# Patient Record
Sex: Female | Born: 1955 | Race: White | Hispanic: No | Marital: Married | Smoking: Former smoker
Health system: Southern US, Community
[De-identification: ages and names within clinical notes are randomized; demographics above are authoritative.]

## PROBLEM LIST (undated history)

## (undated) DIAGNOSIS — J45909 Unspecified asthma, uncomplicated: Secondary | ICD-10-CM

## (undated) DIAGNOSIS — I10 Essential (primary) hypertension: Secondary | ICD-10-CM

## (undated) DIAGNOSIS — N3281 Overactive bladder: Secondary | ICD-10-CM

## (undated) DIAGNOSIS — I2510 Atherosclerotic heart disease of native coronary artery without angina pectoris: Secondary | ICD-10-CM

## (undated) DIAGNOSIS — R112 Nausea with vomiting, unspecified: Secondary | ICD-10-CM

## (undated) DIAGNOSIS — Z9889 Other specified postprocedural states: Secondary | ICD-10-CM

## (undated) DIAGNOSIS — Z8616 Personal history of COVID-19: Secondary | ICD-10-CM

## (undated) DIAGNOSIS — T7840XA Allergy, unspecified, initial encounter: Secondary | ICD-10-CM

## (undated) DIAGNOSIS — I493 Ventricular premature depolarization: Secondary | ICD-10-CM

## (undated) DIAGNOSIS — R12 Heartburn: Secondary | ICD-10-CM

## (undated) DIAGNOSIS — R0600 Dyspnea, unspecified: Secondary | ICD-10-CM

## (undated) DIAGNOSIS — K219 Gastro-esophageal reflux disease without esophagitis: Secondary | ICD-10-CM

## (undated) HISTORY — DX: Atherosclerotic heart disease of native coronary artery without angina pectoris: I25.10

## (undated) HISTORY — DX: Overactive bladder: N32.81

## (undated) HISTORY — PX: TONSILLECTOMY: SUR1361

## (undated) HISTORY — PX: CHOLECYSTECTOMY: SHX55

## (undated) HISTORY — DX: Ventricular premature depolarization: I49.3

## (undated) HISTORY — DX: Personal history of COVID-19: Z86.16

## (undated) HISTORY — DX: Heartburn: R12

## (undated) HISTORY — PX: ABDOMINAL HYSTERECTOMY: SHX81

## (undated) HISTORY — DX: Allergy, unspecified, initial encounter: T78.40XA

## (undated) HISTORY — DX: Essential (primary) hypertension: I10

---

## 2002-08-24 HISTORY — PX: GALLBLADDER SURGERY: SHX652

## 2007-01-25 ENCOUNTER — Ambulatory Visit (HOSPITAL_COMMUNITY): Admission: RE | Admit: 2007-01-25 | Discharge: 2007-01-25 | Payer: Self-pay | Admitting: Family Medicine

## 2007-10-04 ENCOUNTER — Observation Stay (HOSPITAL_COMMUNITY): Admission: AD | Admit: 2007-10-04 | Discharge: 2007-10-05 | Payer: Self-pay

## 2007-10-27 ENCOUNTER — Ambulatory Visit (HOSPITAL_COMMUNITY): Admission: RE | Admit: 2007-10-27 | Discharge: 2007-10-27 | Payer: Self-pay | Admitting: Pulmonary Disease

## 2008-02-02 ENCOUNTER — Ambulatory Visit (HOSPITAL_COMMUNITY): Admission: RE | Admit: 2008-02-02 | Discharge: 2008-02-02 | Payer: Self-pay | Admitting: Family Medicine

## 2008-04-19 ENCOUNTER — Ambulatory Visit (HOSPITAL_COMMUNITY): Admission: RE | Admit: 2008-04-19 | Discharge: 2008-04-19 | Payer: Self-pay | Admitting: Pulmonary Disease

## 2008-08-24 HISTORY — PX: ESOPHAGOGASTRODUODENOSCOPY: SHX1529

## 2008-08-24 HISTORY — PX: COLONOSCOPY: SHX174

## 2008-10-12 ENCOUNTER — Encounter (INDEPENDENT_AMBULATORY_CARE_PROVIDER_SITE_OTHER): Payer: Self-pay | Admitting: General Surgery

## 2008-10-12 ENCOUNTER — Ambulatory Visit (HOSPITAL_COMMUNITY): Admission: RE | Admit: 2008-10-12 | Discharge: 2008-10-12 | Payer: Self-pay | Admitting: General Surgery

## 2009-02-26 ENCOUNTER — Ambulatory Visit (HOSPITAL_COMMUNITY): Admission: RE | Admit: 2009-02-26 | Discharge: 2009-02-26 | Payer: Self-pay | Admitting: Family Medicine

## 2010-03-07 ENCOUNTER — Ambulatory Visit (HOSPITAL_COMMUNITY): Admission: RE | Admit: 2010-03-07 | Discharge: 2010-03-07 | Payer: Self-pay | Admitting: Family Medicine

## 2010-04-18 ENCOUNTER — Ambulatory Visit (HOSPITAL_COMMUNITY)
Admission: RE | Admit: 2010-04-18 | Discharge: 2010-04-18 | Payer: Self-pay | Source: Home / Self Care | Admitting: Family Medicine

## 2010-04-30 ENCOUNTER — Ambulatory Visit: Payer: Self-pay | Admitting: Gastroenterology

## 2010-04-30 DIAGNOSIS — K219 Gastro-esophageal reflux disease without esophagitis: Secondary | ICD-10-CM

## 2010-04-30 DIAGNOSIS — R1011 Right upper quadrant pain: Secondary | ICD-10-CM

## 2010-04-30 DIAGNOSIS — Z91040 Latex allergy status: Secondary | ICD-10-CM

## 2010-05-01 ENCOUNTER — Encounter: Payer: Self-pay | Admitting: Gastroenterology

## 2010-05-21 ENCOUNTER — Ambulatory Visit (HOSPITAL_COMMUNITY)
Admission: RE | Admit: 2010-05-21 | Discharge: 2010-05-21 | Payer: Self-pay | Source: Home / Self Care | Admitting: Gastroenterology

## 2010-05-21 HISTORY — PX: EUS: SHX5427

## 2010-05-26 ENCOUNTER — Encounter (INDEPENDENT_AMBULATORY_CARE_PROVIDER_SITE_OTHER): Payer: Self-pay

## 2010-07-23 ENCOUNTER — Encounter (INDEPENDENT_AMBULATORY_CARE_PROVIDER_SITE_OTHER): Payer: Self-pay | Admitting: *Deleted

## 2010-09-14 ENCOUNTER — Encounter: Payer: Self-pay | Admitting: Family Medicine
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
## 2010-09-24 NOTE — Letter (Signed)
Summary: DR Dulce Sellar REFERRAL  DR OUTLAW REFERRAL   Imported By: Ave Filter 05/01/2010 08:00:12  _____________________________________________________________________  External Attachment:    Type:   Image     Comment:   External Document

## 2010-09-24 NOTE — Miscellaneous (Signed)
Summary: Orders Update  Clinical Lists Changes  Orders: Added new Test order of T-Hepatic Function [phone]) - Signed Added new Test order of T-Lipase [phone]) - Signed

## 2010-09-24 NOTE — Letter (Signed)
Summary: Plan of Care, Need to Discuss  [facility]  [address]   [place], [place] [address]   Phone: [phone]  Fax: [phone]    May 26, 2010  [facility] [address] [place], [place]  [address] January 28, 1956   Dear Ms. Indelicato,   We are writing this letter to inform you of treatment plans and/or discuss your plan of care.  We have tried several times to contact you; however, we have yet to reach you.  We ask that you please contact our office for follow-up on your gastrointestinal issues.  We can  be reached at [phone] to schedule an appointment, or to speak with someone regarding your health care needs.  Please do not neglect your health.   Sincerely,    Cloria Spring LPN  [facility] Ph: [phone]    Fax: [phone]

## 2010-09-24 NOTE — Letter (Signed)
Summary: Recall Office Visit  [facility]  [address]   [place], [place] [address]   Phone: [phone]  Fax: [phone]      July 23, 2010   [facility] [address] [place], [place]  [address] 04-03-1956   Dear [name],   According to our records, it is time for you to schedule a follow-up office visit with [place].   At your convenience, please call [phone] to schedule an office visit. If you have any questions, concerns, or feel that this letter is in error, we would appreciate your call.   Sincerely,    Diana Eves  [facility] Ph: [phone]   Fax: [phone]

## 2010-12-30 ENCOUNTER — Other Ambulatory Visit (HOSPITAL_COMMUNITY): Payer: Self-pay | Admitting: Family Medicine

## 2010-12-30 DIAGNOSIS — Z139 Encounter for screening, unspecified: Secondary | ICD-10-CM

## 2011-01-01 ENCOUNTER — Ambulatory Visit (HOSPITAL_COMMUNITY)
Admission: RE | Admit: 2011-01-01 | Discharge: 2011-01-01 | Disposition: A | Discharge status: Home / Self Care | Payer: 59 | Source: Ambulatory Visit | Attending: Family Medicine | Admitting: Family Medicine

## 2011-01-01 DIAGNOSIS — M899 Disorder of bone, unspecified: Secondary | ICD-10-CM | POA: Insufficient documentation

## 2011-01-01 DIAGNOSIS — M949 Disorder of cartilage, unspecified: Secondary | ICD-10-CM | POA: Insufficient documentation

## 2011-01-01 DIAGNOSIS — Z139 Encounter for screening, unspecified: Secondary | ICD-10-CM

## 2011-01-06 NOTE — H&P (Signed)
[name], [name]               ACCOUNT NO.:  [id number]   MEDICAL RECORD NO.:  [id number]          PATIENT TYPE:  INP   LOCATION:  A220                          FACILITY:  [facility]   PHYSICIAN:  Dorris Singh, DO    DATE OF BIRTH:  August 18, 1956   DATE OF ADMISSION:  10/04/2007  DATE OF DISCHARGE:  LH                              HISTORY & PHYSICAL   The patient is a 55 year old Caucasian female who presented to her  primary care physician's office, Dr. Phillips Odor at [facility], with a 7-day history of cold-like symptoms.  She says that  she has been taking over-the-counter medications and felt after a week  that her symptoms were getting worse.  She was having maxillary sinus  pain that radiates to both sides of the jaw, also runny nose and a  headache.  She stated that last night while she was at home, she noticed  a crushing chest pain rated a 9/10, lasting only for a few minutes  intermittently, waxing and waning. She denies any nausea, vomiting or  diaphoresis but stated that it lasted from 3:00 a.m. to 6:00 a.m.  At  that point in time, she went to sleep and then woke up and called Dr.  Phillips Odor and was seen by him. Dr. Phillips Odor presented the patient to me  today. After speaking with Dr. Rosalia Hammers in the ED, decided that she has a few  issues going on.  One, she may have a bronchitis or sinusitis which she  has suffered with for over 10 days and also to find out what is going on  with her chest pain, is this truly cardiac in nature.  So the plan would  be to admit her to the service of [facility] directly and obtain her  studies this way and have [facility] see her in the morning  if not more emergently.   PAST MEDICAL HISTORY:  Significant for incontinence.   ALLERGIES:  No known drug allergies.   PAST SURGICAL HISTORY:  Significant for:  1. Tonsillectomy.  2. Complete hysterectomy.  3. Cholecystectomy.   SOCIAL HISTORY:  She smokes about a pack to a  pack and one-half per day.  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   MEDICATIONS:  1. Premarin, she does not give [place] a dose.  2. Detrol LA 4 mg.  3. Multivitamins.  4. Citrucel.  5. Osteo-Flex.  No doses for these things.   REVIEW OF SYSTEMS:  Positive for rhinorrhea, rhinitis, jaw pain, cough,  chest pain, headache, weakness, incontinence.   PHYSICAL EXAMINATION:  VITAL SIGNS:  Temperature is 97.4, pulse 70,  respirations 20, blood pressure 142/74.  GENERAL:  This is a well-developed, well-nourished 55 year old woman who  is in no acute distress.  HEENT:  Head is normocephalic, atraumatic.  Eyes: PERRLA.  EOMI.  No  sclera icterus or conjunctival injection.  Nose: Turbinates are moist.  Mouth:  No erythema or exudate noted.  Sinuses: There is pressure with  palpation of the maxillary sinus.  NECK:  Supple.  No lymphadenopathy or tracheal  deviation.  HEART:  Regular rate and rhythm.  LUNGS:  Clear to auscultation bilaterally.  No wheezes, rales or  rhonchi.  ABDOMEN:  Soft, nontender, nondistended. Bowel sounds in all four  quadrants  EXTREMITIES:  Positive pulses.  No ecchymosis, cyanosis, or edema.   LABORATORY DATA:  Her current labs are as follows. Her CBC is within  normal limits.  Her chemistries within normal limits.  However, her  LFTs, alkaline phosphatase is 118, AST 104, ALT 108.  Her first set of  cardiac enzymes are normal.   ASSESSMENT AND PLAN:  1. Bronchitis and sinusitis.  2. Chest pain, atypical.  3. Elevated liver enzymes.   PLAN:  The patient is a direct admit from Dr. [name].  Currently his orders include starting the patient on IV Avelox.  We will  go ahead and continue that for 24 hours.  Currently the patient does not  have a white count, but she is exhibiting symptoms of bronchitis and  sinusitis, so we will continue with that plan for at least the next 24  hours.  Will go ahead and continue to monitor  her cardiac enzymes.  There is an EKG ordered that is pending as well as one in the morning,  and, if anything is changed, will contact [facility] for  emergent evaluation.   Elevated liver enzymes.  Will go ahead and repeat those in the morning  to see how she does. If there is any reason to pursue an inpatient  workup, will continue with that. If not, will possibly have an  outpatient workup. The patient has been placed on DVT and GI  prophylaxis. Will place her on her home medications and will continue to  monitor and change therapy as needed.      Dorris Singh, DO  Electronically Signed     CB/MEDQ  D:  10/04/2007  T:  10/04/2007  Job:  [id number]

## 2011-01-06 NOTE — H&P (Signed)
[name], [name]               ACCOUNT NO.:  [id number]   MEDICAL RECORD NO.:  [id number]          PATIENT TYPE:  AMB   LOCATION:  DAY                           FACILITY:  [facility]   PHYSICIAN:  Dalia Heading, M.D.  DATE OF BIRTH:  [birth year]   DATE OF ADMISSION:  DATE OF DISCHARGE:  LH                              HISTORY & PHYSICAL   CHIEF COMPLAINT:  Need for screening colonoscopy, GERD.   HISTORY OF PRESENT ILLNESS:  The patient is a 56 year old white female  who was referred for endoscopic evaluation.  She needs a colonoscopy for  screening purposes and an EGD for intractable GERD.  Nexium has been  somewhat helpful and she takes this most of the day.  It has been  present for many years.  No abdominal pain, weight loss, nausea,  vomiting, diarrhea, constipation, melena, or hematochezia.  She has  never had a colonoscopy.  There is no family history of colon carcinoma.   PAST MEDICAL HISTORY:  Bladder dysfunction and GERD.   PAST SURGICAL HISTORY:  Hysterectomy, cholecystectomy, and ankle  surgery.   CURRENT MEDICATIONS:  Detrol LA, Nexium, Nasonex spray, and calcium  supplements.   ALLERGIES:  No known drug allergies.   REVIEW OF SYSTEMS:  Noncontributory.   PHYSICAL EXAMINATION:  GENERAL:  The patient is a well-developed, well-  nourished white female in no acute distress.  LUNGS:  Clear to auscultation with equal breath sounds bilaterally.  HEART:  Regular rate and rhythm without S3, S4, or murmurs.  ABDOMEN:  Soft, nontender, and nondistended.  No hepatosplenomegaly or  masses are noted.  RECTAL:  Deferred to the procedure.   IMPRESSION:  Gastroesophageal reflux disease and need for screening  colonoscopy.   PLAN:  The patient is scheduled for an EGD and colonoscopy on October 12, 2008.  The risks and benefits of the procedure including bleeding  and perforation were fully explained to the patient, gave informed  consent.      Dalia Heading, M.D.  Electronically Signed     MAJ/MEDQ  D:  09/20/2008  T:  09/21/2008  Job:  [id number]   cc:   Corrie Mckusick, M.D.  Fax: [phone]

## 2011-01-06 NOTE — H&P (Signed)
[name], [name]               ACCOUNT NO.:  [id number]   MEDICAL RECORD NO.:  [id number]          PATIENT TYPE:  AMB   LOCATION:  DAY                           [facility]ILITY:  [facility]   PHYSICIAN:  Dalia Heading, M.D.  DATE OF BIRTH:  July 06, 1956   DATE OF ADMISSION:  DATE OF DISCHARGE:  LH                              HISTORY & PHYSICAL   CHIEF COMPLAINT:  Need for screening colonoscopy, GERD.   HISTORY OF PRESENT ILLNESS:  The patient is a 55 year old white female  who was referred for endoscopic evaluation.  She needs a colonoscopy for  screening purposes and an EGD for intractable GERD.  Nexium has been  somewhat helpful and she takes this most of the day.  It has been  present for many years.  No abdominal pain, weight loss, nausea,  vomiting, diarrhea, constipation, melena, or hematochezia.  She has  never had a colonoscopy.  There is no family history of colon carcinoma.   PAST MEDICAL HISTORY:  Bladder dysfunction and GERD.   PAST SURGICAL HISTORY:  Hysterectomy, cholecystectomy, and ankle  surgery.   CURRENT MEDICATIONS:  Detrol LA, Nexium, Nasonex spray, and calcium  supplements.   ALLERGIES:  No known drug allergies.   REVIEW OF SYSTEMS:  Noncontributory.   PHYSICAL EXAMINATION:  GENERAL:  The patient is a well-developed, well-  nourished white female in no acute distress.  LUNGS:  Clear to auscultation with equal breath sounds bilaterally.  HEART:  Regular rate and rhythm without S3, S4, or murmurs.  ABDOMEN:  Soft, nontender, and nondistended.  No hepatosplenomegaly or  masses are noted.  RECTAL:  Deferred to the procedure.   IMPRESSION:  Gastroesophageal reflux disease and need for screening  colonoscopy.   PLAN:  The patient is scheduled for an EGD and colonoscopy on October 12, 2008.  The risks and benefits of the procedure including bleeding  and perforation were fully explained to the patient, gave informed  consent.      Dalia Heading,  M.D.     MAJ/MEDQ  D:  09/20/2008  T:  09/21/2008  Job:  [id number]   cc:   Corrie Mckusick, M.D.  Fax: [phone]

## 2011-01-06 NOTE — Discharge Summary (Signed)
[name], [name]               ACCOUNT NO.:  [id number]   MEDICAL RECORD NO.:  [id number]          PATIENT TYPE:  INP   LOCATION:  A220                          FACILITY:  [facility]   PHYSICIAN:  Dorris Singh, DO    DATE OF BIRTH:  1956-04-30   DATE OF ADMISSION:  10/04/2007  DATE OF DISCHARGE:  02/11/2009LH                               DISCHARGE SUMMARY   ADMISSION DIAGNOSES:  1. Bronchitis, sinusitis,.  2. Chest pain.  3. Elevated liver enzymes.   DISCHARGE DIAGNOSES:  1. Sinusitis.  2. Elevated liver enzymes.  3. Abnormal chest CT.  4. Chest pain, atypical.   CONSULTATIONS:  Dr. [facility] for chest pain.   STUDIES:  CT angiogram on October 04, 2007.   IMPRESSION:  No evidence of pulmonary embolus or acute thoracic process.  COPD with few scattered tiny nodule foci and minimal foci of infiltrates  in both lungs as above.  The largest nodule 5 mm in diameter right  middle lobe.  Follow-up CT recommended in 6-9 months to confirm  stability in this smoker.   HISTORY AND PHYSICAL:  The patient's H&P was done by Dr. Dorris Singh.  Please refer, but to summarize, the patient is a 51-year woman  who presented Dr. [name] with a history of atypical chest  pain.  It was determined that she would go ahead and be admitted.  She  also had some other symptoms of an upper respiratory infection.  She was  then placed on the monitor for 24 hours.  Dr. Domingo Sep came to see the  patient.  Prior to being seen, she did not have any further episodes.  She also was scheduled for a CT with the above findings.  Dr. Domingo Sep  saw her today.  Followed her blood work and cardiac enzymes have been  negative except for elevated liver enzymes which were trending down.  At  this point in time, after discussion with Dr. Domingo Sep today that the  patient could be discharged to home, but after discharge will have her  come to [facility] and set up a  stress test for the next  few days.  Also, it was recommended to the patient due to her CT and her  chronic smoking, that she be followed up by Dr. Juanetta Gosling outpatient and  recommended the patient stop smoking at this point in time.   DISCHARGE MEDICATIONS:  She did not give a doses of her medications, so  we have:  1. Premarin with no dose.  2. Detrol LA 4 mg one p.o. daily.  3. Multivitamin.  4. Citracal.  5. __________.  6. She has new medications which include.      a.     Lopressor 12.5 mg p.o. b.i.d.      b.     Avelox 400 mg p.o. daily.      c.     Maxifed one p.o. b.i.d.      d.     Hycodan one teaspoon p.o. q. 4-6 hours p.r.n. cough.   FOLLOW-UP INSTRUCTIONS:  She is to increase her  activity slowly.  She is  to be on a low heart healthy diet.  She is to stop smoking.  She is to  see Dr. Domingo Sep now for stress test, also to follow up with Dr. Juanetta Gosling  for COPD and with Dr. Phillips Odor for sinusitis and elevated liver enzymes  and to start her new medications as recommended      Dorris Singh, DO  Electronically Signed     CB/MEDQ  D:  10/05/2007  T:  10/05/2007  Job:  [id number]

## 2011-01-09 NOTE — Procedures (Signed)
[name], [name]               ACCOUNT NO.:  [id number]   MEDICAL RECORD NO.:  [id number]          PATIENT TYPE:  OUT   LOCATION:  RESP                          FACILITY:  [facility]   PHYSICIAN:  Edward L. Juanetta Gosling, M.D.DATE OF BIRTH:  29-Jan-1956   DATE OF PROCEDURE:  DATE OF DISCHARGE:                            PULMONARY FUNCTION TEST   1. Spirometry shows no ventilatory defect but does show some evidence      of airflow obstruction, most marked in the smaller airways.  2. Lung volumes are normal.  3. DLCO is normal.  4. Arterial blood gases normal.  5. There is no significant bronchodilator response.      Edward L. Juanetta Gosling, M.D.  Electronically Signed     ELH/MEDQ  D:  11/02/2007  T:  11/03/2007  Job:  [id number]

## 2011-02-06 ENCOUNTER — Other Ambulatory Visit (HOSPITAL_COMMUNITY): Payer: Self-pay | Admitting: Internal Medicine

## 2011-02-11 ENCOUNTER — Encounter (HOSPITAL_COMMUNITY): Payer: 59

## 2011-04-28 ENCOUNTER — Other Ambulatory Visit (HOSPITAL_COMMUNITY): Payer: Self-pay | Admitting: Family Medicine

## 2011-04-28 DIAGNOSIS — Z139 Encounter for screening, unspecified: Secondary | ICD-10-CM

## 2011-04-30 ENCOUNTER — Ambulatory Visit (HOSPITAL_COMMUNITY)
Admission: RE | Admit: 2011-04-30 | Discharge: 2011-04-30 | Disposition: A | Discharge status: Home / Self Care | Payer: 59 | Source: Ambulatory Visit | Attending: Family Medicine | Admitting: Family Medicine

## 2011-04-30 DIAGNOSIS — Z1231 Encounter for screening mammogram for malignant neoplasm of breast: Secondary | ICD-10-CM | POA: Insufficient documentation

## 2011-04-30 DIAGNOSIS — Z139 Encounter for screening, unspecified: Secondary | ICD-10-CM

## 2011-05-05 ENCOUNTER — Ambulatory Visit (INDEPENDENT_AMBULATORY_CARE_PROVIDER_SITE_OTHER): Payer: 59 | Admitting: Urology

## 2011-05-05 DIAGNOSIS — N393 Stress incontinence (female) (male): Secondary | ICD-10-CM

## 2011-05-15 LAB — CARDIAC PANEL(CRET KIN+CKTOT+MB+TROPI)
CK, MB: 0.9
CK, MB: 1.5
Total CK: 66
Troponin I: 0.03
Troponin I: 0.03
Troponin I: 0.03

## 2011-05-15 LAB — COMPREHENSIVE METABOLIC PANEL
ALT: 108 — ABNORMAL HIGH
Alkaline Phosphatase: 117
Alkaline Phosphatase: 118 — ABNORMAL HIGH
BUN: 11
Calcium: 8.9
Calcium: 8.9
Chloride: 106
Chloride: 107
Creatinine, Ser: 0.67
Creatinine, Ser: 0.86
GFR calc non Af Amer: >60
Glucose, Bld: 87
Glucose, Bld: 97
Potassium: 4.1
Total Bilirubin: 0.4
Total Bilirubin: 0.5
Total Protein: 5.7 — ABNORMAL LOW

## 2011-05-15 LAB — DIFFERENTIAL
Basophils Relative: 0
Basophils Relative: 0
Eosinophils Absolute: 0.2
Eosinophils Absolute: 0.3
Eosinophils Relative: 3
Eosinophils Relative: 4
Lymphocytes Relative: 20
Lymphs Abs: 1.4
Lymphs Abs: 1.6
Monocytes Absolute: 0.7
Neutro Abs: 4.7
Neutro Abs: 5.4
Neutrophils Relative %: 68
Neutrophils Relative %: 68

## 2011-05-15 LAB — CBC
Hemoglobin: 15.2 — ABNORMAL HIGH
MCV: 89.4
Platelets: 204
RBC: 4.85
RDW: 12.8

## 2011-05-15 LAB — LIPID PANEL
Cholesterol: 190
Triglycerides: 114

## 2011-05-18 LAB — BLOOD GAS, ARTERIAL
Acid-Base Excess: 1.1
TCO2: 22
pCO2 arterial: 40.4

## 2011-08-04 ENCOUNTER — Ambulatory Visit (INDEPENDENT_AMBULATORY_CARE_PROVIDER_SITE_OTHER): Payer: 59 | Admitting: Urology

## 2011-08-04 DIAGNOSIS — R350 Frequency of micturition: Secondary | ICD-10-CM

## 2011-08-04 DIAGNOSIS — N393 Stress incontinence (female) (male): Secondary | ICD-10-CM

## 2011-08-04 DIAGNOSIS — R3915 Urgency of urination: Secondary | ICD-10-CM

## 2011-09-16 ENCOUNTER — Other Ambulatory Visit (HOSPITAL_COMMUNITY): Payer: Self-pay | Admitting: Family Medicine

## 2011-09-16 DIAGNOSIS — R109 Unspecified abdominal pain: Secondary | ICD-10-CM

## 2011-09-16 DIAGNOSIS — N200 Calculus of kidney: Secondary | ICD-10-CM

## 2011-09-18 ENCOUNTER — Inpatient Hospital Stay (HOSPITAL_COMMUNITY): Admission: RE | Admit: 2011-09-18 | Payer: 59 | Source: Ambulatory Visit

## 2011-09-21 ENCOUNTER — Ambulatory Visit (HOSPITAL_COMMUNITY)
Admission: RE | Admit: 2011-09-21 | Discharge: 2011-09-21 | Disposition: A | Discharge status: Home / Self Care | Payer: 59 | Source: Ambulatory Visit | Attending: Family Medicine | Admitting: Family Medicine

## 2011-09-21 DIAGNOSIS — R112 Nausea with vomiting, unspecified: Secondary | ICD-10-CM | POA: Insufficient documentation

## 2011-09-21 DIAGNOSIS — K7689 Other specified diseases of liver: Secondary | ICD-10-CM | POA: Insufficient documentation

## 2011-09-21 DIAGNOSIS — N200 Calculus of kidney: Secondary | ICD-10-CM

## 2011-09-21 DIAGNOSIS — R1032 Left lower quadrant pain: Secondary | ICD-10-CM | POA: Insufficient documentation

## 2011-09-21 DIAGNOSIS — R109 Unspecified abdominal pain: Secondary | ICD-10-CM

## 2012-01-19 ENCOUNTER — Ambulatory Visit: Payer: 59 | Admitting: Urology

## 2012-04-27 ENCOUNTER — Other Ambulatory Visit (HOSPITAL_COMMUNITY): Payer: Self-pay | Admitting: Family Medicine

## 2012-04-27 DIAGNOSIS — J984 Other disorders of lung: Secondary | ICD-10-CM

## 2012-04-29 ENCOUNTER — Other Ambulatory Visit (HOSPITAL_COMMUNITY): Payer: 59

## 2012-05-02 ENCOUNTER — Other Ambulatory Visit (HOSPITAL_COMMUNITY): Payer: Self-pay | Admitting: Family Medicine

## 2012-05-02 ENCOUNTER — Ambulatory Visit (HOSPITAL_COMMUNITY)
Admission: RE | Admit: 2012-05-02 | Discharge: 2012-05-02 | Disposition: A | Discharge status: Home / Self Care | Payer: 59 | Source: Ambulatory Visit | Attending: Family Medicine | Admitting: Family Medicine

## 2012-05-02 DIAGNOSIS — J984 Other disorders of lung: Secondary | ICD-10-CM | POA: Insufficient documentation

## 2012-05-02 DIAGNOSIS — Z139 Encounter for screening, unspecified: Secondary | ICD-10-CM

## 2012-05-06 ENCOUNTER — Ambulatory Visit (HOSPITAL_COMMUNITY)
Admission: RE | Admit: 2012-05-06 | Discharge: 2012-05-06 | Disposition: A | Discharge status: Home / Self Care | Payer: 59 | Source: Ambulatory Visit | Attending: Family Medicine | Admitting: Family Medicine

## 2012-05-06 DIAGNOSIS — Z139 Encounter for screening, unspecified: Secondary | ICD-10-CM

## 2012-05-06 DIAGNOSIS — Z1231 Encounter for screening mammogram for malignant neoplasm of breast: Secondary | ICD-10-CM | POA: Insufficient documentation

## 2013-07-13 ENCOUNTER — Other Ambulatory Visit (HOSPITAL_COMMUNITY): Payer: Self-pay | Admitting: Family Medicine

## 2013-07-13 DIAGNOSIS — Z139 Encounter for screening, unspecified: Secondary | ICD-10-CM

## 2013-07-18 ENCOUNTER — Ambulatory Visit (HOSPITAL_COMMUNITY)
Admission: RE | Admit: 2013-07-18 | Discharge: 2013-07-18 | Disposition: A | Discharge status: Home / Self Care | Payer: 59 | Source: Ambulatory Visit | Attending: Family Medicine | Admitting: Family Medicine

## 2013-07-18 DIAGNOSIS — Z1231 Encounter for screening mammogram for malignant neoplasm of breast: Secondary | ICD-10-CM | POA: Insufficient documentation

## 2013-07-18 DIAGNOSIS — Z139 Encounter for screening, unspecified: Secondary | ICD-10-CM

## 2013-12-04 ENCOUNTER — Other Ambulatory Visit (HOSPITAL_COMMUNITY): Payer: Self-pay | Admitting: Family Medicine

## 2013-12-04 ENCOUNTER — Ambulatory Visit (HOSPITAL_COMMUNITY)
Admission: RE | Admit: 2013-12-04 | Discharge: 2013-12-04 | Disposition: A | Discharge status: Home / Self Care | Payer: 59 | Source: Ambulatory Visit | Attending: Family Medicine | Admitting: Family Medicine

## 2013-12-04 DIAGNOSIS — R079 Chest pain, unspecified: Secondary | ICD-10-CM | POA: Insufficient documentation

## 2013-12-04 DIAGNOSIS — R05 Cough: Secondary | ICD-10-CM

## 2013-12-04 DIAGNOSIS — R059 Cough, unspecified: Secondary | ICD-10-CM | POA: Insufficient documentation

## 2013-12-04 DIAGNOSIS — Z87891 Personal history of nicotine dependence: Secondary | ICD-10-CM | POA: Insufficient documentation

## 2013-12-27 ENCOUNTER — Other Ambulatory Visit (HOSPITAL_COMMUNITY): Payer: Self-pay | Admitting: Family Medicine

## 2013-12-27 DIAGNOSIS — R9389 Abnormal findings on diagnostic imaging of other specified body structures: Secondary | ICD-10-CM

## 2013-12-29 ENCOUNTER — Other Ambulatory Visit (HOSPITAL_COMMUNITY): Payer: 59

## 2014-01-02 ENCOUNTER — Ambulatory Visit (HOSPITAL_COMMUNITY)
Admission: RE | Admit: 2014-01-02 | Discharge: 2014-01-02 | Disposition: A | Discharge status: Home / Self Care | Payer: 59 | Source: Ambulatory Visit | Attending: Family Medicine | Admitting: Family Medicine

## 2014-01-02 DIAGNOSIS — J438 Other emphysema: Secondary | ICD-10-CM | POA: Insufficient documentation

## 2014-01-02 DIAGNOSIS — K449 Diaphragmatic hernia without obstruction or gangrene: Secondary | ICD-10-CM | POA: Insufficient documentation

## 2014-01-02 DIAGNOSIS — Z87891 Personal history of nicotine dependence: Secondary | ICD-10-CM | POA: Insufficient documentation

## 2014-01-02 DIAGNOSIS — J40 Bronchitis, not specified as acute or chronic: Secondary | ICD-10-CM | POA: Insufficient documentation

## 2014-01-02 DIAGNOSIS — R911 Solitary pulmonary nodule: Secondary | ICD-10-CM | POA: Insufficient documentation

## 2014-01-02 DIAGNOSIS — K7689 Other specified diseases of liver: Secondary | ICD-10-CM | POA: Insufficient documentation

## 2014-01-02 DIAGNOSIS — R9389 Abnormal findings on diagnostic imaging of other specified body structures: Secondary | ICD-10-CM

## 2014-01-08 ENCOUNTER — Encounter: Payer: Self-pay | Admitting: Emergency Medicine

## 2014-01-08 ENCOUNTER — Ambulatory Visit (INDEPENDENT_AMBULATORY_CARE_PROVIDER_SITE_OTHER): Payer: 59 | Admitting: Emergency Medicine

## 2014-01-08 VITALS — BP 140/86 | HR 83 | Ht 64.5 in | Wt 177.0 lb

## 2014-01-08 DIAGNOSIS — R9389 Abnormal findings on diagnostic imaging of other specified body structures: Secondary | ICD-10-CM | POA: Insufficient documentation

## 2014-01-08 DIAGNOSIS — J479 Bronchiectasis, uncomplicated: Secondary | ICD-10-CM | POA: Insufficient documentation

## 2014-01-08 NOTE — Progress Notes (Signed)
Subjective:    Patient ID: [name], female    DOB: 08-07-1956, 58 y.o.   MRN: [id number]  HPI 58 yo former former smoker, hx of HTN, GERD, allergies. She is referred today for an abnormal CT scan of the chest, most recently on 01/02/14. Shows mild peribronchial thickening w some scattered peripheral interstitial changes, microvascular nodular changes, mild bronchiectasis, RML basilar scarring, stable RML nodule. Very little change compared with prior scan on 05/02/12 but more so than 2009. She has allergies with sensitivity to grasses, pollens.    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 No history of auto-immune disease. Distant relative had SLE.  She was dx with PNA and bronchitis frequently as a youngster.   Spirometry 2012 at [facility] > AFL especially small airways.    Review of Systems  Constitutional: Negative for fever and unexpected weight change.  HENT: Positive for congestion, sinus pressure, sneezing and sore throat. Negative for dental problem, ear pain, nosebleeds, postnasal drip, rhinorrhea and trouble swallowing.   Eyes: Negative for redness and itching.  Respiratory: Positive for cough and shortness of breath. Negative for chest tightness and wheezing.   Cardiovascular: Negative for palpitations and leg swelling.  Gastrointestinal: Positive for abdominal pain and abdominal distention. Negative for nausea and vomiting.  Genitourinary: Negative for dysuria.  Musculoskeletal: Negative for joint swelling.  Skin: Negative for rash.  Neurological: Negative for headaches.  Hematological: Does not bruise/bleed easily.  Psychiatric/Behavioral: Negative for dysphoric mood. The patient is not nervous/anxious.    Past Medical History  Diagnosis Date  . High blood pressure   . Allergy   . Heartburn      Family History  Problem  Relation Age of Onset  . Heart disease Maternal Grandfather   . Heart disease Paternal Grandmother   . Cancer Mother     lyphoma  . Allergies Mother   . Allergies Brother      History   Social History  . Marital Status: Married    Spouse Name: N/A    Number of Children: N/A  . Years of Education: N/A   Occupational History  . Not on file.   Social History Main Topics  . Smoking status: Former Smoker -- 1.50 packs/day for 30 years    Types: Cigarettes    Quit date: 05/24/2009  . Smokeless tobacco: Not on file  . Alcohol Use: No  . Drug Use: No  . Sexual Activity: Not on file   Other Topics Concern  . Not on file   Social History Narrative  . No narrative on file     Allergies  Allergen Reactions  . Latex   . Prednisone     Vomit & extreme headache     No outpatient prescriptions prior to visit.   No facility-administered medications prior to visit.        Objective:   Physical Exam  Filed Vitals:   01/08/14 1541  BP: 140/86  Pulse: 83  Height: 5' 4.5" (1.638 m)  Weight: 177 lb (80.287 kg)  SpO2: 96%   Gen: Pleasant, well-nourished, in no distress,  normal affect  ENT: No lesions,  mouth clear,  oropharynx clear, no postnasal drip  Neck: No JVD, no TMG, no carotid bruits  Lungs: No use of accessory muscles, clear without rales or rhonchi  Cardiovascular: RRR, heart sounds normal, no murmur or gallops, no peripheral edema  Musculoskeletal: No deformities, no cyanosis or clubbing  Neuro: alert, non focal  Skin: Warm, no lesions or rashes      Assessment & Plan:  Abnormal CT scan, chest Micronodular disease with some bronchiectasis, RML scar, stable RML 62mm nodule. 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 Will start the eval with FOB and BAL. Will likely need PFT to compare with 2012. May need to consider auto-immune workup depending on the cx results.  - will set up FOB for 5/27 or 28

## 2014-01-08 NOTE — Assessment & Plan Note (Signed)
Micronodular disease with some bronchiectasis, RML scar, stable RML 71mm nodule. 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 Will start the eval with FOB and BAL. Will likely need PFT to compare with 2012. May need to consider auto-immune workup depending on the cx results.  - will set up FOB for 5/27 or 28

## 2014-01-08 NOTE — Patient Instructions (Signed)
We will perform a bronchoscopy to allow cultures of your lungs.  We will review your former breathing tests and repeat PFT to compare.  Follow with Dr Lamonte Sakai in 1 month

## 2014-01-17 ENCOUNTER — Telehealth: Payer: Self-pay | Admitting: *Deleted

## 2014-01-17 NOTE — Telephone Encounter (Signed)
Spoke with the pt and notified of appt time change  She verbalized understanding and will keep this appt

## 2014-01-17 NOTE — Telephone Encounter (Signed)
Bronchoscopy 01/23/14 appt time changed to 10am Original appt time was for 730am--Per Dr Lamonte Sakai, time changed.   LMOM x 1 for pt to return call to make aware.

## 2014-01-18 NOTE — Telephone Encounter (Signed)
Pt aware of changes in appointment time per RB. RB has placed change of time in his phone. Nothing further is needed

## 2014-01-23 ENCOUNTER — Ambulatory Visit (HOSPITAL_COMMUNITY): Admission: RE | Admit: 2014-01-23 | Payer: 59 | Source: Ambulatory Visit | Admitting: Emergency Medicine

## 2014-01-23 ENCOUNTER — Encounter (HOSPITAL_COMMUNITY): Admission: RE | Payer: Self-pay | Source: Ambulatory Visit

## 2014-01-23 ENCOUNTER — Encounter (HOSPITAL_COMMUNITY): Payer: 59

## 2014-01-23 ENCOUNTER — Encounter (HOSPITAL_COMMUNITY): Payer: Self-pay | Admitting: Respiratory Therapy

## 2014-01-23 ENCOUNTER — Encounter (HOSPITAL_COMMUNITY)
Admission: RE | Disposition: A | Discharge status: Home / Self Care | Payer: Self-pay | Source: Ambulatory Visit | Attending: Emergency Medicine

## 2014-01-23 ENCOUNTER — Ambulatory Visit (HOSPITAL_COMMUNITY)
Admission: RE | Admit: 2014-01-23 | Discharge: 2014-01-23 | Disposition: A | Discharge status: Home / Self Care | Payer: 59 | Source: Ambulatory Visit | Attending: Emergency Medicine | Admitting: Emergency Medicine

## 2014-01-23 DIAGNOSIS — Z91040 Latex allergy status: Secondary | ICD-10-CM | POA: Diagnosis not present

## 2014-01-23 DIAGNOSIS — R918 Other nonspecific abnormal finding of lung field: Secondary | ICD-10-CM | POA: Insufficient documentation

## 2014-01-23 DIAGNOSIS — Z87891 Personal history of nicotine dependence: Secondary | ICD-10-CM | POA: Insufficient documentation

## 2014-01-23 DIAGNOSIS — R9389 Abnormal findings on diagnostic imaging of other specified body structures: Secondary | ICD-10-CM

## 2014-01-23 DIAGNOSIS — J479 Bronchiectasis, uncomplicated: Secondary | ICD-10-CM | POA: Diagnosis not present

## 2014-01-23 DIAGNOSIS — Z888 Allergy status to other drugs, medicaments and biological substances status: Secondary | ICD-10-CM | POA: Diagnosis not present

## 2014-01-23 HISTORY — PX: VIDEO BRONCHOSCOPY: SHX5072

## 2014-01-23 SURGERY — VIDEO BRONCHOSCOPY WITHOUT FLUORO
Anesthesia: Moderate Sedation | Laterality: Bilateral

## 2014-01-23 MED ORDER — SODIUM CHLORIDE 0.9 % IV SOLN
INTRAVENOUS | Status: DC
Start: 2014-01-23 — End: 2014-01-23
  Administered 2014-01-23: 10:00:00 via INTRAVENOUS

## 2014-01-23 MED ORDER — FENTANYL CITRATE 0.05 MG/ML IJ SOLN
INTRAMUSCULAR | Status: AC
  Filled 2014-01-23: qty 4

## 2014-01-23 MED ORDER — LIDOCAINE HCL 2 % EX GEL
CUTANEOUS | Status: DC | PRN
  Administered 2014-01-23: 2

## 2014-01-23 MED ORDER — FENTANYL CITRATE 0.05 MG/ML IJ SOLN
INTRAMUSCULAR | Status: DC | PRN
  Administered 2014-01-23 (×2): 25 ug via INTRAVENOUS
  Administered 2014-01-23: 50 ug via INTRAVENOUS

## 2014-01-23 MED ORDER — PHENYLEPHRINE HCL 0.25 % NA SOLN: 1.0000 | Freq: Four times a day (QID) | NASAL | Status: DC | PRN

## 2014-01-23 MED ORDER — LIDOCAINE HCL 2 % EX GEL: Freq: Once | CUTANEOUS | Status: DC

## 2014-01-23 MED ORDER — MIDAZOLAM HCL 10 MG/2ML IJ SOLN
INTRAMUSCULAR | Status: AC
  Filled 2014-01-23: qty 4

## 2014-01-23 MED ORDER — LIDOCAINE HCL 1 % IJ SOLN
INTRAMUSCULAR | Status: DC | PRN
  Administered 2014-01-23: 5 mL

## 2014-01-23 MED ORDER — MIDAZOLAM HCL 10 MG/2ML IJ SOLN
INTRAMUSCULAR | Status: DC | PRN
Start: 2014-01-23 — End: 2014-01-23
  Administered 2014-01-23: 2 mg via INTRAVENOUS
  Administered 2014-01-23: 3 mg via INTRAVENOUS

## 2014-01-23 MED ORDER — PHENYLEPHRINE HCL 0.25 % NA SOLN
NASAL | Status: DC | PRN
  Administered 2014-01-23: 2 via NASAL

## 2014-01-23 NOTE — H&P (View-Only) (Signed)
Subjective:    Patient ID: [name], female    DOB: 08-07-1956, 58 y.o.   MRN: [id number]  HPI 58 yo former former smoker, hx of HTN, GERD, allergies. She is referred today for an abnormal CT scan of the chest, most recently on 01/02/14. Shows mild peribronchial thickening w some scattered peripheral interstitial changes, microvascular nodular changes, mild bronchiectasis, RML basilar scarring, stable RML nodule. Very little change compared with prior scan on 05/02/12 but more so than 2009. She has allergies with sensitivity to grasses, pollens.    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 No history of auto-immune disease. Distant relative had SLE.  She was dx with PNA and bronchitis frequently as a youngster.   Spirometry 2012 at [facility] > AFL especially small airways.    Review of Systems  Constitutional: Negative for fever and unexpected weight change.  HENT: Positive for congestion, sinus pressure, sneezing and sore throat. Negative for dental problem, ear pain, nosebleeds, postnasal drip, rhinorrhea and trouble swallowing.   Eyes: Negative for redness and itching.  Respiratory: Positive for cough and shortness of breath. Negative for chest tightness and wheezing.   Cardiovascular: Negative for palpitations and leg swelling.  Gastrointestinal: Positive for abdominal pain and abdominal distention. Negative for nausea and vomiting.  Genitourinary: Negative for dysuria.  Musculoskeletal: Negative for joint swelling.  Skin: Negative for rash.  Neurological: Negative for headaches.  Hematological: Does not bruise/bleed easily.  Psychiatric/Behavioral: Negative for dysphoric mood. The patient is not nervous/anxious.    Past Medical History  Diagnosis Date  . High blood pressure   . Allergy   . Heartburn      Family History  Problem  Relation Age of Onset  . Heart disease Maternal Grandfather   . Heart disease Paternal Grandmother   . Cancer Mother     lyphoma  . Allergies Mother   . Allergies Brother      History   Social History  . Marital Status: Married    Spouse Name: N/A    Number of Children: N/A  . Years of Education: N/A   Occupational History  . Not on file.   Social History Main Topics  . Smoking status: Former Smoker -- 1.50 packs/day for 30 years    Types: Cigarettes    Quit date: 05/24/2009  . Smokeless tobacco: Not on file  . Alcohol Use: No  . Drug Use: No  . Sexual Activity: Not on file   Other Topics Concern  . Not on file   Social History Narrative  . No narrative on file     Allergies  Allergen Reactions  . Latex   . Prednisone     Vomit & extreme headache     No outpatient prescriptions prior to visit.   No facility-administered medications prior to visit.        Objective:   Physical Exam  Filed Vitals:   01/08/14 1541  BP: 140/86  Pulse: 83  Height: 5' 4.5" (1.638 m)  Weight: 177 lb (80.287 kg)  SpO2: 96%   Gen: Pleasant, well-nourished, in no distress,  normal affect  ENT: No lesions,  mouth clear,  oropharynx clear, no postnasal drip  Neck: No JVD, no TMG, no carotid bruits  Lungs: No use of accessory muscles, clear without rales or rhonchi  Cardiovascular: RRR, heart sounds normal, no murmur or gallops, no peripheral edema  Musculoskeletal: No deformities, no cyanosis or clubbing  Neuro: alert, non focal  Skin: Warm, no lesions or rashes      Assessment & Plan:  Abnormal CT scan, chest Micronodular disease with some bronchiectasis, RML scar, stable RML 56mm nodule. 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 Will start the eval with FOB and BAL. Will likely need PFT to compare with 2012. May need to consider auto-immune workup depending on the cx results.  - will set up FOB for 5/27 or 28

## 2014-01-23 NOTE — Op Note (Signed)
Video Bronchoscopy Procedure Note  Date of Operation: 01/23/2014  Pre-op Diagnosis: Peribronchial nodularity  Post-op Diagnosis: Same  Surgeon: Baltazar Apo  Assistants: none  Anesthesia: conscious sedation, moderate sedation  Meds Given: fentanyl 117mcg, versed 5mg  in divided doses,  1% lidocaine 15cc total  Operation: Flexible video fiberoptic bronchoscopy and biopsies.  Estimated Blood Loss: 0cc  Complications: none noted  Indications and History: [name] is 58 y.o. with history of cough and shortness of breath. A CT scan of the chest from 01/02/14 showed some peribronchovascular nodularity.  Recommendation was to perform video fiberoptic bronchoscopy with BAL for culture data. The risks, benefits, complications, treatment options and expected outcomes were discussed with the patient.  The possibilities of pneumothorax, pneumonia, reaction to medication, pulmonary aspiration, perforation of a viscus, bleeding, failure to diagnose a condition and creating a complication requiring transfusion or operation were discussed with the patient who freely signed the consent.    Description of Procedure: The patient was seen in the Preoperative Area, was examined and was deemed appropriate to proceed.  The patient was taken to [facility], identified as [name] and the procedure verified as Flexible Video Fiberoptic Bronchoscopy.  A Time Out was held and the above information confirmed.   Conscious sedation was initiated as indicated above. The video fiberoptic bronchoscope was introduced via the L nare and a general inspection was performed which showed normal cords, normal trachea, normal main carina. The R sided airways were inspected and showed normal RUL, BI, RML and RLL. The L side was then inspected. The LLL, Lingular and LUL airways were normal. Both LL airways were slightly collapsible with normal expiration. No endobronchial lesions or abnormal secretions were seen.    BAL was performed in the RML to be sent for cytology and micro studies. 60 cc NS instilled and 20cc returned. The patient tolerated the procedure well. The bronchoscope was removed. There were no obvious complications.   Samples: BAL from the RML was sent for cytology, bacterial, AFB and fungal cx  Plans:  We will review the cytology, and microbiology results with the patient when they become available.  Outpatient followup will be with Dr Lamonte Sakai.    Baltazar Apo, MD, PhD 01/23/2014, 10:31 AM [facility] [phone] or if no answer [phone]

## 2014-01-23 NOTE — Progress Notes (Signed)
Video bronchoscopy  Intervention bronchial washing  Good patient tolerance  Baltazar Apo, MD, PhD 01/23/2014, 1:55 PM [facility] [phone] or if no answer [phone]

## 2014-01-23 NOTE — Discharge Instructions (Signed)
Flexible Bronchoscopy, Care After These instructions give you information on caring for yourself after your procedure. Your doctor may also give you more specific instructions. Call your doctor if you have any problems or questions after your procedure. HOME CARE  Do not eat or drink anything for 2 hours after your procedure. If you try to eat or drink before the medicine wears off, food or drink could go into your lungs. You could also burn yourself.  After 2 hours have passed and when you can cough and gag normally, you may eat soft food and drink liquids slowly.  The day after the test, you may eat your normal diet.  You may do your normal activities.  Keep all doctor visits. GET HELP RIGHT AWAY IF:  You get more and more short of breath.  You get lightheaded.  You feel like you are going to pass out (faint).  You have chest pain.  You have new problems that worry you.  You cough up more than a little blood.  You cough up more blood than before. MAKE SURE YOU:  Understand these instructions.  Will watch your condition.  Will get help right away if you are not doing well or get worse. Document Released: 06/07/2009 Document Revised: 05/31/2013 Document Reviewed: 04/14/2013 [facility] Patient Information 2014 [place].   Do not eat or drink anything until 1230 today Tuesday January 23, 2014.  Please call our office for any questions or concerns. [phone]

## 2014-01-23 NOTE — Interval H&P Note (Signed)
PCCM Interval Note.   S: no new issues since last visit.  O:  Filed Vitals:   01/23/14 0925 01/23/14 0930 01/23/14 0935 01/23/14 0940  BP: 159/87 140/87 137/76 139/73  Pulse: 73 74 73 73  Resp: 18 15 16 17   SpO2: 99% 96% 97% 100%   Plans:  Will proceed with FOB. All pt's questions answered.   Baltazar Apo, MD, PhD 01/23/2014, 10:15 AM [facility] [phone] or if no answer [phone]

## 2014-01-25 ENCOUNTER — Encounter (HOSPITAL_COMMUNITY): Payer: Self-pay | Admitting: Emergency Medicine

## 2014-01-25 LAB — CULTURE, BAL-QUANTITATIVE W GRAM STAIN: Colony Count: 50000

## 2014-02-08 ENCOUNTER — Telehealth: Payer: Self-pay | Admitting: Emergency Medicine

## 2014-02-08 NOTE — Telephone Encounter (Signed)
Pt c/o having increased cough. She states after she had the bronch the cough improved some but has returned. Appt set for tomorrow at 11:45. [place] Bing, CMA

## 2014-02-09 ENCOUNTER — Ambulatory Visit (INDEPENDENT_AMBULATORY_CARE_PROVIDER_SITE_OTHER): Payer: 59 | Admitting: Internal Medicine

## 2014-02-09 ENCOUNTER — Encounter: Payer: Self-pay | Admitting: Emergency Medicine

## 2014-02-09 ENCOUNTER — Ambulatory Visit (INDEPENDENT_AMBULATORY_CARE_PROVIDER_SITE_OTHER)
Admission: RE | Admit: 2014-02-09 | Discharge: 2014-02-09 | Disposition: A | Discharge status: Home / Self Care | Payer: 59 | Source: Ambulatory Visit | Attending: Internal Medicine | Admitting: Internal Medicine

## 2014-02-09 ENCOUNTER — Encounter: Payer: Self-pay | Admitting: Internal Medicine

## 2014-02-09 ENCOUNTER — Encounter (INDEPENDENT_AMBULATORY_CARE_PROVIDER_SITE_OTHER): Payer: Self-pay

## 2014-02-09 ENCOUNTER — Telehealth: Payer: Self-pay | Admitting: Internal Medicine

## 2014-02-09 VITALS — BP 122/78 | HR 84 | Temp 97.0°F | Ht 64.5 in | Wt 176.2 lb

## 2014-02-09 DIAGNOSIS — R059 Cough, unspecified: Secondary | ICD-10-CM

## 2014-02-09 DIAGNOSIS — R058 Other specified cough: Secondary | ICD-10-CM

## 2014-02-09 DIAGNOSIS — R05 Cough: Secondary | ICD-10-CM

## 2014-02-09 MED ORDER — TRAMADOL HCL 50 MG PO TABS: ORAL_TABLET | ORAL | Status: DC

## 2014-02-09 MED ORDER — FAMOTIDINE 20 MG PO TABS: ORAL_TABLET | ORAL | Status: DC

## 2014-02-09 MED ORDER — PREDNISONE 10 MG PO TABS: ORAL_TABLET | ORAL | Status: DC

## 2014-02-09 NOTE — Telephone Encounter (Signed)
Pt seen just today 6.19.15 by MW: Patient Instructions     The key to effective treatment for your cough is eliminating the non-stop cycle of cough you're stuck in long enough to let your airway heal completely and then see if there is anything still making you cough once you stop the cough suppression, but this should take no more than 5 days to figure out  First take delsym two tsp every 12 hours and supplement if needed with tramadol 50 mg up to 2 every 4 hours to suppress the urge to cough at all or even clear your throat. Swallowing water or using ice chips/non mint and menthol containing candies (such as lifesavers or sugarless jolly ranchers) are also effective. You should rest your voice and avoid activities that you know make you cough.  Once you have eliminated the cough for 3 straight days try reducing the tramadol first, then the delsym as tolerated.  Prednisone 10 mg take 4 each am x 2 days, 2 each am x 2 days, 1 each am x 2 days and stop (this is to eliminate allergies and inflammation from coughing)  Protonix (pantoprazole) Take 30-60 min before first meal of the day and Pepcid 20 mg one bedtime plus chlorpheniramine 4 mg x 2 at bedtime (both available over the counter) until cough is completely gone for at least a week without the need for cough suppression  GERD (REFLUX) is an extremely common cause of respiratory symptoms, many times with no significant heartburn at all.  It can be treated with medication, but also with lifestyle changes including avoidance of late meals, excessive alcohol, smoking cessation, and avoid fatty foods, chocolate, peppermint, colas, red wine, and acidic juices such as orange juice.  NO MINT OR MENTHOL PRODUCTS SO NO COUGH DROPS  USE HARD CANDY INSTEAD (jolley ranchers or Stover's or Lifesavers (all available in sugarless versions)  NO OIL BASED VITAMINS - use powdered substitutes.  Please remember to go to the x-ray department downstairs for your tests - we  will call you with the results when they are available.   [facility], spoke with pharmacist Kit Rulac -- no scripts received for this patient.  Upon inspection of pt's chart, all 3 script (pepcid, tramadol, prednisone) were printed.  Verbal order given to Kit for all 3 medications as prescribed by MW.  Called spoke with patient, apologized for any inconvenience/miscommunication.  Advised pt her medications have been called into her pharmacy.  Pt expressed some difficulty finding the chlorpheniramine that was recommended by MW.  Advised pt that another name could be Chlor-tab and to look for this in the allergy section of her pharmacy, but to try another store as well if [facility] did not have it ([facility], [facility], [facility], etc).  Asked to please let us know if she continues to have difficulty finding the chlorpheniramine and we can ask MW for another alternative for her.  Pt okay with this and verbalized her understanding.  Nothing further needed at this time; will sign off.

## 2014-02-09 NOTE — Progress Notes (Signed)
Subjective:   Patient ID: [name], female    DOB: 10-23-1955    MRN: [id number]    Brief patient profile:  58 yo former former smoker, hx of HTN, GERD, allergies. She is referred today for an abnormal CT scan of the chest, most recently on 01/02/14. Shows mild peribronchial thickening w some scattered peripheral interstitial changes, microvascular nodular changes, mild bronchiectasis, RML basilar scarring, stable RML nodule. Very little change compared with prior scan on 05/02/12 but more so than 2009. She has allergies with sensitivity to grasses, pollens.    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 Distant relative had SLE.  She was dx with PNA and bronchitis frequently as a youngster.   Spirometry 2012 at [facility] > AFL especially small airways. rec  Fob done 01/23/14     02/09/2014 acute  ov/Wert re: cough since fob 01/23/14  Chief Complaint  Patient presents with  . Acute Visit    Pt states she has had a progressivly worsening dry cough since bronch on 01/23/2014. Pt c/o dyspnea with exertion. Denies CP.   works at [employer] and gamble stopped x 2 years any fume/duse exp Cough is worse as day goes on , never brings anything up. Some doe but not at rest, sob mostly with coughing, some with heavy exertion  No obvious day to day or daytime variabilty or assoc  cp or chest tightness, subjective wheeze overt sinus or hb symptoms. No unusual exp hx or h/o childhood pna/ asthma or knowledge of premature birth.  Sleeping ok without nocturnal  or early am exacerbation  of respiratory  c/o's or need for noct saba. Also denies any obvious fluctuation of symptoms with weather or environmental changes or other aggravating or alleviating factors except as outlined above   Current Medications, Allergies, Complete Past  Medical History, Past Surgical History, Family History, and Social History were reviewed in [employer] record.  ROS  The following are not active complaints unless bolded sore throat, dysphagia, dental problems, itching, sneezing,  nasal congestion or excess/ purulent secretions, ear ache,   fever, chills, sweats, unintended wt loss, pleuritic or exertional cp, hemoptysis,  orthopnea pnd or leg swelling, presyncope, palpitations, heartburn, abdominal pain, anorexia, nausea, vomiting, diarrhea  or change in bowel or urinary habits, change in stools or urine, dysuria,hematuria,  rash, arthralgias, visual complaints, headache, numbness weakness or ataxia or problems with walking or coordination,  change in mood/affect or memory.              Past Medical History  Diagnosis Date  . High blood pressure   . Allergy   . Heartburn      Family History  Problem Relation Age of Onset  . Heart disease Maternal Grandfather   . Heart disease Paternal Grandmother   . Cancer Mother     lyphoma  . Allergies Mother   . Allergies Brother      History   Social History  . Marital Status: Married    Spouse Name: N/A    Number of Children: N/A  . Years of Education: N/A   Occupational History  . Not on file.   Social History Main Topics  . Smoking status: Former Smoker -- 1.50 packs/day for 30 years    Types: Cigarettes  Quit date: 05/24/2009  . Smokeless tobacco: Not on file  . Alcohol Use: No  . Drug Use: No  . Sexual Activity: Not on file   Other Topics Concern  . Not on file   Social History Narrative  . No narrative on file     Allergies  Allergen Reactions  . Latex   . Prednisone     Vomit & extreme headache     No outpatient prescriptions prior to visit.   No facility-administered medications prior to visit.        Objective:   Physical Exam   Wt Readings from Last 3 Encounters:  02/09/14 176 lb 3.2 oz (79.924 kg)  01/08/14 177 lb  (80.287 kg)  01/01/11 153 lb (69.4 kg)      Gen: Pleasant, well-nourished, in no distress,  normal affect  ENT: No lesions,  mouth clear,  oropharynx clear, no postnasal drip  Neck: No JVD, no TMG, no carotid bruits  Lungs: No use of accessory muscles, clear without rales or rhonchi  Cardiovascular: RRR, heart sounds normal, no murmur or gallops, no peripheral edema  Musculoskeletal: No deformities, no cyanosis or clubbing  Neuro: alert, non focal  Skin: Warm, no lesions or rashes    cxr 12/04/13 Progressive anterior basilar infiltrates since previous exam.  Atypical infection not excluded  CXR  02/09/2014 :  No acute cardiopulmonary disease.      Assessment & Plan:

## 2014-02-09 NOTE — Patient Instructions (Signed)
The key to effective treatment for your cough is eliminating the non-stop cycle of cough you're stuck in long enough to let your airway heal completely and then see if there is anything still making you cough once you stop the cough suppression, but this should take no more than 5 days to figure out  First take delsym two tsp every 12 hours and supplement if needed with  tramadol 50 mg up to 2 every 4 hours to suppress the urge to cough at all or even clear your throat. Swallowing water or using ice chips/non mint and menthol containing candies (such as lifesavers or sugarless jolly ranchers) are also effective.  You should rest your voice and avoid activities that you know make you cough.  Once you have eliminated the cough for 3 straight days try reducing the tramadol first,  then the delsym as tolerated.    Prednisone 10 mg take  4 each am x 2 days,   2 each am x 2 days,  1 each am x 2 days and stop (this is to eliminate allergies and inflammation from coughing)  Protonix (pantoprazole) Take 30-60 min before first meal of the day and Pepcid 20 mg one bedtime plus chlorpheniramine 4 mg x 2 at bedtime (both available over the counter)  until cough is completely gone for at least a week without the need for cough suppression  GERD (REFLUX)  is an extremely common cause of respiratory symptoms, many times with no significant heartburn at all.    It can be treated with medication, but also with lifestyle changes including avoidance of late meals, excessive alcohol, smoking cessation, and avoid fatty foods, chocolate, peppermint, colas, red wine, and acidic juices such as orange juice.  NO MINT OR MENTHOL PRODUCTS SO NO COUGH DROPS  USE HARD CANDY INSTEAD (jolley ranchers or Stover's or Lifesavers (all available in sugarless versions) NO OIL BASED VITAMINS - use powdered substitutes.   Please remember to go to the x-ray department downstairs for your tests - we will call you with the results when they  are available.         

## 2014-02-10 NOTE — Assessment & Plan Note (Signed)
FOB induced  Upper airway cough syndrome, so named because it's frequently impossible to sort out how much is  CR/sinusitis with freq throat clearing (which can be related to primary GERD)   vs  causing  secondary (" extra esophageal")  GERD from wide swings in gastric pressure that occur with throat clearing, often  promoting self use of mint and menthol lozenges that reduce the lower esophageal sphincter tone and exacerbate the problem further in a cyclical fashion.   These are the same pts (now being labeled as having "irritable larynx syndrome" by some cough centers) who not infrequently have a history of having failed to tolerate ace inhibitors,  dry powder inhalers or biphosphonates or report having atypical reflux symptoms that don't respond to standard doses of PPI , and are easily confused as having aecopd or asthma flares by even experienced allergists/ pulmonologists.   rec max gerd/cough suppression > f/u Dr Lamonte Sakai as planned  See instructions for specific recommendations which were reviewed directly with the patient who was given a copy with highlighter outlining the key components.

## 2014-02-12 NOTE — [place] Notes (Signed)
Quick Note:  Spoke with pt and notified of results per Dr. Wert. Pt verbalized understanding and denied any questions.  ______ 

## 2014-02-20 LAB — FUNGUS CULTURE W SMEAR: FUNGAL SMEAR: NONE SEEN

## 2014-02-27 ENCOUNTER — Other Ambulatory Visit: Payer: Self-pay | Admitting: Emergency Medicine

## 2014-02-27 DIAGNOSIS — R059 Cough, unspecified: Secondary | ICD-10-CM

## 2014-02-27 DIAGNOSIS — R05 Cough: Secondary | ICD-10-CM

## 2014-02-28 ENCOUNTER — Ambulatory Visit (INDEPENDENT_AMBULATORY_CARE_PROVIDER_SITE_OTHER): Payer: 59 | Admitting: Emergency Medicine

## 2014-02-28 ENCOUNTER — Encounter: Payer: Self-pay | Admitting: Emergency Medicine

## 2014-02-28 ENCOUNTER — Other Ambulatory Visit: Payer: 59

## 2014-02-28 ENCOUNTER — Telehealth: Payer: Self-pay | Admitting: Internal Medicine

## 2014-02-28 VITALS — BP 140/94 | HR 88 | Ht 63.5 in | Wt 172.0 lb

## 2014-02-28 DIAGNOSIS — R05 Cough: Secondary | ICD-10-CM

## 2014-02-28 DIAGNOSIS — R059 Cough, unspecified: Secondary | ICD-10-CM

## 2014-02-28 DIAGNOSIS — R9389 Abnormal findings on diagnostic imaging of other specified body structures: Secondary | ICD-10-CM

## 2014-02-28 MED ORDER — ALBUTEROL SULFATE HFA 108 (90 BASE) MCG/ACT IN AERS: 2.0000 | INHALATION_SPRAY | Freq: Four times a day (QID) | RESPIRATORY_TRACT | Status: DC | PRN

## 2014-02-28 NOTE — Patient Instructions (Signed)
We will perform blood work today We will follow your culture results from your bronchoscopy Follow with Dr Lamonte Sakai in 1 month to review your testing

## 2014-02-28 NOTE — [place] Notes (Signed)
   Subjective:   Patient ID: [name], female    DOB: Sep 15, 1955    MRN: [id number]  Brief patient profile:  58 yo former former smoker, hx of HTN, GERD, allergies. She is referred today for an abnormal CT scan of the chest, most recently on 01/02/14. Shows mild peribronchial thickening w some scattered peripheral interstitial changes, microvascular nodular changes, mild bronchiectasis, RML basilar scarring, stable RML nodule. Very little change compared with prior scan on 05/02/12 but more so than 2009. She has allergies with sensitivity to grasses, pollens.    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 Distant relative had SLE.  She was dx with PNA and bronchitis frequently as a youngster.   Spirometry 2012 at [facility] > AFL especially small airways. rec  Fob done 01/23/14    acute  02/09/14 - ov/Wert re: cough since fob 01/23/14  works at [employer] and gamble stopped x 2 years any fume/duse exp Cough is worse as day goes on , never brings anything up. Some doe but not at rest, sob mostly with coughing, some with heavy exertion  No obvious day to day or daytime variabilty or assoc  cp or chest tightness, subjective wheeze overt sinus or hb symptoms. No unusual exp hx or h/o childhood pna/ asthma or knowledge of premature birth.  Sleeping ok without nocturnal  or early am exacerbation  of respiratory  c/o's or need for noct saba. Also denies any obvious fluctuation of symptoms with weather or environmental changes or other aggravating or alleviating factors except as outlined above    ROV 02/28/14 -- follows up for cough, micronodular disease with some bronchiectasis. Underwent BAL on 01/23/14, all still pending but negative to date. PFT with mild AFL, no BD response.  She was tried on pepcid, given pred, chlorpheniramine for  cough after our FOB > cough is better but still happens.           Objective:   Physical Exam Wt Readings from Last 3 Encounters:  02/28/14 172 lb (78.019 kg)  02/09/14 176 lb 3.2 oz (79.924 kg)  01/08/14 177 lb (80.287 kg)   Filed Vitals:   02/28/14 1057  BP: 140/94  Pulse: 88  Height: 5' 3.5" (1.613 m)  Weight: 172 lb (78.019 kg)  SpO2: 94%     Gen: Pleasant, well-nourished, in no distress,  normal affect  ENT: No lesions,  mouth clear,  oropharynx clear, no postnasal drip  Neck: No JVD, no TMG, no carotid bruits  Lungs: No use of accessory muscles, clear without rales or rhonchi  Cardiovascular: RRR, heart sounds normal, no murmur or gallops, no peripheral edema  Musculoskeletal: No deformities, no cyanosis or clubbing  Neuro: alert, non focal  Skin: Warm, no lesions or rashes       Assessment & Plan:   Abnormal CT scan, chest Etiology unclear but cx data still pending from BAL.  - follow the cx results out - check auto-immune labs - rov 1

## 2014-02-28 NOTE — Assessment & Plan Note (Signed)
Etiology unclear but cx data still pending from BAL.  - follow the cx results out - check auto-immune labs - rov 1

## 2014-02-28 NOTE — [place] Notes (Signed)
PFT done today. 

## 2014-03-01 LAB — SJOGRENS SYNDROME-A EXTRACTABLE NUCLEAR ANTIBODY: SSA (Ro) (ENA) Antibody, IgG: <1

## 2014-03-01 LAB — ANCA SCREEN W REFLEX TITER
Atypical p-ANCA Screen: NEGATIVE
C-ANCA SCREEN: NEGATIVE
p-ANCA Screen: NEGATIVE

## 2014-03-01 LAB — ANA: ANA: NEGATIVE

## 2014-03-01 LAB — ANTI-SCLERODERMA ANTIBODY: Scleroderma (Scl-70) (ENA) Antibody, IgG: <1

## 2014-03-01 LAB — RHEUMATOID FACTOR

## 2014-03-01 LAB — ANTI-DNA ANTIBODY, DOUBLE-STRANDED: ds DNA Ab: <1 IU/mL

## 2014-03-01 LAB — SJOGRENS SYNDROME-B EXTRACTABLE NUCLEAR ANTIBODY: SSB (La) (ENA) Antibody, IgG: <1

## 2014-03-01 NOTE — Telephone Encounter (Signed)
lmomtcb for pt 

## 2014-03-02 NOTE — Telephone Encounter (Signed)
lmomtcb x 2  

## 2014-03-02 NOTE — Telephone Encounter (Signed)
lmomtcb x1 

## 2014-03-02 NOTE — Telephone Encounter (Signed)
Pt returned call & can be reached at [phone].  [name]

## 2014-03-05 NOTE — Telephone Encounter (Signed)
LMTCBx2. Kelcey Korus, CMA  

## 2014-03-05 NOTE — Telephone Encounter (Addendum)
Spoke with pt--aware that dr Lamonte Sakai will be back in office tomorrow Will have Dr Lamonte Sakai complete FMLA form.  Will give to Meghan to have her speak with RB 03/06/14 once I talk with Hoyle Sauer ( [employer]) Need to verify that it is okay for Dr Lamonte Sakai to mark through Dr Gustavus Bryant name one bottom of form and place his own. There are no signatures of MW on this form nor is his name placed anywhere else within packet. Will call Hoyle Sauer in AM ext [phone] Will hold in triage until speaking with Hoyle Sauer

## 2014-03-05 NOTE — Telephone Encounter (Signed)
I have these forms in my possession in triage working on them. Will hold in triage in the meantime.

## 2014-03-05 NOTE — Telephone Encounter (Signed)
Pt doesn't need anymore help with this stats that it was filled out wrong anyway then hung up on me

## 2014-03-05 NOTE — Telephone Encounter (Signed)
Apologize to the patient and I agree with her but the problem is that the paperwork came to me instead of Dr Dia Sitter  The original date to be accurate should therefore be 01/08/14 if she wants me to complete it for Dr Lamonte Sakai and I'm fine with the year ending 02/15/15

## 2014-03-05 NOTE — Telephone Encounter (Signed)
Spoke with patient regarding FMLA paperwork Pt is very frustrated with the fact that this process has taken entirely too long in getting the paperwork filled out--states that the forms were not filled out correctly. Pt states that she spoke with her employer [employer] and [employer]) and they have advised her to make changes to her FMLA paperwork (1) Needs to be back dated to the beginning of treatment/care which was 01/23/14 when she had Bronchoscopy (2) Requesting that FMLA be extended x 1 year instead of 02/14/14  Pt states that the reason for extension is to cover the patient for a whole year so that she does not have to repeatedly fill out the forms for every possible event-that may result in her missing work.   Forms have been printed -- dates to be changed have been highlighted Form given to [facility].  Please advise Dr Melvyn Novas if these changes can be made. Thanks.   Patient requests a call back today-- per pt, try ALL numbers on chart, leave detailed msg if no answer.

## 2014-03-06 NOTE — Telephone Encounter (Signed)
Spoke with Hoyle Sauer -- [employer] Sending up a new copy to have Dr Lamonte Sakai fill out.  Patient is requesting these be completed and faxed back ASAP as this process has already taken up a lot of time.  Please advise Meghan. Thanks.

## 2014-03-06 NOTE — Telephone Encounter (Signed)
RB has signed these forms and they have been sent down to medical records. Nothing further needed at this time

## 2014-03-07 LAB — AFB CULTURE WITH SMEAR (NOT AT ARMC): Acid Fast Smear: NONE SEEN

## 2014-03-14 LAB — PULMONARY FUNCTION TEST
DL/VA % PRED: 85 %
DL/VA: 4.06 ml/min/mmHg/L
DLCO unc % pred: 75 %
DLCO unc: 17.74 ml/min/mmHg
FEF 25-75 Post: 1.93 L/sec
FEF 25-75 Pre: 1.57 L/sec
FEF2575-%Change-Post: 23 %
FEF2575-%PRED-PRE: 66 %
FEF2575-%Pred-Post: 81 %
FEV1-%CHANGE-POST: 4 %
FEV1-%PRED-PRE: 79 %
FEV1-%Pred-Post: 83 %
FEV1-POST: 2.11 L
FEV1-Pre: 2.02 L
FEV1FVC-%CHANGE-POST: 0 %
FEV1FVC-%Pred-Pre: 95 %
FEV6-%CHANGE-POST: 3 %
FEV6-%Pred-Post: 88 %
FEV6-%Pred-Pre: 85 %
FEV6-Post: 2.8 L
FEV6-Pre: 2.71 L
FEV6FVC-%Change-Post: 0 %
FEV6FVC-%Pred-Post: 102 %
FEV6FVC-%Pred-Pre: 103 %
FVC-%Change-Post: 4 %
FVC-%Pred-Post: 86 %
FVC-%Pred-Pre: 82 %
FVC-Post: 2.82 L
FVC-Pre: 2.71 L
POST FEV6/FVC RATIO: 99 %
Post FEV1/FVC ratio: 75 %
Pre FEV1/FVC ratio: 74 %
Pre FEV6/FVC Ratio: 100 %
RV % pred: 102 %
RV: 1.99 L
TLC % PRED: 94 %
TLC: 4.68 L

## 2014-04-13 ENCOUNTER — Encounter: Payer: Self-pay | Admitting: Emergency Medicine

## 2014-04-13 ENCOUNTER — Ambulatory Visit (INDEPENDENT_AMBULATORY_CARE_PROVIDER_SITE_OTHER): Payer: 59 | Admitting: Emergency Medicine

## 2014-04-13 VITALS — BP 142/84 | HR 77 | Ht 63.5 in | Wt 173.0 lb

## 2014-04-13 DIAGNOSIS — R9389 Abnormal findings on diagnostic imaging of other specified body structures: Secondary | ICD-10-CM

## 2014-04-13 DIAGNOSIS — K2100 Gastro-esophageal reflux disease with esophagitis, without bleeding: Secondary | ICD-10-CM

## 2014-04-13 NOTE — Assessment & Plan Note (Signed)
GERD is poorly controlled back on nexium qd Will refer to GI > she would like to go to Dr Sydell Axon in [place].

## 2014-04-13 NOTE — Assessment & Plan Note (Signed)
Auto-immune panel negative, BAL cx's are all negative. Cough is better. At this point I believe we can follow her, re visit timing of repeat CT scan chest in Spring '16. Will see her sooner if she has any problems.

## 2014-04-13 NOTE — Patient Instructions (Signed)
We will refer you to gastroenterology Continue your current medications as you are taking them Follow with Dr Lamonte Sakai in Spring 2016 to discuss timing of a repeat CXR or CT scan of your chest .

## 2014-04-13 NOTE — Progress Notes (Signed)
Subjective:   Patient ID: [name], female    DOB: 1956/02/22    MRN: [id number]  Brief patient profile:  58 yo former former smoker, hx of HTN, GERD, allergies. She is referred today for an abnormal CT scan of the chest, most recently on 01/02/14. Shows mild peribronchial thickening w some scattered peripheral interstitial changes, microvascular nodular changes, mild bronchiectasis, RML basilar scarring, stable RML nodule. Very little change compared with prior scan on 05/02/12 but more so than 2009. She has allergies with sensitivity to grasses, pollens.    Exposures > has lived in [place] and [place], she has owned dogs, cats, never birds, no mold exposures Has worked in a Education officer, community, 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 No history of auto-immune disease. Distant relative had SLE.  She was dx with PNA and bronchitis frequently as a youngster.   Spirometry 2012 at [facility] > AFL especially small airways. rec  Fob done 01/23/14   acute  02/09/14 - ov/Wert re: cough since fob 01/23/14  works at [employer] and gamble stopped x 2 years any fume/duse exp Cough is worse as day goes on , never brings anything up. Some doe but not at rest, sob mostly with coughing, some with heavy exertion  No obvious day to day or daytime variabilty or assoc  cp or chest tightness, subjective wheeze overt sinus or hb symptoms. No unusual exp hx or h/o childhood pna/ asthma or knowledge of premature birth.  Sleeping ok without nocturnal  or early am exacerbation  of respiratory  c/o's or need for noct saba. Also denies any obvious fluctuation of symptoms with weather or environmental changes or other aggravating or alleviating factors except as outlined above   ROV 02/28/14 -- follows up for cough, micronodular disease with some bronchiectasis. Underwent BAL on 01/23/14, all still pending but negative to date. PFT with mild AFL, no BD response.  She was tried on pepcid, given pred, chlorpheniramine for  cough after our FOB > cough is better but still happens.    ROB 04/13/14 -- follows for mild AFL, micronodular disease, chronic cough. FOB was performed 01/23/14 > all cx data negative.  All auto-immune labs negative (ANCA, ANA, RF, ascl70, SSA/B). She is no longer having nausea / diarrhea > ascribed to dexilant. Now back on nexium.  She is having dry eyes and dry mouth. Her cough and allergies are better. Her GERD is moderately controlled.          Objective:   Physical Exam Wt Readings from Last 3 Encounters:  04/13/14 173 lb (78.472 kg)  02/28/14 172 lb (78.019 kg)  02/09/14 176 lb 3.2 oz (79.924 kg)   Filed Vitals:   04/13/14 1356  BP: 142/84  Pulse: 77  Height: 5' 3.5" (1.613 m)  Weight: 173 lb (78.472 kg)  SpO2: 94%     Gen: Pleasant, well-nourished, in no distress,  normal affect  ENT: No lesions,  mouth clear,  oropharynx clear, no postnasal drip  Neck: No JVD, no TMG, no carotid bruits  Lungs: No use of accessory muscles, clear without rales or rhonchi  Cardiovascular: RRR, heart sounds normal, no murmur or gallops, no peripheral edema  Musculoskeletal: No deformities, no cyanosis or clubbing  Neuro: alert, non focal  Skin: Warm, no lesions or rashes       Assessment & Plan:   GERD GERD is poorly controlled back on nexium qd Will refer to GI > she would  like to go to Dr Sydell Axon in [place].   Abnormal CT scan, chest Auto-immune panel negative, BAL cx's are all negative. Cough is better. At this point I believe we can follow her, re visit timing of repeat CT scan chest in Spring '16. Will see her sooner if she has any problems.

## 2014-04-16 ENCOUNTER — Encounter: Payer: Self-pay | Admitting: *Deleted

## 2014-05-18 ENCOUNTER — Ambulatory Visit: Payer: 59 | Admitting: Gastroenterology

## 2014-05-23 ENCOUNTER — Ambulatory Visit (INDEPENDENT_AMBULATORY_CARE_PROVIDER_SITE_OTHER): Payer: 59 | Admitting: Gastroenterology

## 2014-05-23 ENCOUNTER — Encounter: Payer: Self-pay | Admitting: Gastroenterology

## 2014-05-23 ENCOUNTER — Other Ambulatory Visit: Payer: Self-pay

## 2014-05-23 VITALS — BP 136/75 | HR 72 | Temp 97.1°F | Ht 64.5 in | Wt 174.2 lb

## 2014-05-23 DIAGNOSIS — K2100 Gastro-esophageal reflux disease with esophagitis, without bleeding: Secondary | ICD-10-CM

## 2014-05-23 DIAGNOSIS — K219 Gastro-esophageal reflux disease without esophagitis: Secondary | ICD-10-CM

## 2014-05-23 MED ORDER — NEXIUM 40 MG PO CPDR: 40.0000 mg | DELAYED_RELEASE_CAPSULE | Freq: Two times a day (BID) | ORAL | Status: DC

## 2014-05-23 NOTE — Assessment & Plan Note (Signed)
Refractory GERD. Increase Nexium to 40mg  BID before a meal. Plan on diagnostic EGD with Dr. Oneida Alar.  I have discussed the risks, alternatives, benefits with regards to but not limited to the risk of reaction to medication, bleeding, infection, perforation and the patient is agreeable to proceed. Written consent to be obtained.  Discussed antireflux measures. Need for 10 pound weight loss over next 3 months.   I have requested copy of last TCS/EGD, Dr. Arnoldo Morale, 2010 for review.

## 2014-05-23 NOTE — Progress Notes (Signed)
Primary Care Physician: Purvis Kilts, MD  Primary Gastroenterologist:  Barney Drain, MD   Chief Complaint  Patient presents with  . Gastrophageal Reflux    HPI: [name] is a 58 y.o. female here for further management of refractory GERD. Patient reports her pulmonologist and dentist are concerned her GERD is cause of dental caries, cough, peribroncovascular nodularity and interstitial changes. Extensive evaluation at [facility].   Chronic GERD. Was doing well on branded Nexium until insurance made her change to generic nexium in the spring. Started having breakthrough substernal pressure, regurgitation. Switched to [employer] which was working very well but she developed diarrhea and had to come off the medication. Reports she is currently taking branded Nexium once daily before breakfast. She works rotating 12 hr shifts and can't sleep. Within 3 hours of falling asleep she wakes up with regurgitation/chest pressure. Tried Gas-X/Tums/Rolaids prn without relief. Now having frequent daytime symptoms too. Reports remote cardiac w/u in 2008, uneventful. Diagnosed with GERD at that time. C/o dry mouth. No NSAIDs/ASA. No dysphagia, abdominal pain, melena, brbpr. BM regular. Previously failed Prilosec, Prevacid, generic nexium.     Wt Readings from Last 3 Encounters:  05/23/14 174 lb 3.2 oz (79.017 kg)  04/13/14 173 lb (78.472 kg)  02/28/14 172 lb (78.019 kg)     Current Outpatient Prescriptions  Medication Sig Dispense Refill  . albuterol (PROVENTIL HFA;VENTOLIN HFA) 108 (90 BASE) MCG/ACT inhaler Inhale 2 puffs into the lungs every 6 (six) hours as needed for wheezing or shortness of breath.  1 Inhaler  6  . amoxicillin (AMOXIL) 500 MG tablet Take 500 mg by mouth 2 (two) times daily. Finishes up in a day/ had for tooth extraction      . carboxymethylcellulose (REFRESH PLUS) 0.5 % SOLN 1 drop 3 (three) times daily as needed.      Marland Kitchen esomeprazole (NEXIUM) 40 MG  capsule Take 40 mg by mouth daily before breakfast.       . fexofenadine (ALLEGRA) 180 MG tablet Take 180 mg by mouth daily.      Marland Kitchen losartan (COZAAR) 50 MG tablet 1 tablet daily.      . Misc Natural Products (OSTEO BI-FLEX JOINT SHIELD PO) Take 1 tablet by mouth daily.      . Probiotic Product (ALIGN PO) Take 1 tablet by mouth daily.      . Simethicone (GAS-X EXTRA STRENGTH) 125 MG CAPS Take 2 capsules by mouth 4 (four) times daily.      . Trospium Chloride 60 MG CP24 Take 1 capsule by mouth daily.      . Xylitol (XYLIMELTS) 500 MG DISK Use as directed 1 tablet in the mouth or throat at bedtime.       No current facility-administered medications for this visit.    Allergies as of 05/23/2014 - Review Complete 05/23/2014  Allergen Reaction Noted  . Dexilant [dexlansoprazole]  04/13/2014  . Latex  04/30/2010  . Percocet [oxycodone-acetaminophen]     Past Medical History  Diagnosis Date  . High blood pressure   . Allergy   . Heartburn    Past Surgical History  Procedure Laterality Date  . Gallbladder surgery    . Abdominal hysterectomy    . Video bronchoscopy Bilateral 01/23/2014    Procedure: VIDEO BRONCHOSCOPY WITHOUT FLUORO;  Surgeon: Collene Gobble, MD;  Location: [facility];  Service: Cardiopulmonary;  Laterality: Bilateral;  . Eus  05/21/10    outlaw:hyperechoic liver mild consistent with steatosis otherwise  normal [place]   Family History  Problem Relation Age of Onset  . Heart disease Maternal Grandfather   . Heart disease Paternal Grandmother   . Cancer Mother     lymphoma  . Allergies Mother   . Allergies Brother   . Colon cancer Neg Hx    History   Social History  . Marital Status: Married    Spouse Name: N/A    Number of Children: N/A  . Years of Education: N/A   Social History Main Topics  . Smoking status: Former Smoker -- 1.50 packs/day for 30 years    Types: Cigarettes    Quit date: 05/24/2009  . Smokeless tobacco: None     Comment: quit x 6 years  .  Alcohol Use: No  . Drug Use: No  . Sexual Activity: None   Other Topics Concern  . None   Social History Narrative  . None    ROS:  General: Negative for anorexia, weight loss, fever, chills, fatigue, weakness. ENT: Negative for hoarseness, difficulty swallowing , nasal congestion. CV: Negative for chest pain, angina, palpitations, dyspnea on exertion, peripheral edema.  Respiratory: Negative for dyspnea at rest, dyspnea on exertion, cough, sputum, wheezing.  GI: See history of present illness. GU:  Negative for dysuria, hematuria, urinary incontinence, urinary frequency, nocturnal urination.  Endo: Negative for unusual weight change.    Physical Examination:   BP 136/75  Pulse 72  Temp(Src) 97.1 F (36.2 C) (Oral)  Ht 5' 4.5" (1.638 m)  Wt 174 lb 3.2 oz (79.017 kg)  BMI 29.45 kg/m2  General: Well-nourished, well-developed in no acute distress.  Eyes: No icterus. Mouth: Oropharyngeal mucosa moist and pink , no lesions erythema or exudate. Lungs: Clear to auscultation bilaterally.  Heart: Regular rate and rhythm, no murmurs rubs or gallops.  Abdomen: Bowel sounds are normal, nontender, nondistended, no hepatosplenomegaly or masses, no abdominal bruits or hernia , no rebound or guarding.   Extremities: No lower extremity edema. No clubbing or deformities. Neuro: Alert and oriented x 4   Skin: Warm and dry, no jaundice.   Psych: Alert and cooperative, normal mood and affect.  Labs:  Anti-scleroderma Ab, Ds DNA Ab, RF, ANA, Sjogrens Abs, ANCA all negative.

## 2014-05-23 NOTE — Progress Notes (Signed)
cc'ed to pcp °

## 2014-05-23 NOTE — Patient Instructions (Signed)
1. Increase Nexium to twice daily before a meal. 2. Upper endoscopy as planned. See separate instructions.

## 2014-05-28 ENCOUNTER — Telehealth: Payer: Self-pay | Admitting: Gastroenterology

## 2014-05-28 NOTE — Telephone Encounter (Signed)
Pt called and said that she had LMOM on ext [phone] and called right back to let someone know that the papers her insurance/pharmacy was sending [place] for her prescription needed to be back dated to OCT 1st. I have not seen anything off the fax as of yet,but they did call to get our fax number earlier.  Patient had been seen recently by LSL on 9/30

## 2014-05-28 NOTE — Telephone Encounter (Signed)
I have not seen any papers.

## 2014-05-30 ENCOUNTER — Encounter (HOSPITAL_COMMUNITY): Payer: Self-pay | Admitting: Pharmacy Technician

## 2014-05-30 NOTE — Telephone Encounter (Signed)
Working on PA

## 2014-06-08 ENCOUNTER — Ambulatory Visit (HOSPITAL_COMMUNITY)
Admission: RE | Admit: 2014-06-08 | Discharge: 2014-06-08 | Disposition: A | Discharge status: Home / Self Care | Payer: 59 | Source: Ambulatory Visit | Attending: Gastroenterology | Admitting: Gastroenterology

## 2014-06-08 ENCOUNTER — Telehealth: Payer: Self-pay

## 2014-06-08 ENCOUNTER — Encounter (HOSPITAL_COMMUNITY): Payer: Self-pay

## 2014-06-08 ENCOUNTER — Encounter (HOSPITAL_COMMUNITY)
Admission: RE | Disposition: A | Discharge status: Home / Self Care | Payer: Self-pay | Source: Ambulatory Visit | Attending: Gastroenterology

## 2014-06-08 DIAGNOSIS — K2950 Unspecified chronic gastritis without bleeding: Secondary | ICD-10-CM | POA: Insufficient documentation

## 2014-06-08 DIAGNOSIS — Z79899 Other long term (current) drug therapy: Secondary | ICD-10-CM | POA: Insufficient documentation

## 2014-06-08 DIAGNOSIS — K2100 Gastro-esophageal reflux disease with esophagitis, without bleeding: Secondary | ICD-10-CM

## 2014-06-08 DIAGNOSIS — R1013 Epigastric pain: Secondary | ICD-10-CM | POA: Diagnosis present

## 2014-06-08 DIAGNOSIS — K449 Diaphragmatic hernia without obstruction or gangrene: Secondary | ICD-10-CM | POA: Insufficient documentation

## 2014-06-08 DIAGNOSIS — Z888 Allergy status to other drugs, medicaments and biological substances status: Secondary | ICD-10-CM | POA: Diagnosis not present

## 2014-06-08 DIAGNOSIS — Z87891 Personal history of nicotine dependence: Secondary | ICD-10-CM | POA: Insufficient documentation

## 2014-06-08 DIAGNOSIS — Z885 Allergy status to narcotic agent status: Secondary | ICD-10-CM | POA: Diagnosis not present

## 2014-06-08 DIAGNOSIS — Z91040 Latex allergy status: Secondary | ICD-10-CM | POA: Diagnosis not present

## 2014-06-08 HISTORY — PX: ESOPHAGOGASTRODUODENOSCOPY: SHX5428

## 2014-06-08 SURGERY — EGD (ESOPHAGOGASTRODUODENOSCOPY)
Anesthesia: Moderate Sedation

## 2014-06-08 MED ORDER — LIDOCAINE VISCOUS 2 % MT SOLN
OROMUCOSAL | Status: DC | PRN
  Administered 2014-06-08: 4 mL via OROMUCOSAL

## 2014-06-08 MED ORDER — SODIUM CHLORIDE 0.9 % IV SOLN
INTRAVENOUS | Status: DC
  Administered 2014-06-08: 11:00:00 via INTRAVENOUS

## 2014-06-08 MED ORDER — MIDAZOLAM HCL 5 MG/5ML IJ SOLN
INTRAMUSCULAR | Status: AC
  Filled 2014-06-08: qty 10

## 2014-06-08 MED ORDER — MIDAZOLAM HCL 5 MG/5ML IJ SOLN
INTRAMUSCULAR | Status: DC | PRN
  Administered 2014-06-08 (×3): 2 mg via INTRAVENOUS

## 2014-06-08 MED ORDER — MINERAL OIL PO OIL
TOPICAL_OIL | ORAL | Status: AC
  Filled 2014-06-08: qty 30

## 2014-06-08 MED ORDER — LIDOCAINE VISCOUS 2 % MT SOLN
OROMUCOSAL | Status: AC
  Filled 2014-06-08: qty 15

## 2014-06-08 MED ORDER — MEPERIDINE HCL 100 MG/ML IJ SOLN
INTRAMUSCULAR | Status: DC | PRN
  Administered 2014-06-08: 50 mg via INTRAVENOUS
  Administered 2014-06-08: 25 mg via INTRAVENOUS

## 2014-06-08 MED ORDER — STERILE WATER FOR IRRIGATION IR SOLN
Status: DC | PRN
  Administered 2014-06-08: 12:00:00

## 2014-06-08 MED ORDER — MEPERIDINE HCL 100 MG/ML IJ SOLN
INTRAMUSCULAR | Status: AC
  Filled 2014-06-08: qty 2

## 2014-06-08 NOTE — H&P (Signed)
Primary Care Physician:  Purvis Kilts, MD Primary Gastroenterologist:  Dr. Oneida Alar  Pre-Procedure History & Physical: HPI:  [name] is a 58 y.o. female here for DYSPEPSIA.  Past Medical History  Diagnosis Date  . High blood pressure   . Allergy   . Heartburn     Past Surgical History  Procedure Laterality Date  . Gallbladder surgery    . Abdominal hysterectomy    . Video bronchoscopy Bilateral 01/23/2014    Procedure: VIDEO BRONCHOSCOPY WITHOUT FLUORO;  Surgeon: Collene Gobble, MD;  Location: [facility];  Service: Cardiopulmonary;  Laterality: Bilateral;  . Eus  05/21/10    outlaw:hyperechoic liver mild consistent with steatosis otherwise normal [place]    Prior to Admission medications   Medication Sig Start Date End Date Taking? Authorizing Provider  albuterol (PROVENTIL HFA;VENTOLIN HFA) 108 (90 BASE) MCG/ACT inhaler Inhale 2 puffs into the lungs every 6 (six) hours as needed for wheezing or shortness of breath. 02/28/14  Yes Collene Gobble, MD  carboxymethylcellulose (REFRESH PLUS) 0.5 % SOLN Place 1 drop into both eyes 2 (two) times daily as needed.    Yes Historical Provider, MD  fexofenadine (ALLEGRA) 180 MG tablet Take 180 mg by mouth daily.   Yes Historical Provider, MD  losartan (COZAAR) 50 MG tablet Take 50 mg by mouth daily.  12/14/13  Yes Historical Provider, MD  Misc Natural Products (OSTEO BI-FLEX JOINT SHIELD PO) Take 1 tablet by mouth daily.   Yes Historical Provider, MD  NEXIUM 40 MG capsule Take 1 capsule (40 mg total) by mouth 2 (two) times daily before a meal. 05/23/14  Yes Mahala Menghini, PA-C  Probiotic Product (ALIGN PO) Take 1 tablet by mouth daily.   Yes Historical Provider, MD  Trospium Chloride 60 MG CP24 Take 1 capsule by mouth daily.   Yes Historical Provider, MD  Xylitol (XYLIMELTS) 500 MG DISK Use as directed 1 tablet in the mouth or throat at bedtime.   Yes Historical Provider, MD    Allergies as of 05/23/2014 - Review Complete 05/23/2014   Allergen Reaction Noted  . Dexilant [dexlansoprazole]  04/13/2014  . Latex  04/30/2010  . Percocet [oxycodone-acetaminophen]      Family History  Problem Relation Age of Onset  . Heart disease Maternal Grandfather   . Heart disease Paternal Grandmother   . Cancer Mother     lymphoma  . Allergies Mother   . Allergies Brother   . Colon cancer Neg Hx     History   Social History  . Marital Status: Married    Spouse Name: N/A    Number of Children: N/A  . Years of Education: N/A   Occupational History  . Not on file.   Social History Main Topics  . Smoking status: Former Smoker -- 1.50 packs/day for 30 years    Types: Cigarettes    Quit date: 05/24/2009  . Smokeless tobacco: Not on file     Comment: quit x 6 years  . Alcohol Use: No  . Drug Use: No  . Sexual Activity: Not on file   Other Topics Concern  . Not on file   Social History Narrative  . No narrative on file    Review of Systems: See HPI, otherwise negative ROS   Physical Exam: BP 144/75  Pulse 78  Temp(Src) 97.6 F (36.4 C) (Oral)  Resp 22  SpO2 95% General:   Alert,  pleasant and cooperative in NAD Head:  Normocephalic and atraumatic.  Neck:  Supple; Lungs:  Clear throughout to auscultation.    Heart:  Regular rate and rhythm. Abdomen:  Soft, nontender and nondistended. Normal bowel sounds, without guarding, and without rebound.   Neurologic:  Alert and  oriented x4;  grossly normal neurologically.  Impression/Plan:    DYSPEPSIA  PLAN:  EGD TODAY

## 2014-06-08 NOTE — Op Note (Signed)
[place], [id number]   ENDOSCOPY PROCEDURE REPORT  PATIENT: Cathy, Carson  MR#: [id number] BIRTHDATE: 10/22/1955 , 6  yrs. old GENDER: female  ENDOSCOPIST: Barney Drain, MD REFERRED EB:RAXE Hilma Favors, M.D. PROCEDURE DATE: 06/28/2014 PROCEDURE:   EGD w/ biopsy  INDICATIONS:dyspepsia. MEDICATIONS: Demerol 75 mg IV and Versed 6 mg IV TOPICAL ANESTHETIC:   Viscous Xylocaine ASA CLASS:  DESCRIPTION OF PROCEDURE:     Physical exam was performed.  Informed consent was obtained from the patient after explaining the benefits, risks, and alternatives to the procedure.  The patient was connected to the monitor and placed in the left lateral position.  Continuous oxygen was provided by nasal cannula and IV medicine administered through an indwelling cannula.  After administration of sedation, the patients esophagus was intubated and the EG-[id number] ([id number])  endoscope was advanced under direct visualization to the second portion of the duodenum.  The scope was removed slowly by carefully examining the color, texture, anatomy, and integrity of the mucosa on the way out.  The patient was recovered in endoscopy and discharged home in satisfactory condition.   ESOPHAGUS: The mucosa of the esophagus appeared normal.   STOMACH: A medium sized hiatal hernia was noted.   Mild and moderate non-erosive gastritis (inflammation) was found in the gastric antrum and gastric body.  Multiple biopsies were performed using cold forceps.   DUODENUM: The duodenal mucosa showed no abnormalities in the bulb and 2nd part of the duodenum. COMPLICATIONS: There were no immediate complications.  ENDOSCOPIC IMPRESSION: 1.   NO OBVIOUS SOURCE FOR DYSPEPSIA IDENTIFIED 2.   Medium sized hiatal hernia 3.   MILD Non-erosive gastritis  RECOMMENDATIONS: CONTINUE YOUR WEIGHT LOSS EFFORTS.  LOSE 10-15 LBS. STRICTLY FOLLOW A LOW FAT DIET & REFLUX PRECAUTIONS. CONTINUE NEXIUM.   TAKE 30 MINUTES BEFORE MEALS. BIOPSY WILL BE BACK IN 14 DAYS. COMPLETE A BARIUM SWALLOW NEXT WEEK. SEE DR.  Arnoldo Morale IN 2 WEEKS TO DISCUSS THE BENEFITS V.  THE RISKS OF A NISSEN FUNDOPLICATION. FOLLOW UP FEB 2016.  REPEAT EXAM: ______________________________ Lorrin MaisBarney Drain, MD June 28, 2014 12:08 PM    CPT CODES: ICD CODES:  The ICD and CPT codes recommended by this software are interpretations from the data that the clinical staff has captured with the software.  The verification of the translation of this report to the ICD and CPT codes and modifiers is the sole responsibility of the health care institution and practicing physician where this report was generated.  [facility]. will not be held responsible for the validity of the ICD and CPT codes included on this report.  AMA assumes no liability for data contained or not contained herein. CPT is a Designer, television/film set of the [employer].

## 2014-06-08 NOTE — Telephone Encounter (Signed)
Received a call from Short stay- per [facility]- pt needs in office visit in Feb and a Barium swallow next week.   Ginger, please schedule Barium swallow. Erline Levine, please nic or schedule ov

## 2014-06-08 NOTE — Progress Notes (Signed)
REVIEWED-NO ADDITIONAL RECOMMENDATIONS. 

## 2014-06-08 NOTE — Discharge Instructions (Signed)
YOU HAVE A GASTRIC POLYPS,  A HIATAL HERNIA, AND GASTRITIS. I biopsied your stomach.   CONTINUE YOUR WEIGHT LOSS EFFORTS. YOU SHOULD LOSE 10-15 LBS.  STRICTLY FOLLOW A LOW FAT DIET & REFLUX PRECAUTIONS. SEE INFO BELOW.  CONTINUE NEXIUM. TAKE 30 MINUTES BEFORE MEALS.  YOUR BIOPSY WILL BE BACK IN 14 DAYS.  COMPLETE A BARIUM SWALLOW NEXT WEEK.  SEE DR. Arnoldo Morale IN 2 WEEKS TO DISCUSS THE BENEFITS V. THE RISKS OF A NISSEN FUNDOPLICATION.  FOLLOW UP FEB 2016.   UPPER ENDOSCOPY AFTER CARE Read the instructions outlined below and refer to this sheet in the next week. These discharge instructions provide you with general information on caring for yourself after you leave the hospital. While your treatment has been planned according to the most current medical practices available, unavoidable complications occasionally occur. If you have any problems or questions after discharge, call DR. Kalyn Hofstra, [phone].  ACTIVITY  You may resume your regular activity, but move at a slower pace for the next 24 hours.   Take frequent rest periods for the next 24 hours.   Walking will help get rid of the air and reduce the bloated feeling in your belly (abdomen).   No driving for 24 hours (because of the medicine (anesthesia) used during the test).   You may shower.   Do not sign any important legal documents or operate any machinery for 24 hours (because of the anesthesia used during the test).    NUTRITION  Drink plenty of fluids.   You may resume your normal diet as instructed by your doctor.   Begin with a light meal and progress to your normal diet. Heavy or fried foods are harder to digest and may make you feel sick to your stomach (nauseated).   Avoid alcoholic beverages for 24 hours or as instructed.    MEDICATIONS  You may resume your normal medications.   WHAT YOU CAN EXPECT TODAY  Some feelings of bloating in the abdomen.   Passage of more gas than usual.    IF YOU HAD  A BIOPSY TAKEN DURING THE UPPER ENDOSCOPY:  Eat a soft diet IF YOU HAVE NAUSEA, BLOATING, ABDOMINAL PAIN, OR VOMITING.    FINDING OUT THE RESULTS OF YOUR TEST Not all test results are available during your visit. DR. Oneida Alar WILL CALL YOU WITHIN 14 DAYS OF YOUR PROCEDUE WITH YOUR RESULTS. Do not assume everything is normal if you have not heard from DR. Alithia Zavaleta IN ONE WEEK, CALL HER OFFICE AT [phone].  SEEK IMMEDIATE MEDICAL ATTENTION AND CALL THE OFFICE: [phone] IF:  You have more than a spotting of blood in your stool.   Your belly is swollen (abdominal distention).   You are nauseated or vomiting.   You have a temperature over 101F.   You have abdominal pain or discomfort that is severe or gets worse throughout the day.   Gastritis  Gastritis is an inflammation (the body's way of reacting to injury and/or infection) of the stomach. It is often caused by bacterial (germ) infections. It can also be caused BY ASPIRIN, BC/GOODY POWDER'S, (IBUPROFEN) MOTRIN, OR ALEVE (NAPROXEN), chemicals (including alcohol), SPICY FOODS, and medications. This illness may be associated with generalized malaise (feeling tired, not well), UPPER ABDOMINAL STOMACH cramps, and fever. One common bacterial cause of gastritis is an organism known as H. Pylori. This can be treated with antibiotics.   REFLUX   TREATMENT There are a number of medicines used to treat reflux including: Antacids.  ZANTAC  Proton-pump inhibitors: NEXIUM  HOME CARE INSTRUCTIONS Eat 2-3 hours before going to bed.  Try to reach and maintain a healthy weight. LOSE 10-20 LBS Do not eat just a few very large meals. Instead, eat 4 TO 6 smaller meals throughout the day.  Try to identify foods and beverages that make your symptoms worse, and avoid these.  Avoid tight clothing.  Do not exercise right after eating.   Low-Fat Diet BREADS, CEREALS, PASTA, RICE, DRIED PEAS, AND BEANS These products are high in carbohydrates and  most are low in fat. Therefore, they can be increased in the diet as substitutes for fatty foods. They too, however, contain calories and should not be eaten in excess. Cereals can be eaten for snacks as well as for breakfast.  Include foods that contain fiber (fruits, vegetables, whole grains, and legumes). Research shows that fiber may lower blood cholesterol levels, especially the water-soluble fiber found in fruits, vegetables, oat products, and legumes. FRUITS AND VEGETABLES It is good to eat fruits and vegetables. Besides being sources of fiber, both are rich in vitamins and some minerals. They help you get the daily allowances of these nutrients. Fruits and vegetables can be used for snacks and desserts. MEATS Limit lean meat, chicken, [place], and fish to no more than 6 ounces per day. Beef, Pork, and Lamb Use lean cuts of beef, pork, and lamb. Lean cuts include:  Extra-lean ground beef.  Arm roast.  Sirloin tip.  Center-cut ham.  Round steak.  Loin chops.  Rump roast.  Tenderloin.  Trim all fat off the outside of meats before cooking. It is not necessary to severely decrease the intake of red meat, but lean choices should be made. Lean meat is rich in protein and contains a highly absorbable form of iron. Premenopausal women, in particular, should avoid reducing lean red meat because this could increase the risk for low red blood cells (iron-deficiency anemia).  Chicken and [place] These are good sources of protein. The fat of poultry can be reduced by removing the skin and underlying fat layers before cooking. Chicken and [place] can be substituted for lean red meat in the diet. Poultry should not be fried or covered with high-fat sauces. Fish and Shellfish Fish is a good source of protein. Shellfish contain cholesterol, but they usually are low in saturated fatty acids. The preparation of fish is important. Like chicken and [place], they should not be fried or covered with high-fat  sauces. EGGS Egg whites contain no fat or cholesterol. They can be eaten often. Try 1 to 2 egg whites instead of whole eggs in recipes or use egg substitutes that do not contain yolk.  MILK AND DAIRY PRODUCTS Use skim or 1% milk instead of 2% or whole milk. Decrease whole milk, natural, and processed cheeses. Use nonfat or low-fat (2%) cottage cheese or low-fat cheeses made from vegetable oils. Choose nonfat or low-fat (1 to 2%) yogurt. Experiment with evaporated skim milk in recipes that call for heavy cream. Substitute low-fat yogurt or low-fat cottage cheese for sour cream in dips and salad dressings. Have at least 2 servings of low-fat dairy products, such as 2 glasses of skim (or 1%) milk each day to help get your daily calcium intake.  FATS AND OILS Butterfat, lard, and beef fats are high in saturated fat and cholesterol. These should be avoided.Vegetable fats do not contain cholesterol. AVOID coconut oil, palm oil, and palm kernel oil, WHICH are very high in saturated fats. These should be  limited. These fats are often used in bakery goods, processed foods, popcorn, oils, and nondairy creamers. Vegetable shortenings and some peanut butters contain hydrogenated oils, which are also saturated fats. Read the labels on these foods and check for saturated vegetable oils.  Desirable liquid vegetable oils are corn oil, cottonseed oil, olive oil, canola oil, safflower oil, soybean oil, and sunflower oil. Peanut oil is not as good, but small amounts are acceptable. Buy a heart-healthy tub margarine that has no partially hydrogenated oils in the ingredients. AVOID Mayonnaise and salad dressings often are made from unsaturated fats.  OTHER EATING TIPS Snacks  Most sweets should be limited as snacks. They tend to be rich in calories and fats, and their caloric content outweighs their nutritional value. Some good choices in snacks are graham crackers, melba toast, soda crackers, bagels (no egg), English  muffins, fruits, and vegetables. These snacks are preferable to snack crackers, [place] fries, and chips. Popcorn should be air-popped or cooked in small amounts of liquid vegetable oil.  Desserts Eat fruit, low-fat yogurt, and fruit ices instead of pastries, cake, and cookies. Sherbet, angel food cake, gelatin dessert, frozen low-fat yogurt, or other frozen products that do not contain saturated fat (pure fruit juice bars, frozen ice pops) are also acceptable.   COOKING METHODS Choose those methods that use little or no fat. They include: Poaching.  Braising.  Steaming.  Grilling.  Baking.  Stir-frying.  Broiling.  Microwaving.  Foods can be cooked in a nonstick pan without added fat, or use a nonfat cooking spray in regular cookware. Limit fried foods and avoid frying in saturated fat. Add moisture to lean meats by using water, broth, cooking wines, and other nonfat or low-fat sauces along with the cooking methods mentioned above. Soups and stews should be chilled after cooking. The fat that forms on top after a few hours in the refrigerator should be skimmed off. When preparing meals, avoid using excess salt. Salt can contribute to raising blood pressure in some people.  EATING AWAY FROM HOME Order entres, potatoes, and vegetables without sauces or butter. When meat exceeds the size of a deck of cards (3 to 4 ounces), the rest can be taken home for another meal. Choose vegetable or fruit salads and ask for low-calorie salad dressings to be served on the side. Use dressings sparingly. Limit high-fat toppings, such as bacon, crumbled eggs, cheese, sunflower seeds, and olives. Ask for heart-healthy tub margarine instead of butter.    Lifestyle and home remedies TO HELP CONTROL HEARTBURN.  You may eliminate or reduce the frequency of heartburn by making the following lifestyle changes:    Control your weight. Being overweight is a major risk factor for heartburn and GERD. Excess pounds put  pressure on your abdomen, pushing up your stomach and causing acid to back up into your esophagus.     Eat smaller meals. 4 TO 6 MEALS A DAY. This reduces pressure on the lower esophageal sphincter, helping to prevent the valve from opening and acid from washing back into your esophagus.     Loosen your belt. Clothes that fit tightly around your waist put pressure on your abdomen and the lower esophageal sphincter.       Eliminate heartburn triggers. Everyone has specific triggers.      Common triggers such as fatty or fried foods, spicy food, tomato sauce, carbonated beverages, alcohol, chocolate, mint, garlic, onion, caffeine and nicotine may make heartburn worse.     Avoid stooping or bending. Tying  your shoes is OK. Bending over for longer periods to weed your garden isn't, especially soon after eating.     Don't lie down after a meal. Wait at least three to four hours after eating before going to bed, and don't lie down right after eating.     PLACE THE HEAD OF YOUR BED ON 6 INCH BLOCKS.  Alternative medicine   Several home remedies exist for treating GERD, but they provide only temporary relief. They include drinking baking soda (sodium bicarbonate) added to water or drinking other fluids such as baking soda mixed with cream of tartar and water.   Although these liquids create temporary relief by neutralizing, washing away or buffering acids, eventually they aggravate the situation by adding gas and fluid to your stomach, increasing pressure and causing more acid reflux. Further, adding more sodium to your diet may increase your blood pressure and add stress to your heart, and excessive bicarbonate ingestion can alter the acid-base balance in your body.

## 2014-06-10 NOTE — Telephone Encounter (Signed)
COMPLETE A BARIUM pill esophagram THIS WEEK, Dx; DYSPHAGIA.Marland Kitchen  REFER TO DR. Arnoldo Morale IN 2 WEEKS TO DISCUSS THE BENEFITS V. THE RISKS OF A NISSEN FUNDOPLICATION, DX; UNCONTROLLED REGURGITATION/REFLUX.

## 2014-06-11 ENCOUNTER — Encounter: Payer: Self-pay | Admitting: Gastroenterology

## 2014-06-11 ENCOUNTER — Other Ambulatory Visit: Payer: Self-pay

## 2014-06-11 DIAGNOSIS — R1314 Dysphagia, pharyngoesophageal phase: Secondary | ICD-10-CM

## 2014-06-11 NOTE — Telephone Encounter (Signed)
Pt is set up for her barium pill on 10/21/@ 830

## 2014-06-11 NOTE — Telephone Encounter (Signed)
APPT MADE AND LETTER SENT  °

## 2014-06-11 NOTE — Telephone Encounter (Signed)
Referral made to Dr. Jenkins  

## 2014-06-13 ENCOUNTER — Other Ambulatory Visit (HOSPITAL_COMMUNITY): Payer: 59

## 2014-06-13 ENCOUNTER — Encounter (HOSPITAL_COMMUNITY): Payer: Self-pay | Admitting: Gastroenterology

## 2014-06-14 ENCOUNTER — Ambulatory Visit (HOSPITAL_COMMUNITY)
Admission: RE | Admit: 2014-06-14 | Discharge: 2014-06-14 | Disposition: A | Discharge status: Home / Self Care | Payer: 59 | Source: Ambulatory Visit | Attending: Gastroenterology | Admitting: Gastroenterology

## 2014-06-14 DIAGNOSIS — R1314 Dysphagia, pharyngoesophageal phase: Secondary | ICD-10-CM

## 2014-06-14 DIAGNOSIS — K219 Gastro-esophageal reflux disease without esophagitis: Secondary | ICD-10-CM | POA: Diagnosis not present

## 2014-06-14 DIAGNOSIS — K449 Diaphragmatic hernia without obstruction or gangrene: Secondary | ICD-10-CM | POA: Diagnosis not present

## 2014-06-18 ENCOUNTER — Telehealth: Payer: Self-pay | Admitting: Gastroenterology

## 2014-06-18 NOTE — Telephone Encounter (Signed)
Pt would like to hear from her biopsy report and Barium Swallow test.she has appt with Dr. Arnoldo Morale on 07/03/2014 and wants to make sure he gets all of the results that he needs.

## 2014-06-18 NOTE — Telephone Encounter (Signed)
PATIENT CALLED INQUIRING TEST RESULTS   [phone]

## 2014-06-19 NOTE — Telephone Encounter (Signed)
Pt is aware and she is still waiting for the biopsy results.

## 2014-06-19 NOTE — Telephone Encounter (Signed)
LMOM to call back

## 2014-06-19 NOTE — Telephone Encounter (Signed)
Please call pt. HER stomach Bx shows gastritis AND BENIGN STOMACH POLYPS..    CONTINUE YOUR WEIGHT LOSS EFFORTS. LOSE 10-15 LBS.  STRICTLY FOLLOW A LOW FAT DIET & REFLUX PRECAUTIONS.   CONTINUE NEXIUM. TAKE 30 MINUTES BEFORE MEALS.  FOLLOW UP FEB 2016 E30 GASTRITIS/GERD

## 2014-06-19 NOTE — Telephone Encounter (Signed)
PLEASE CALL PT. HER BARIUM SWALLOW SHOWS A NORMAL ESOPHAGUS. THE BOTTOM OF THE ESOPHAGUS REMAINS OPEN  DURING THE STUDY. IF HER ESOPHAGUS DOES NOT CLOSE AFTER SWALLOWS OR MEALS, THEN SHE WILL HAVE PROBLEMS WITH REFLUX/REGURGITATION. SURGERY MAY HELP BUT SHE WILL NEED TO DISCUSS THIS WITH DR. Arnoldo Morale.

## 2014-06-19 NOTE — Telephone Encounter (Signed)
ON RECALL LIST  °

## 2014-06-20 NOTE — Telephone Encounter (Signed)
Pt is aware of results. 

## 2014-07-04 ENCOUNTER — Encounter: Payer: Self-pay | Admitting: Gastroenterology

## 2014-07-04 ENCOUNTER — Telehealth: Payer: Self-pay | Admitting: Gastroenterology

## 2014-07-04 NOTE — Telephone Encounter (Signed)
Reviewed prior TCS report done in 2010.  Please NIC for next TCS in 08/2018

## 2014-07-04 NOTE — Telephone Encounter (Signed)
PROCEDURE ON RECALL LIST

## 2014-07-11 NOTE — H&P (Signed)
  NTS SOAP Note  Vital Signs:  Vitals as of: 23/30/0762: Systolic 263: Diastolic 88: Heart Rate 85: Temp 98.76F: Height 61ft 4.5in: Weight 176Lbs 0 Ounces: BMI 29.74  BMI : 29.74 kg/m2  Subjective: This 58 year old female presents for of reflux disease.  Has been present for many years.  Has been on multiple different regimens to control it.  Has become intractable.    Review of Symptoms:  Constitutional:unremarkable   Head:unremarkable Eyes:unremarkable   Nose/Mouth/Throat:unremarkable Cardiovascular:  unremarkable Respiratory:cough Gastrointestinnausea, heartburn Genitourinary:frequency Musculoskeletal:unremarkable dry Hematolgic/Lymphatic:unremarkable   seasonal allergies   Past Medical History:  Reviewed  Past Medical History  Surgical History: TAH,  cholecystectomy,  left ankle Medical Problems: asthma,  reflux,  HTN Allergies: latex,  dexilant Medications: nexium,  allegra,  align,  trospium, losartin     Social History:Reviewed  Social History  Preferred Language: English Race:  White Ethnicity: Not Hispanic / Latino Age: 34 year Marital Status:  M Alcohol: no   Smoking Status: Former smoker reviewed on 07/03/2014 Started Date:  Stopped Date: 07/04/1999 Functional Status reviewed on 07/03/2014 ------------------------------------------------ Bathing: Normal Cooking: Normal Dressing: Normal Driving: Normal Eating: Normal Managing Meds: Normal Oral Care: Normal Shopping: Normal Toileting: Normal Transferring: Normal Walking: Normal Cognitive Status reviewed on 07/03/2014 ------------------------------------------------ Attention: Normal Decision Making: Normal Language: Normal Memory: Normal Motor: Normal Perception: Normal Problem Solving: Normal Visual and Spatial: Normal   Family History:Reviewed  Family Health History Mother, Living; Lymphoma;  Father, Living; Healthy;     Objective Information: General:Well  appearing, well nourished in no distress. Neck:Supple without lymphadenopathy.  Heart:RRR, no murmur or gallop.  Normal S1, S2.  No S3, S4.  Lungs:  CTA bilaterally, no wheezes, rhonchi, rales.  Breathing unlabored. Abdomen:Soft, NT/ND, no HSM, no masses.  Assessment:Intractable GERD  Diagnoses: 530.81  K21.9 Acid reflux (Gastro-esophageal reflux disease without esophagitis)  Procedures: 33545 - OFFICE OUTPATIENT NEW 30 MINUTES    Plan:  Will review UGI.  Will need laparoscopic Nissen fundoplication.  Patient would like to delay scheduling until after the holidays.  Will call to schedule surgery.   Patient Education:Alternative treatments to surgery were discussed with patient (and family).  Risks and benefits  of procedure including bleeding,  infection,  perforation,  slippage of wrap,  and dysphagia were fully explained to the patient (and family) who gave informed consent. Patient/family questions were addressed.  Follow-up:Pending Surgery

## 2014-07-13 ENCOUNTER — Other Ambulatory Visit (HOSPITAL_COMMUNITY): Payer: Self-pay | Admitting: Family Medicine

## 2014-07-13 DIAGNOSIS — Z139 Encounter for screening, unspecified: Secondary | ICD-10-CM

## 2014-07-18 ENCOUNTER — Telehealth: Payer: Self-pay | Admitting: *Deleted

## 2014-07-18 NOTE — Telephone Encounter (Signed)
PA  APPROVED NEXIUM 40 MG

## 2014-07-23 ENCOUNTER — Ambulatory Visit (HOSPITAL_COMMUNITY)
Admission: RE | Admit: 2014-07-23 | Discharge: 2014-07-23 | Disposition: A | Discharge status: Home / Self Care | Payer: 59 | Source: Ambulatory Visit | Attending: Family Medicine | Admitting: Family Medicine

## 2014-07-23 DIAGNOSIS — Z139 Encounter for screening, unspecified: Secondary | ICD-10-CM

## 2014-07-23 DIAGNOSIS — Z1231 Encounter for screening mammogram for malignant neoplasm of breast: Secondary | ICD-10-CM | POA: Diagnosis not present

## 2014-07-24 NOTE — Patient Instructions (Signed)
[name]  07/24/2014   Your procedure is scheduled on:  08/03/2014  Report to [facility] at 6:15 AM.  Call this number if you have problems the morning of surgery: [phone]   Remember:   Do not eat food or drink liquids after midnight.   Take these medicines the morning of surgery with A SIP OF WATER: ALBUTEROL INHALER (BRING WITH YOU, AS WELL),  ALLEGRA, LOSARTAN, NEXIUM   Do not wear jewelry, make-up or nail polish.  Do not wear lotions, powders, or perfumes. You may wear deodorant.  Do not shave 48 hours prior to surgery. Men may shave face and neck.  Do not bring valuables to the hospital.  [facility] is not responsible for any belongings or valuables.               Contacts, dentures or bridgework may not be worn into surgery.  Leave suitcase in the car. After surgery it may be brought to your room.  For patients admitted to the hospital, discharge time is determined by your treatment team.               Patients discharged the day of surgery will not be allowed to drive home.  Name and phone number of your driver:   Special Instructions: Shower using CHG 2 nights before surgery and the night before surgery.  If you shower the day of surgery use CHG.  Use special wash - you have one bottle of CHG for all showers.  You should use approximately 1/3 of the bottle for each shower.   Please read over the following fact sheets that you were given: Surgical Site Infection Prevention and Anesthesia Post-op Instructions   PATIENT INSTRUCTIONS POST-ANESTHESIA  IMMEDIATELY FOLLOWING SURGERY:  Do not drive or operate machinery for the first twenty four hours after surgery.  Do not make any important decisions for twenty four hours after surgery or while taking narcotic pain medications or sedatives.  If you develop intractable nausea and vomiting or a severe headache please notify your doctor immediately.  FOLLOW-UP:  Please make an appointment with your surgeon as instructed.  You do not need to follow up with anesthesia unless specifically instructed to do so.  WOUND CARE INSTRUCTIONS (if applicable):  Keep a dry clean dressing on the anesthesia/puncture wound site if there is drainage.  Once the wound has quit draining you may leave it open to air.  Generally you should leave the bandage intact for twenty four hours unless there is drainage.  If the epidural site drains for more than 36-48 hours please call the anesthesia department.  QUESTIONS?:  Please feel free to call your physician or the hospital operator if you have any questions, and they will be happy to assist you.      Nissen Fundoplication Care After Please read the instructions outlined below and refer to this sheet for the next few weeks. These discharge instructions provide you with general information on caring for yourself after you leave the hospital. Your doctor may also give you specific instructions. While your treatment has been planned according to the most current medical practices available, unavoidable complications sometimes happen. If you have any problems or questions after discharge, please call your doctor. ACTIVITY  Take frequent rest periods throughout the day.  Take frequent walks throughout the day. This will help to prevent blood clots.  Continue to do your coughing and deep breathing exercises once you get home. This will help to prevent pneumonia.  No strenuous activities such as heavy lifting, pushing or pulling until after your follow-up visit with your doctor. Do not lift anything heavier than 10 pounds.  Talk with your caregiver about when you may return to work and your exercise routine.  You may shower 2 days after surgery. Pat incisions dry. Do not rub incisions with washcloth or towel.  Do not drive while taking prescription pain medication. NUTRITION  Continue with a liquid diet, or the diet you were directed to take, until your first follow-up visit with your  surgeon.  Drink fluids (6-8 glasses a day).  Call your caregiver for persistent nausea (feeling sick to your stomach), vomiting, bloating or difficulty swallowing. ELIMINATION It is very important not to strain during bowel movements. If constipation should occur, you may:  Take a mild laxative (such as Milk of Magnesia).  Add fruit and bran to your diet.  Drink more fluids.  Call your caregiver if constipation is not relieved. FEVER If you feel feverish or have shaking chills, take your temperature. If it is 11 F (38.9 C) or above, call your caregiver. The fever may mean there is an infection. PAIN CONTROL  If a prescription was given for a pain reliever, please follow your caregiver's directions.  Only take over-the-counter or prescription medicines for pain, discomfort, or fever as directed by your caregiver.  If the pain is not relieved by your medicine, becomes worse, or you have difficulty breathing, call your doctor. INCISION  It is normal for your cuts (incisions) from surgery to have a small amount of drainage for the first 1-2 days. Once the drainage has stopped, leave your incision(s) open to air.  Check your incision(s) and surrounding area daily for any redness, swelling, increased drainage or bleeding. If any of these are present or if the wound edges start to separate, call your doctor.  If you have small adhesive strips in place, they will peel and fall off. (If these strips are covered with a clear bandage, your doctor will tell you when to remove them.)  If you have staples, your caregiver will remove them at the follow-up appointment. Document Released: 04/02/2004 Document Revised: 11/02/2011 Document Reviewed: 07/07/2007 [facility] Patient Information 2015 [place], [place]. This information is not intended to replace advice given to you by your health care provider. Make sure you discuss any questions you have with your health care provider.

## 2014-07-25 ENCOUNTER — Encounter (HOSPITAL_COMMUNITY)
Admission: RE | Admit: 2014-07-25 | Discharge: 2014-07-25 | Disposition: A | Discharge status: Home / Self Care | Payer: 59 | Source: Ambulatory Visit | Attending: General Surgery | Admitting: General Surgery

## 2014-07-25 ENCOUNTER — Encounter (HOSPITAL_COMMUNITY): Payer: Self-pay

## 2014-07-25 DIAGNOSIS — Z01812 Encounter for preprocedural laboratory examination: Secondary | ICD-10-CM | POA: Diagnosis not present

## 2014-07-25 LAB — BASIC METABOLIC PANEL
Anion gap: 11 (ref 5–15)
BUN: 25 mg/dL — ABNORMAL HIGH (ref 6–23)
CO2: 27 mEq/L (ref 19–32)
Calcium: 9.2 mg/dL (ref 8.4–10.5)
Chloride: 102 mEq/L (ref 96–112)
Creatinine, Ser: 0.95 mg/dL (ref 0.50–1.10)
GFR calc Af Amer: 75 mL/min — ABNORMAL LOW (ref >90)
GFR, EST NON AFRICAN AMERICAN: 64 mL/min — AB (ref >90)
GLUCOSE: 98 mg/dL (ref 70–99)
POTASSIUM: 4.2 meq/L (ref 3.7–5.3)
SODIUM: 140 meq/L (ref 137–147)

## 2014-07-25 LAB — CBC WITH DIFFERENTIAL/PLATELET
BASOS ABS: 0 10*3/uL (ref 0.0–0.1)
Basophils Relative: 0 % (ref 0–1)
EOS PCT: 3 % (ref 0–5)
Eosinophils Absolute: 0.3 10*3/uL (ref 0.0–0.7)
HCT: 45.8 % (ref 36.0–46.0)
Hemoglobin: 15.2 g/dL — ABNORMAL HIGH (ref 12.0–15.0)
LYMPHS ABS: 2.6 10*3/uL (ref 0.7–4.0)
Lymphocytes Relative: 28 % (ref 12–46)
MCH: 29 pg (ref 26.0–34.0)
MCHC: 33.2 g/dL (ref 30.0–36.0)
MCV: 87.4 fL (ref 78.0–100.0)
Monocytes Absolute: 0.7 10*3/uL (ref 0.1–1.0)
Monocytes Relative: 7 % (ref 3–12)
NEUTROS ABS: 5.8 10*3/uL (ref 1.7–7.7)
Neutrophils Relative %: 62 % (ref 43–77)
PLATELETS: 232 10*3/uL (ref 150–400)
RBC: 5.24 MIL/uL — ABNORMAL HIGH (ref 3.87–5.11)
RDW: 13.6 % (ref 11.5–15.5)
WBC: 9.5 10*3/uL (ref 4.0–10.5)

## 2014-07-25 LAB — TYPE AND SCREEN
ABO/RH(D): O NEG
ANTIBODY SCREEN: NEGATIVE

## 2014-08-03 ENCOUNTER — Ambulatory Visit (HOSPITAL_COMMUNITY): Payer: 59 | Admitting: Anesthesiology

## 2014-08-03 ENCOUNTER — Observation Stay (HOSPITAL_COMMUNITY)
Admission: RE | Admit: 2014-08-03 | Discharge: 2014-08-05 | Disposition: A | Discharge status: Home / Self Care | Payer: 59 | Source: Ambulatory Visit | Attending: General Surgery | Admitting: General Surgery

## 2014-08-03 ENCOUNTER — Encounter (HOSPITAL_COMMUNITY): Payer: Self-pay | Admitting: *Deleted

## 2014-08-03 ENCOUNTER — Encounter (HOSPITAL_COMMUNITY)
Admission: RE | Disposition: A | Discharge status: Home / Self Care | Payer: Self-pay | Source: Ambulatory Visit | Attending: General Surgery

## 2014-08-03 DIAGNOSIS — I10 Essential (primary) hypertension: Secondary | ICD-10-CM | POA: Diagnosis not present

## 2014-08-03 DIAGNOSIS — Z79899 Other long term (current) drug therapy: Secondary | ICD-10-CM | POA: Insufficient documentation

## 2014-08-03 DIAGNOSIS — J45909 Unspecified asthma, uncomplicated: Secondary | ICD-10-CM | POA: Diagnosis not present

## 2014-08-03 DIAGNOSIS — Z91040 Latex allergy status: Secondary | ICD-10-CM | POA: Insufficient documentation

## 2014-08-03 DIAGNOSIS — Z888 Allergy status to other drugs, medicaments and biological substances status: Secondary | ICD-10-CM | POA: Insufficient documentation

## 2014-08-03 DIAGNOSIS — Z87891 Personal history of nicotine dependence: Secondary | ICD-10-CM | POA: Insufficient documentation

## 2014-08-03 DIAGNOSIS — K219 Gastro-esophageal reflux disease without esophagitis: Secondary | ICD-10-CM | POA: Diagnosis present

## 2014-08-03 HISTORY — PX: LAPAROSCOPIC NISSEN FUNDOPLICATION: SHX1932

## 2014-08-03 HISTORY — DX: Gastro-esophageal reflux disease without esophagitis: K21.9

## 2014-08-03 SURGERY — FUNDOPLICATION, NISSEN, LAPAROSCOPIC
Anesthesia: General

## 2014-08-03 MED ORDER — CHLORHEXIDINE GLUCONATE 4 % EX LIQD: 1.0000 "application " | Freq: Once | CUTANEOUS | Status: DC

## 2014-08-03 MED ORDER — ALBUTEROL SULFATE (2.5 MG/3ML) 0.083% IN NEBU
2.5000 mg | INHALATION_SOLUTION | Freq: Four times a day (QID) | RESPIRATORY_TRACT | Status: DC | PRN
Start: 2014-08-03 — End: 2014-08-05

## 2014-08-03 MED ORDER — LIDOCAINE HCL (PF) 1 % IJ SOLN
INTRAMUSCULAR | Status: AC
  Filled 2014-08-03: qty 5

## 2014-08-03 MED ORDER — DEXAMETHASONE SODIUM PHOSPHATE 4 MG/ML IJ SOLN
INTRAMUSCULAR | Status: AC
  Filled 2014-08-03: qty 1

## 2014-08-03 MED ORDER — LORATADINE 10 MG PO TABS
10.0000 mg | ORAL_TABLET | Freq: Every day | ORAL | Status: DC
  Administered 2014-08-04 – 2014-08-05 (×2): 10 mg via ORAL
  Filled 2014-08-03 (×3): qty 1

## 2014-08-03 MED ORDER — SUCCINYLCHOLINE CHLORIDE 20 MG/ML IJ SOLN
INTRAMUSCULAR | Status: AC
  Filled 2014-08-03: qty 1

## 2014-08-03 MED ORDER — SCOPOLAMINE 1 MG/3DAYS TD PT72
MEDICATED_PATCH | TRANSDERMAL | Status: AC
Start: 2014-08-03 — End: 2014-08-03
  Filled 2014-08-03: qty 1

## 2014-08-03 MED ORDER — LACTATED RINGERS IV SOLN
INTRAVENOUS | Status: DC
  Administered 2014-08-03 – 2014-08-04 (×3): via INTRAVENOUS

## 2014-08-03 MED ORDER — GLYCOPYRROLATE 0.2 MG/ML IJ SOLN
INTRAMUSCULAR | Status: DC | PRN
  Administered 2014-08-03: 0.6 mg via INTRAVENOUS

## 2014-08-03 MED ORDER — POVIDONE-IODINE 10 % OINT PACKET
TOPICAL_OINTMENT | CUTANEOUS | Status: DC | PRN
  Administered 2014-08-03: 1 via TOPICAL

## 2014-08-03 MED ORDER — FENTANYL CITRATE 0.05 MG/ML IJ SOLN
25.0000 ug | INTRAMUSCULAR | Status: DC | PRN
  Administered 2014-08-03 (×2): 50 ug via INTRAVENOUS
  Filled 2014-08-03: qty 2

## 2014-08-03 MED ORDER — POVIDONE-IODINE 10 % EX OINT
TOPICAL_OINTMENT | CUTANEOUS | Status: AC
  Filled 2014-08-03: qty 1

## 2014-08-03 MED ORDER — SUCCINYLCHOLINE CHLORIDE 20 MG/ML IJ SOLN
INTRAMUSCULAR | Status: DC | PRN
  Administered 2014-08-03: 120 mg via INTRAVENOUS

## 2014-08-03 MED ORDER — ROCURONIUM BROMIDE 50 MG/5ML IV SOLN
INTRAVENOUS | Status: AC
  Filled 2014-08-03: qty 1

## 2014-08-03 MED ORDER — PROPOFOL 10 MG/ML IV BOLUS
INTRAVENOUS | Status: AC
  Filled 2014-08-03: qty 20

## 2014-08-03 MED ORDER — FENTANYL CITRATE 0.05 MG/ML IJ SOLN
INTRAMUSCULAR | Status: AC
  Filled 2014-08-03: qty 5

## 2014-08-03 MED ORDER — GLYCOPYRROLATE 0.2 MG/ML IJ SOLN
INTRAMUSCULAR | Status: AC
  Filled 2014-08-03: qty 3

## 2014-08-03 MED ORDER — ROCURONIUM BROMIDE 100 MG/10ML IV SOLN
INTRAVENOUS | Status: DC | PRN
  Administered 2014-08-03: 10 mg via INTRAVENOUS
  Administered 2014-08-03: 5 mg via INTRAVENOUS
  Administered 2014-08-03: 10 mg via INTRAVENOUS
  Administered 2014-08-03: 25 mg via INTRAVENOUS
  Administered 2014-08-03 (×2): 10 mg via INTRAVENOUS

## 2014-08-03 MED ORDER — NEOSTIGMINE METHYLSULFATE 10 MG/10ML IV SOLN
INTRAVENOUS | Status: DC | PRN
  Administered 2014-08-03: 4 mg via INTRAVENOUS

## 2014-08-03 MED ORDER — EPHEDRINE SULFATE 50 MG/ML IJ SOLN
INTRAMUSCULAR | Status: DC | PRN
  Administered 2014-08-03 (×2): 5 mg via INTRAVENOUS

## 2014-08-03 MED ORDER — PROPOFOL 10 MG/ML IV BOLUS
INTRAVENOUS | Status: DC | PRN
  Administered 2014-08-03: 150 mg via INTRAVENOUS

## 2014-08-03 MED ORDER — CEFAZOLIN SODIUM-DEXTROSE 2-3 GM-% IV SOLR
2.0000 g | INTRAVENOUS | Status: AC
  Administered 2014-08-03: 2 g via INTRAVENOUS
  Filled 2014-08-03: qty 50

## 2014-08-03 MED ORDER — SIMETHICONE 80 MG PO CHEW
80.0000 mg | CHEWABLE_TABLET | Freq: Four times a day (QID) | ORAL | Status: DC | PRN
  Administered 2014-08-05 (×2): 80 mg via ORAL
  Filled 2014-08-03 (×2): qty 1

## 2014-08-03 MED ORDER — SODIUM CHLORIDE 0.9 % IR SOLN
Status: DC | PRN
  Administered 2014-08-03: 3000 mL
  Administered 2014-08-03: 1000 mL

## 2014-08-03 MED ORDER — ONDANSETRON HCL 4 MG/2ML IJ SOLN
INTRAMUSCULAR | Status: AC
  Filled 2014-08-03: qty 2

## 2014-08-03 MED ORDER — EPHEDRINE SULFATE 50 MG/ML IJ SOLN
INTRAMUSCULAR | Status: AC
  Filled 2014-08-03: qty 1

## 2014-08-03 MED ORDER — KETOROLAC TROMETHAMINE 30 MG/ML IJ SOLN
30.0000 mg | Freq: Once | INTRAMUSCULAR | Status: AC
  Administered 2014-08-03: 30 mg via INTRAVENOUS
  Filled 2014-08-03: qty 1

## 2014-08-03 MED ORDER — ONDANSETRON HCL 4 MG PO TABS: 4.0000 mg | ORAL_TABLET | Freq: Four times a day (QID) | ORAL | Status: DC | PRN

## 2014-08-03 MED ORDER — ONDANSETRON HCL 4 MG/2ML IJ SOLN
4.0000 mg | Freq: Once | INTRAMUSCULAR | Status: AC | PRN
  Administered 2014-08-03: 4 mg via INTRAVENOUS
  Filled 2014-08-03: qty 2

## 2014-08-03 MED ORDER — ARTIFICIAL TEARS OP OINT
TOPICAL_OINTMENT | OPHTHALMIC | Status: DC | PRN
  Administered 2014-08-03: 1 via OPHTHALMIC

## 2014-08-03 MED ORDER — FENTANYL CITRATE 0.05 MG/ML IJ SOLN
INTRAMUSCULAR | Status: DC | PRN
  Administered 2014-08-03 (×7): 50 ug via INTRAVENOUS
  Administered 2014-08-03: 100 ug via INTRAVENOUS
  Administered 2014-08-03: 50 ug via INTRAVENOUS

## 2014-08-03 MED ORDER — LACTATED RINGERS IV SOLN
INTRAVENOUS | Status: DC
  Administered 2014-08-03: 1000 mL via INTRAVENOUS

## 2014-08-03 MED ORDER — NEOSTIGMINE METHYLSULFATE 10 MG/10ML IV SOLN
INTRAVENOUS | Status: AC
  Filled 2014-08-03: qty 1

## 2014-08-03 MED ORDER — POLYVINYL ALCOHOL 1.4 % OP SOLN: 1.0000 [drp] | Freq: Two times a day (BID) | OPHTHALMIC | Status: DC | PRN

## 2014-08-03 MED ORDER — ACETAMINOPHEN 325 MG PO TABS
650.0000 mg | ORAL_TABLET | Freq: Four times a day (QID) | ORAL | Status: DC | PRN
Start: 2014-08-03 — End: 2014-08-05

## 2014-08-03 MED ORDER — LIDOCAINE HCL (CARDIAC) 20 MG/ML IV SOLN
INTRAVENOUS | Status: DC | PRN
  Administered 2014-08-03: 50 mg via INTRAVENOUS

## 2014-08-03 MED ORDER — MIDAZOLAM HCL 2 MG/2ML IJ SOLN
INTRAMUSCULAR | Status: AC
  Filled 2014-08-03: qty 2

## 2014-08-03 MED ORDER — LOSARTAN POTASSIUM 50 MG PO TABS
50.0000 mg | ORAL_TABLET | Freq: Every day | ORAL | Status: DC
  Administered 2014-08-04 – 2014-08-05 (×2): 50 mg via ORAL
  Filled 2014-08-03 (×3): qty 1

## 2014-08-03 MED ORDER — ENOXAPARIN SODIUM 40 MG/0.4ML ~~LOC~~ SOLN
40.0000 mg | SUBCUTANEOUS | Status: DC
  Administered 2014-08-04 – 2014-08-05 (×2): 40 mg via SUBCUTANEOUS
  Filled 2014-08-03 (×2): qty 0.4

## 2014-08-03 MED ORDER — ALUM & MAG HYDROXIDE-SIMETH 200-200-20 MG/5ML PO SUSP: 30.0000 mL | ORAL | Status: DC | PRN

## 2014-08-03 MED ORDER — BUPIVACAINE HCL (PF) 0.5 % IJ SOLN
INTRAMUSCULAR | Status: DC | PRN
  Administered 2014-08-03: 10 mL

## 2014-08-03 MED ORDER — SODIUM CHLORIDE 0.9 % IJ SOLN
INTRAMUSCULAR | Status: AC
  Filled 2014-08-03: qty 10

## 2014-08-03 MED ORDER — ONDANSETRON HCL 4 MG/2ML IJ SOLN: 4.0000 mg | Freq: Four times a day (QID) | INTRAMUSCULAR | Status: DC | PRN

## 2014-08-03 MED ORDER — ENOXAPARIN SODIUM 40 MG/0.4ML ~~LOC~~ SOLN
40.0000 mg | Freq: Once | SUBCUTANEOUS | Status: AC
  Administered 2014-08-03: 40 mg via SUBCUTANEOUS
  Filled 2014-08-03: qty 0.4

## 2014-08-03 MED ORDER — ARTIFICIAL TEARS OP OINT
TOPICAL_OINTMENT | OPHTHALMIC | Status: AC
  Filled 2014-08-03: qty 3.5

## 2014-08-03 MED ORDER — HYDROMORPHONE HCL 1 MG/ML IJ SOLN
1.0000 mg | INTRAMUSCULAR | Status: DC | PRN
  Administered 2014-08-03 – 2014-08-05 (×7): 1 mg via INTRAVENOUS
  Filled 2014-08-03 (×7): qty 1

## 2014-08-03 MED ORDER — LACTATED RINGERS IV SOLN
INTRAVENOUS | Status: DC | PRN
  Administered 2014-08-03 (×2): via INTRAVENOUS

## 2014-08-03 MED ORDER — DEXAMETHASONE SODIUM PHOSPHATE 4 MG/ML IJ SOLN
4.0000 mg | Freq: Once | INTRAMUSCULAR | Status: AC
  Administered 2014-08-03: 4 mg via INTRAVENOUS

## 2014-08-03 MED ORDER — ONDANSETRON HCL 4 MG/2ML IJ SOLN
4.0000 mg | Freq: Once | INTRAMUSCULAR | Status: AC
  Administered 2014-08-03: 4 mg via INTRAVENOUS

## 2014-08-03 MED ORDER — MIDAZOLAM HCL 2 MG/2ML IJ SOLN
1.0000 mg | INTRAMUSCULAR | Status: DC | PRN
  Administered 2014-08-03: 2 mg via INTRAVENOUS

## 2014-08-03 MED ORDER — BUPIVACAINE HCL (PF) 0.5 % IJ SOLN
INTRAMUSCULAR | Status: AC
  Filled 2014-08-03: qty 30

## 2014-08-03 MED ORDER — SCOPOLAMINE 1 MG/3DAYS TD PT72
1.0000 | MEDICATED_PATCH | Freq: Once | TRANSDERMAL | Status: DC
  Administered 2014-08-03: 1.5 mg via TRANSDERMAL

## 2014-08-03 MED ORDER — PANTOPRAZOLE SODIUM 40 MG IV SOLR
40.0000 mg | INTRAVENOUS | Status: DC
  Administered 2014-08-03 – 2014-08-05 (×3): 40 mg via INTRAVENOUS
  Filled 2014-08-03 (×4): qty 40

## 2014-08-03 SURGICAL SUPPLY — 60 items
APPLIER CLIP ROT 10 11.4 M/L (STAPLE)
APR CLP MED LRG 11.4X10 (STAPLE)
BAG HAMPER (MISCELLANEOUS) ×3 IMPLANT
BLADE 11 SAFETY STRL DISP (BLADE) ×1 IMPLANT
CLIP APPLIE ROT 10 11.4 M/L (STAPLE) IMPLANT
CLOTH BEACON ORANGE TIMEOUT ST (SAFETY) ×3 IMPLANT
COVER LIGHT HANDLE STERIS (MISCELLANEOUS) ×6 IMPLANT
DECANTER SPIKE VIAL GLASS SM (MISCELLANEOUS) ×3 IMPLANT
DEVICE SUTURE ENDOST 10MM (ENDOMECHANICALS) ×3 IMPLANT
DISSECTOR BLUNT TIP ENDO 5MM (MISCELLANEOUS) ×3 IMPLANT
DRAPE PROXIMA HALF (DRAPES) ×3 IMPLANT
DURAPREP 26ML APPLICATOR (WOUND CARE) ×3 IMPLANT
ELECT REM PT RETURN 9FT ADLT (ELECTROSURGICAL) ×3
ELECTRODE REM PT RTRN 9FT ADLT (ELECTROSURGICAL) ×1 IMPLANT
FILTER SMOKE EVAC LAPAROSHD (FILTER) ×3 IMPLANT
GLOVE BIOGEL PI IND STRL 7.0 (GLOVE) IMPLANT
GLOVE BIOGEL PI IND STRL 7.5 (GLOVE) IMPLANT
GLOVE BIOGEL PI INDICATOR 7.0 (GLOVE) ×4
GLOVE BIOGEL PI INDICATOR 7.5 (GLOVE) ×4
GLOVE SURG SS PI 7.0 STRL IVOR (GLOVE) ×4 IMPLANT
GLOVE SURG SS PI 7.5 STRL IVOR (GLOVE) ×5 IMPLANT
GOWN STRL REUS W/TWL LRG LVL3 (GOWN DISPOSABLE) ×9 IMPLANT
GRASPER ENDO BABCOCK 10 (MISCELLANEOUS) ×1 IMPLANT
GRASPER ENDO BABCOCK 10MM (MISCELLANEOUS) ×3
GRASPER ENDO ROTC 5X31 (INSTRUMENTS) ×3 IMPLANT
INST SET LAPROSCOPIC AP (KITS) ×3 IMPLANT
IV NS IRRIG 3000ML ARTHROMATIC (IV SOLUTION) ×3 IMPLANT
KIT ROOM TURNOVER AP CYSTO (KITS) ×3 IMPLANT
LIGASURE 5MM LAPAROSCOPIC (INSTRUMENTS) ×2 IMPLANT
LIGASURE LAP ATLAS 10MM 37CM (INSTRUMENTS) IMPLANT
MANIFOLD NEPTUNE II (INSTRUMENTS) ×3 IMPLANT
NDL HYPO 25X1 1.5 SAFETY (NEEDLE) ×1 IMPLANT
NDL INSUFFLATION 14GA 120MM (NEEDLE) ×1 IMPLANT
NEEDLE HYPO 25X1 1.5 SAFETY (NEEDLE) ×3 IMPLANT
NEEDLE INSUFFLATION 14GA 120MM (NEEDLE) ×3 IMPLANT
NS IRRIG 1000ML POUR BTL (IV SOLUTION) ×3 IMPLANT
PACK PERI GYN (CUSTOM PROCEDURE TRAY) ×3 IMPLANT
PAD ARMBOARD 7.5X6 YLW CONV (MISCELLANEOUS) ×3 IMPLANT
RETRACTOR LAPSCP 12X46 CVD (ENDOMECHANICALS) ×1 IMPLANT
RETRACTOR MAXI (MISCELLANEOUS) ×3 IMPLANT
RTRCTR LAPSCP 12X46 CVD (ENDOMECHANICALS) ×3
SET BASIN LINEN APH (SET/KITS/TRAYS/PACK) ×3 IMPLANT
SET TUBE IRRIG SUCTION NO TIP (IRRIGATION / IRRIGATOR) ×2 IMPLANT
SOLUTION ANTI FOG 6CC (MISCELLANEOUS) ×3 IMPLANT
SPONGE GAUZE 2X2 8PLY STER LF (GAUZE/BANDAGES/DRESSINGS) ×5
SPONGE GAUZE 2X2 8PLY STRL LF (GAUZE/BANDAGES/DRESSINGS) ×10 IMPLANT
STAPLER VISISTAT (STAPLE) ×3 IMPLANT
SUT SURGIDAC NAB ES-9 0 48 120 (SUTURE) ×16 IMPLANT
SUT VIC AB 4-0 PS2 27 (SUTURE) ×1 IMPLANT
SUT VICRYL 0 UR6 27IN ABS (SUTURE) ×1 IMPLANT
SYRINGE 10CC LL (SYRINGE) ×3 IMPLANT
SYRINGE 12CC LL (MISCELLANEOUS) ×2 IMPLANT
TAPE CLOTH SURG 4X10 WHT LF (GAUZE/BANDAGES/DRESSINGS) ×2 IMPLANT
TOWEL OR 17X26 4PK STRL BLUE (TOWEL DISPOSABLE) ×3 IMPLANT
TRAY FOLEY CATH 16FR SILVER (SET/KITS/TRAYS/PACK) ×1 IMPLANT
TROCAR Z-THRD FIOS HNDL 11X100 (TROCAR) ×3 IMPLANT
TROCAR Z-THREAD SLEEVE 11X100 (TROCAR) ×12 IMPLANT
TUBING HI FLO HEAT INSUFFLATOR (IRRIGATION / IRRIGATOR) ×3 IMPLANT
TUBING INSUFFLATION HIGH FLOW (TUBING) ×3 IMPLANT
WARMER LAPAROSCOPE (MISCELLANEOUS) ×3 IMPLANT

## 2014-08-03 NOTE — Anesthesia Preprocedure Evaluation (Addendum)
Anesthesia Evaluation  Patient identified by MRN, date of birth, ID band Patient awake    Reviewed: Allergy & Precautions, H&P , NPO status , Patient's Chart, lab work & pertinent test results  History of Anesthesia Complications (+) PONV and history of anesthetic complications  Airway Mallampati: III  TM Distance: >3 FB     Dental  (+) Teeth Intact   Pulmonary former smoker,  allergies breath sounds clear to auscultation        Cardiovascular hypertension, Pt. on medications Rhythm:Regular Rate:Normal     Neuro/Psych    GI/Hepatic GERD-  Poorly Controlled,  Endo/Other    Renal/GU      Musculoskeletal   Abdominal   Peds  Hematology   Anesthesia Other Findings   Reproductive/Obstetrics                            Anesthesia Physical Anesthesia Plan  ASA: II  Anesthesia Plan: General   Post-op Pain Management:    Induction: Intravenous, Rapid sequence and Cricoid pressure planned  Airway Management Planned: Oral ETT and Video Laryngoscope Planned  Additional Equipment:   Intra-op Plan:   Post-operative Plan: Extubation in OR  Informed Consent: I have reviewed the patients History and Physical, chart, labs and discussed the procedure including the risks, benefits and alternatives for the proposed anesthesia with the patient or authorized representative who has indicated his/her understanding and acceptance.     Plan Discussed with:   Anesthesia Plan Comments:         Anesthesia Quick Evaluation

## 2014-08-03 NOTE — Anesthesia Postprocedure Evaluation (Signed)
  Anesthesia Post-op Note  Patient: [name]  Procedure(s) Performed: Procedure(s): LAPAROSCOPIC NISSEN FUNDOPLICATION (N/A)  Patient Location: PACU  Anesthesia Type:General  Level of Consciousness: awake, alert , oriented and patient cooperative  Airway and Oxygen Therapy: Patient Spontanous Breathing and Patient connected to face mask oxygen  Post-op Pain: mild  Post-op Assessment: Post-op Vital signs reviewed, Patient's Cardiovascular Status Stable, Respiratory Function Stable, Patent Airway, No signs of Nausea or vomiting and Pain level controlled  Post-op Vital Signs: Reviewed and stable  Last Vitals:  Filed Vitals:   08/03/14 1018  BP: 147/80  Pulse:   Temp: 36.3 C  Resp:     Complications: No apparent anesthesia complications

## 2014-08-03 NOTE — Op Note (Signed)
Patient:  [name]  DOB:  01/20/1956  MRN:  [id number]   Preop Diagnosis:  Intractable gastroesophageal reflux disease  Postop Diagnosis:  Same  Procedure:  Laparoscopic Nissen fundoplication  Surgeon:  Aviva Signs, M.D.  Anes:  Gen. endotracheal  Indications:  Patient is a 58 year old white female who presents with intractable gastroesophageal reflux disease. The risks and benefits of procedure including bleeding, infection, perforation, and the possibility of dysphagia were fully explained to the patient, who gave informed consent.  Procedure note:  The patient was placed in low lithotomy position after induction of general endotracheal anesthesia. The abdomen was prepped and draped using usual sterile technique with ChloraPrep. Surgical site confirmation was performed.  A Veress needle was introduced into an incision along the midline, midway between the xiphoid process and the umbilicus. Confirmation of placement was done using the saline drop test. The abdomen was then insufflated to 16 mmHg pressure. An 11 mm trocar was then introduced and into the abdominal cavity under direct visualization without difficulty. The patient was placed in reverse Trendelenburg position and additional 11 mm trocar was placed left flank region. Left upper quadrant, right upper quadrant, and right flank regions. All were placed under direct visual guidance. A paddle retractor was used to retract the liver. The gastrohepatic omentum along the lesser curvature of the upper stomach was divided using the LigaSure up to the hiatus. Both the left and right crura were identified, the left crura be identified after the short gastrics were taken down using the LigaSure. A window was then formed posteriorly behind the gastroesophageal juncture. The gastroesophageal junction was at the hiatus. A posterior crural repair was performed using 0 Ethibond interrupted sutures 2. Next, the wrap was then brought posteriorly  in the Nissen-like fashion. It was secured anteriorly to the esophagus and to the stomach using 0 Ethibond interrupted sutures. A relaxed wrap was done. The area of the was then inspected no was no evidence of perforation or bleeding. All fluid and air were then evacuated from the abdominal cavity prior to removal of the trochars.  All wounds were irrigated with normal saline. All wounds were injected with 0.5% Sensorcaine. All wounds were closed using staples. Betadine ointment and dry sterile dressings were applied.  All tape and needle counts were correct at the end of the procedure. The patient was extubated in the operating room and transferred to PACU in stable condition.  Complications:  None  EBL:  Less than 25 mL  Specimen:  None

## 2014-08-03 NOTE — Interval H&P Note (Signed)
History and Physical Interval Note:  08/03/2014 7:17 AM  [name]  has presented today for surgery, with the diagnosis of gerd  The various methods of treatment have been discussed with the patient and family. After consideration of risks, benefits and other options for treatment, the patient has consented to  Procedure(s): LAPAROSCOPIC NISSEN FUNDOPLICATION (N/A) as a surgical intervention .  The patient's history has been reviewed, patient examined, no change in status, stable for surgery.  I have reviewed the patient's chart and labs.  Questions were answered to the patient's satisfaction.     Aviva Signs A

## 2014-08-03 NOTE — Anesthesia Procedure Notes (Signed)
Procedure Name: Intubation Date/Time: 08/03/2014 7:47 AM Performed by: Andree Elk, Cannon Quinton A Pre-anesthesia Checklist: Patient identified, Patient being monitored, Timeout performed, Emergency Drugs available and Suction available Patient Re-evaluated:Patient Re-evaluated prior to inductionOxygen Delivery Method: Circle System Utilized Preoxygenation: Pre-oxygenation with 100% oxygen Intubation Type: IV induction, Rapid sequence and Cricoid Pressure applied Grade View: Grade I Tube type: Oral Tube size: 7.0 mm Number of attempts: 1 Airway Equipment and Method: stylet and Video-laryngoscopy Placement Confirmation: ETT inserted through vocal cords under direct vision,  positive ETCO2 and breath sounds checked- equal and bilateral Secured at: 21 cm Tube secured with: Tape Dental Injury: Teeth and Oropharynx as per pre-operative assessment

## 2014-08-03 NOTE — Transfer of Care (Signed)
Immediate Anesthesia Transfer of Care Note  Patient: [name]  Procedure(s) Performed: Procedure(s): LAPAROSCOPIC NISSEN FUNDOPLICATION (N/A)  Patient Location: PACU  Anesthesia Type:General  Level of Consciousness: sedated and patient cooperative  Airway & Oxygen Therapy: Patient Spontanous Breathing and Patient connected to face mask oxygen  Post-op Assessment: Report given to PACU RN and Post -op Vital signs reviewed and stable  Post vital signs: Reviewed and stable  Complications: No apparent anesthesia complications

## 2014-08-04 DIAGNOSIS — K219 Gastro-esophageal reflux disease without esophagitis: Secondary | ICD-10-CM | POA: Diagnosis not present

## 2014-08-04 LAB — BASIC METABOLIC PANEL
Anion gap: 13 (ref 5–15)
BUN: 15 mg/dL (ref 6–23)
CHLORIDE: 101 meq/L (ref 96–112)
CO2: 25 mEq/L (ref 19–32)
Calcium: 8.8 mg/dL (ref 8.4–10.5)
Creatinine, Ser: 0.89 mg/dL (ref 0.50–1.10)
GFR calc Af Amer: 81 mL/min — ABNORMAL LOW (ref >90)
GFR, EST NON AFRICAN AMERICAN: 70 mL/min — AB (ref >90)
Glucose, Bld: 116 mg/dL — ABNORMAL HIGH (ref 70–99)
Potassium: 4.4 mEq/L (ref 3.7–5.3)
SODIUM: 139 meq/L (ref 137–147)

## 2014-08-04 LAB — CBC
HCT: 39.7 % (ref 36.0–46.0)
Hemoglobin: 13.2 g/dL (ref 12.0–15.0)
MCH: 29.1 pg (ref 26.0–34.0)
MCHC: 33.2 g/dL (ref 30.0–36.0)
MCV: 87.6 fL (ref 78.0–100.0)
Platelets: 234 10*3/uL (ref 150–400)
RBC: 4.53 MIL/uL (ref 3.87–5.11)
RDW: 13.6 % (ref 11.5–15.5)
WBC: 10.1 10*3/uL (ref 4.0–10.5)

## 2014-08-04 MED ORDER — MENTHOL 3 MG MT LOZG: 1.0000 | LOZENGE | OROMUCOSAL | Status: DC | PRN

## 2014-08-04 NOTE — Addendum Note (Signed)
Addendum  created 08/04/14 1154 by Mickel Baas, CRNA   Modules edited: Notes Section   Notes Section:  File: [id number]

## 2014-08-04 NOTE — Progress Notes (Signed)
UR completed   Sharian Delia,RN, BSN 

## 2014-08-04 NOTE — Progress Notes (Signed)
1 Day Post-Op  Subjective: Moderate incisional pain with some dysphagia.  Objective: Vital signs in last 24 hours: Temp:  [96.4 F (35.8 C)-98.6 F (37 C)] 98.5 F (36.9 C) (12/12 0527) Pulse Rate:  [71-92] 90 (12/12 0527) Resp:  [13-16] 15 (12/12 0527) BP: (102-149)/(48-77) 125/70 mmHg (12/12 0527) SpO2:  [91 %-97 %] 91 % (12/12 0527) Weight:  [82.358 kg (181 lb 9.1 oz)] 82.358 kg (181 lb 9.1 oz) (12/11 1156) Last BM Date: 08/03/14  Intake/Output from previous day: 12/11 0701 - 12/12 0700 In: 2358.3 [I.V.:2358.3] Out: 910 [Urine:900; Blood:10] Intake/Output this shift: Total I/O In: -  Out: 400 [Urine:400]  General appearance: alert, cooperative and no distress Resp: clear to auscultation bilaterally Cardio: regular rate and rhythm, S1, S2 normal, no murmur, click, rub or gallop GI: Soft. Dressings dry and intact.  Lab Results:   Recent Labs  08/04/14 0528  WBC 10.1  HGB 13.2  HCT 39.7  PLT 234   BMET  Recent Labs  08/04/14 0528  NA 139  K 4.4  CL 101  CO2 25  GLUCOSE 116*  BUN 15  CREATININE 0.89  CALCIUM 8.8   PT/INR No results for input(s): LABPROT, INR in the last 72 hours.  Studies/Results: No results found.  Anti-infectives: Anti-infectives    Start     Dose/Rate Route Frequency Ordered Stop   08/03/14 0616  ceFAZolin (ANCEF) IVPB 2 g/50 mL premix     2 g100 mL/hr over 30 Minutes Intravenous On call to O.R. 08/03/14 0616 08/03/14 0749      Assessment/Plan: s/p Procedure(s): LAPAROSCOPIC NISSEN FUNDOPLICATION Impression: Moderate incisional pain which is to be expected. Does have some dysphagia and has only tolerated clear liquid diet. Will try full liquid diet today. Continue IV pain control and IV nausea control.  LOS: 1 day    Kay Shippy A 08/04/2014

## 2014-08-04 NOTE — Anesthesia Postprocedure Evaluation (Signed)
  Anesthesia Post-op Note  Patient: [name]  Procedure(s) Performed: Procedure(s): LAPAROSCOPIC NISSEN FUNDOPLICATION (N/A)  Patient Location: Room 336  Anesthesia Type:General  Level of Consciousness: awake, alert , oriented and patient cooperative  Airway and Oxygen Therapy: Patient Spontanous Breathing  Post-op Pain: moderate  Post-op Assessment: Post-op Vital signs reviewed, Patient's Cardiovascular Status Stable, Respiratory Function Stable, Patent Airway and Pain level controlled  Post-op Vital Signs: Reviewed and stable  Last Vitals:  Filed Vitals:   08/04/14 0527  BP: 125/70  Pulse: 90  Temp: 36.9 C  Resp: 15    Complications: No apparent anesthesia complications

## 2014-08-04 NOTE — Addendum Note (Signed)
Addendum  created 08/04/14 1148 by Mickel Baas, CRNA   Modules edited: Charges VN

## 2014-08-05 DIAGNOSIS — K219 Gastro-esophageal reflux disease without esophagitis: Secondary | ICD-10-CM | POA: Diagnosis not present

## 2014-08-05 LAB — BASIC METABOLIC PANEL
ANION GAP: 11 (ref 5–15)
BUN: 11 mg/dL (ref 6–23)
CHLORIDE: 102 meq/L (ref 96–112)
CO2: 26 meq/L (ref 19–32)
Calcium: 8.6 mg/dL (ref 8.4–10.5)
Creatinine, Ser: 0.8 mg/dL (ref 0.50–1.10)
GFR calc Af Amer: >90 mL/min (ref >90)
GFR calc non Af Amer: 79 mL/min — ABNORMAL LOW (ref >90)
GLUCOSE: 102 mg/dL — AB (ref 70–99)
Potassium: 3.9 mEq/L (ref 3.7–5.3)
SODIUM: 139 meq/L (ref 137–147)

## 2014-08-05 LAB — CBC
HCT: 39.3 % (ref 36.0–46.0)
HEMOGLOBIN: 13.2 g/dL (ref 12.0–15.0)
MCH: 29.5 pg (ref 26.0–34.0)
MCHC: 33.6 g/dL (ref 30.0–36.0)
MCV: 87.9 fL (ref 78.0–100.0)
PLATELETS: 175 10*3/uL (ref 150–400)
RBC: 4.47 MIL/uL (ref 3.87–5.11)
RDW: 13.7 % (ref 11.5–15.5)
WBC: 8.9 10*3/uL (ref 4.0–10.5)

## 2014-08-05 MED ORDER — NEXIUM 40 MG PO CPDR: 40.0000 mg | DELAYED_RELEASE_CAPSULE | Freq: Every morning | ORAL | Status: DC

## 2014-08-05 MED ORDER — HYDROCODONE-ACETAMINOPHEN 5-325 MG PO TABS: 1.0000 | ORAL_TABLET | Freq: Four times a day (QID) | ORAL | Status: DC | PRN

## 2014-08-05 NOTE — Progress Notes (Signed)
[name] discharged home with husband per MD order.  Discharge instructions reviewed and discussed with the patient, all questions and concerns answered. Copy of instructions and scripts given to patient.    Medication List    TAKE these medications        albuterol 108 (90 BASE) MCG/ACT inhaler  Commonly known as:  PROVENTIL HFA;VENTOLIN HFA  Inhale 2 puffs into the lungs every 6 (six) hours as needed for wheezing or shortness of breath.     ALIGN PO  Take 1 tablet by mouth daily.     carboxymethylcellulose 0.5 % Soln  Commonly known as:  REFRESH PLUS  Place 1 drop into both eyes 2 (two) times daily as needed.     fexofenadine 180 MG tablet  Commonly known as:  ALLEGRA  Take 180 mg by mouth daily.     HYDROcodone-acetaminophen 5-325 MG per tablet  Commonly known as:  NORCO/VICODIN  Take 1-2 tablets by mouth every 6 (six) hours as needed for moderate pain.     losartan 50 MG tablet  Commonly known as:  COZAAR  Take 50 mg by mouth daily.     NEXIUM 40 MG capsule  Generic drug:  esomeprazole  Take 1 capsule (40 mg total) by mouth every morning.     OSTEO BI-FLEX JOINT SHIELD PO  Take 1 tablet by mouth daily.     Trospium Chloride 60 MG Cp24  Take 1 capsule by mouth daily.     XYLIMELTS 500 MG Disk  Generic drug:  Xylitol  Use as directed 1 tablet in the mouth or throat at bedtime.        Patients skin is clean, dry and intact, surgical incisions are clean, dry, and intact at this time. IV site discontinued and catheter remains intact. Site without signs and symptoms of complications. Dressing and pressure applied.  Patient escorted to car by Lovena Le, RN in a wheelchair,  no distress noted upon discharge.  Regino Bellow 08/05/2014 11:28 AM

## 2014-08-05 NOTE — Discharge Instructions (Signed)
Nissen Fundoplication Care After Please read the instructions outlined below and refer to this sheet for the next few weeks. These discharge instructions provide you with general information on caring for yourself after you leave the hospital. Your doctor may also give you specific instructions. While your treatment has been planned according to the most current medical practices available, unavoidable complications sometimes happen. If you have any problems or questions after discharge, please call your doctor. ACTIVITY  Take frequent rest periods throughout the day.  Take frequent walks throughout the day. This will help to prevent blood clots.  Continue to do your coughing and deep breathing exercises once you get home. This will help to prevent pneumonia.  No strenuous activities such as heavy lifting, pushing or pulling until after your follow-up visit with your doctor. Do not lift anything heavier than 10 pounds.  Talk with your caregiver about when you may return to work and your exercise routine.  You may shower 2 days after surgery. Pat incisions dry. Do not rub incisions with washcloth or towel.  Do not drive while taking prescription pain medication. NUTRITION  Continue with a liquid diet, or the soft diet you were directed to take, until your first follow-up visit with your surgeon.  Drink fluids (6-8 glasses a day).  Call your caregiver for persistent nausea (feeling sick to your stomach), vomiting, bloating or difficulty swallowing. ELIMINATION It is very important not to strain during bowel movements. If constipation should occur, you may:  Take a mild laxative (such as Milk of Magnesia).  Add fruit and bran to your diet.  Drink more fluids.  Call your caregiver if constipation is not relieved. FEVER If you feel feverish or have shaking chills, take your temperature. If it is 28 F (38.9 C) or above, call your caregiver. The fever may mean there is an  infection. PAIN CONTROL  If a prescription was given for a pain reliever, please follow your caregiver's directions.  Only take over-the-counter or prescription medicines for pain, discomfort, or fever as directed by your caregiver.  If the pain is not relieved by your medicine, becomes worse, or you have difficulty breathing, call your doctor. INCISION  It is normal for your cuts (incisions) from surgery to have a small amount of drainage for the first 1-2 days. Once the drainage has stopped, leave your incision(s) open to air.  Check your incision(s) and surrounding area daily for any redness, swelling, increased drainage or bleeding. If any of these are present or if the wound edges start to separate, call your doctor.  If you have small adhesive strips in place, they will peel and fall off. (If these strips are covered with a clear bandage, your doctor will tell you when to remove them.)  If you have staples, your caregiver will remove them at the follow-up appointment. Document Released: 04/02/2004 Document Revised: 11/02/2011 Document Reviewed: 07/07/2007 [facility] Patient Information 2015 [place], [place]. This information is not intended to replace advice given to you by your health care provider. Make sure you discuss any questions you have with your health care provider.

## 2014-08-05 NOTE — Progress Notes (Signed)
UR completed 

## 2014-08-05 NOTE — Discharge Summary (Signed)
Physician Discharge Summary  Patient ID: [name] MRN: [id number] DOB/AGE: February 18, 1956 58 y.o.  Admit date: 08/03/2014 Discharge date: 08/05/2014  Admission Diagnoses: Intractable gastroesophageal reflux disease  Discharge Diagnoses: Same Active Problems:   Acid reflux disease   Discharged Condition: good  Hospital Course: Patient is a 58 year old white female with intractable gastroesophageal reflux disease secondary to a sliding hiatal hernia who underwent laparoscopic Nissen fundoplication on 45/10/8880. Her postoperative course was remarkable for moderate incisional pain and dysphagia. This has improved over the past 24 hours and the patient is being discharged home on 08/05/2014 in good improving condition. She was given strict instructions on a post fundoplication diet.  Treatments: surgery: Laparoscopic Nissen fundoplication on 80/10/4915  Discharge Exam: Blood pressure 154/74, pulse 88, temperature 99.6 F (37.6 C), temperature source Oral, resp. rate 17, height 5\' 4"  (1.626 m), weight 82.358 kg (181 lb 9.1 oz), SpO2 90 %. General appearance: alert, cooperative and no distress Resp: clear to auscultation bilaterally Cardio: regular rate and rhythm, S1, S2 normal, no murmur, click, rub or gallop GI: Soft. Incisions healing well.  Disposition: 01-Home or Self Care     Medication List    TAKE these medications        albuterol 108 (90 BASE) MCG/ACT inhaler  Commonly known as:  PROVENTIL HFA;VENTOLIN HFA  Inhale 2 puffs into the lungs every 6 (six) hours as needed for wheezing or shortness of breath.     ALIGN PO  Take 1 tablet by mouth daily.     carboxymethylcellulose 0.5 % Soln  Commonly known as:  REFRESH PLUS  Place 1 drop into both eyes 2 (two) times daily as needed.     fexofenadine 180 MG tablet  Commonly known as:  ALLEGRA  Take 180 mg by mouth daily.     HYDROcodone-acetaminophen 5-325 MG per tablet  Commonly known as:  NORCO/VICODIN  Take 1-2  tablets by mouth every 6 (six) hours as needed for moderate pain.     losartan 50 MG tablet  Commonly known as:  COZAAR  Take 50 mg by mouth daily.     NEXIUM 40 MG capsule  Generic drug:  esomeprazole  Take 1 capsule (40 mg total) by mouth every morning.     OSTEO BI-FLEX JOINT SHIELD PO  Take 1 tablet by mouth daily.     Trospium Chloride 60 MG Cp24  Take 1 capsule by mouth daily.     XYLIMELTS 500 MG Disk  Generic drug:  Xylitol  Use as directed 1 tablet in the mouth or throat at bedtime.           Follow-up Information    Follow up with Jamesetta So, MD. Schedule an appointment as soon as possible for a visit on 08/14/2014.   Specialty:  General Surgery   Contact information:   [address] [place] [address] [phone]       Signed: Aviva Signs A 08/05/2014, 10:35 AM

## 2014-08-06 ENCOUNTER — Encounter (HOSPITAL_COMMUNITY): Payer: Self-pay | Admitting: General Surgery

## 2014-10-12 ENCOUNTER — Ambulatory Visit (INDEPENDENT_AMBULATORY_CARE_PROVIDER_SITE_OTHER): Payer: 59 | Admitting: Gastroenterology

## 2014-10-12 ENCOUNTER — Encounter: Payer: Self-pay | Admitting: Gastroenterology

## 2014-10-12 VITALS — BP 135/75 | HR 107 | Temp 98.0°F | Ht 64.0 in | Wt 156.8 lb

## 2014-10-12 DIAGNOSIS — R072 Precordial pain: Secondary | ICD-10-CM | POA: Insufficient documentation

## 2014-10-12 DIAGNOSIS — Z9889 Other specified postprocedural states: Secondary | ICD-10-CM

## 2014-10-12 DIAGNOSIS — R1010 Upper abdominal pain, unspecified: Secondary | ICD-10-CM | POA: Diagnosis not present

## 2014-10-12 DIAGNOSIS — K219 Gastro-esophageal reflux disease without esophagitis: Secondary | ICD-10-CM

## 2014-10-12 NOTE — Progress Notes (Signed)
Primary Care Physician: Purvis Kilts, MD  Primary Gastroenterologist:  Barney Drain, MD   Chief Complaint  Patient presents with  . Follow-up    HPI: [name] is a 59 y.o. female here for follow-up. She was seen last fall with complaints of refractory GERD. She reported that her pulmonologist and dentist were concerned her GERD was causing dental caries, cough, peribronchial vascular nodularity and interstitial changes. Previously had done well on branded Nexium but had to change to generic due to insurance. Switched to [employer] but she developed diarrhea. Previously failed Prilosec, Prevacid, and generic Nexium.  EGD in October 2015 showed medium sized hiatal hernia, mild nonerosive gastritis.  She also had a barium esophagram in October which showed small to moderate sized hiatal hernia, gastroesophageal junction appeared widely patent/remained open throughout the study. She had Nissen fundoplication in 45/6256.   At this time she remains on Nexium once daily but was able to cut back from BID. Denies heartburn but afraid to stop the Nexium. She is having pain which is right of midline and into back.  Acute onset, last hours happens 1-2 times per week. Has to lay down. Stabbing. Ibuprofen. Muscle relaxer BID. Had some similar pain but to lesser degree prior to surgery. Thought it was related to hiatal hernia at that time. Pain happens when sitting in chair. Tries to walk it off. No nocturnal pain. Still has issues with eating certain foods, like bread/crust. Comes back up. Much improved further out from surgery.      Current Outpatient Prescriptions  Medication Sig Dispense Refill  . carboxymethylcellulose (REFRESH PLUS) 0.5 % SOLN Place 1 drop into both eyes 2 (two) times daily as needed.     . Cyclobenzaprine HCl (FLEXERIL PO) Take by mouth 2 (two) times daily.    . fexofenadine (ALLEGRA) 180 MG tablet Take 180 mg by mouth daily.    Marland Kitchen losartan (COZAAR) 50 MG tablet  Take 50 mg by mouth daily.     . Misc Natural Products (OSTEO BI-FLEX JOINT SHIELD PO) Take 1 tablet by mouth daily.    Marland Kitchen NEXIUM 40 MG capsule Take 1 capsule (40 mg total) by mouth every morning. 60 capsule 5  . Trospium Chloride 60 MG CP24 Take 1 capsule by mouth daily.    . Xylitol (XYLIMELTS) 500 MG DISK Use as directed 1 tablet in the mouth or throat at bedtime.     No current facility-administered medications for this visit.    Allergies as of 10/12/2014 - Review Complete 10/12/2014  Allergen Reaction Noted  . Dexilant [dexlansoprazole]  04/13/2014  . Percocet [oxycodone-acetaminophen]    . Latex Rash 04/30/2010   Past Medical History  Diagnosis Date  . High blood pressure   . Allergy   . Heartburn   . GERD (gastroesophageal reflux disease)    Past Surgical History  Procedure Laterality Date  . Gallbladder surgery    . Abdominal hysterectomy    . Video bronchoscopy Bilateral 01/23/2014    Procedure: VIDEO BRONCHOSCOPY WITHOUT FLUORO;  Surgeon: Collene Gobble, MD;  Location: [facility];  Service: Cardiopulmonary;  Laterality: Bilateral;  . Eus  05/21/10    outlaw:hyperechoic liver mild consistent with steatosis otherwise normal [place]  . Esophagogastroduodenoscopy N/A 06/08/2014    SLF: 1. No obvious source for dyspepsia identified. 2. Medium sized hiatal hernia 3. Mild non-erosive gastritis.  . Colonoscopy  2010    Dr. Aviva Signs: four polyps in rectum and sigmoid colon (hyperplastic  polyps)  . Esophagogastroduodenoscopy  2010    Dr. Aviva Signs: hiatal hernia  . Tonsillectomy    . Cholecystectomy    . Laparoscopic nissen fundoplication N/A 44/62/8638    Procedure: LAPAROSCOPIC NISSEN FUNDOPLICATION;  Surgeon: Jamesetta So, MD;  Location: [facility];  Service: General;  Laterality: N/A;    ROS:  General: Negative for anorexia, weight loss, fever, chills, fatigue, weakness. ENT: Negative for hoarseness, difficulty swallowing , nasal congestion. CV: Negative for chest  pain, angina, palpitations, dyspnea on exertion, peripheral edema.  Respiratory: Negative for dyspnea at rest, dyspnea on exertion, cough, sputum, wheezing.  GI: See history of present illness. GU:  Negative for dysuria, hematuria, urinary incontinence, urinary frequency, nocturnal urination.  Endo: Negative for unusual weight change.    Physical Examination:   BP 135/75 mmHg  Pulse 107  Temp(Src) 98 F (36.7 C)  Ht 5\' 4"  (1.626 m)  Wt 156 lb 12.8 oz (71.124 kg)  BMI 26.90 kg/m2  General: Well-nourished, well-developed in no acute distress.  Eyes: No icterus. Mouth: Oropharyngeal mucosa moist and pink , no lesions erythema or exudate. Lungs: Clear to auscultation bilaterally.  Heart: Regular rate and rhythm, no murmurs rubs or gallops.  Abdomen: Bowel sounds are normal, mild epigastric tenderness, nondistended, no hepatosplenomegaly or masses, no abdominal bruits or hernia , no rebound or guarding.   Extremities: No lower extremity edema. No clubbing or deformities. Neuro: Alert and oriented x 4   Skin: Warm and dry, no jaundice.   Psych: Alert and cooperative, normal mood and affect.  Labs:  Lab Results  Component Value Date   WBC 8.9 08/05/2014   HGB 13.2 08/05/2014   HCT 39.3 08/05/2014   MCV 87.9 08/05/2014   PLT 175 08/05/2014   Lab Results  Component Value Date   CREATININE 0.80 08/05/2014   BUN 11 08/05/2014   NA 139 08/05/2014   K 3.9 08/05/2014   CL 102 08/05/2014   CO2 26 08/05/2014    Imaging Studies: No results found.

## 2014-10-12 NOTE — Patient Instructions (Signed)
1. I will discuss your pain with Dr. Oneida Alar first of the week. You may need imaging. Further recommendations to follow.

## 2014-10-15 NOTE — Assessment & Plan Note (Signed)
59 y/o female s/p Nissen fundoplication in 68/3419 who presents with complaints of substernal/retrosternal especially right of midline with radiation into back. Worse since Nissen fundoplication. Initially treated as muscle spasm by chiropractor and Dr. Arnoldo Morale without improvement. Sounds like spasm by history. Her swallowing continues to improve postoperatively and typical GERD well managed. To discuss with Dr. Oneida Alar. ?need imaging chest/upper abd for further evaluation of ongoing pain.

## 2014-10-16 NOTE — Progress Notes (Signed)
REVIEWED. BPE WITHIN 7-10 DAYS.

## 2014-10-16 NOTE — Progress Notes (Signed)
cc'ed to pcp °

## 2014-10-19 NOTE — Addendum Note (Signed)
Addended by: Mahala Menghini on: 10/19/2014 02:56 PM   Modules accepted: Orders

## 2014-10-19 NOTE — Progress Notes (Signed)
Please let patient know she needs a barium pill esophagram and UGI series to evaluate substernal chest pain/upper abdominal pain s/p Nissen fundoplication.

## 2014-10-24 ENCOUNTER — Telehealth: Payer: Self-pay | Admitting: Gastroenterology

## 2014-10-24 NOTE — Telephone Encounter (Signed)
Routing to Leslie Lewis, PA.  

## 2014-10-24 NOTE — Progress Notes (Signed)
LMOM for pt to call and that she does need to do another study, and to please call right away.

## 2014-10-24 NOTE — Progress Notes (Signed)
Called pt to arrange tests. Pt is scheduled for 11/01/2014 @ 950am at [place]. Unable to leave message on machine. Will call back

## 2014-10-24 NOTE — Telephone Encounter (Signed)
PATIENT CALLED STATING THAT [facility] WAS GOING TO SPEAK TO DR FIELDS REGARDING HER PAIN MANAGEMENT,  PLEASE ADVISE IF ANYTHING HAS BEEN DECIDED.  [phone]

## 2014-10-24 NOTE — Progress Notes (Signed)
[name] is aware.She needs you to call her before you schedule due to her changing schedule.

## 2014-10-29 NOTE — Telephone Encounter (Signed)
Looks like this has been taken care of. I had addressed "next step" back on 10/19/14 and patient has been scheduled for BPE/UGI as planned.  Doris, is there anything else that patient was needing prior to the test.

## 2014-11-01 ENCOUNTER — Ambulatory Visit (HOSPITAL_COMMUNITY)
Admission: RE | Admit: 2014-11-01 | Discharge: 2014-11-01 | Disposition: A | Discharge status: Home / Self Care | Payer: 59 | Source: Ambulatory Visit | Attending: Gastroenterology | Admitting: Gastroenterology

## 2014-11-01 DIAGNOSIS — Z9889 Other specified postprocedural states: Secondary | ICD-10-CM | POA: Insufficient documentation

## 2014-11-01 DIAGNOSIS — K224 Dyskinesia of esophagus: Secondary | ICD-10-CM | POA: Insufficient documentation

## 2014-11-01 DIAGNOSIS — R072 Precordial pain: Secondary | ICD-10-CM

## 2014-11-01 DIAGNOSIS — K219 Gastro-esophageal reflux disease without esophagitis: Secondary | ICD-10-CM

## 2014-11-01 DIAGNOSIS — R1010 Upper abdominal pain, unspecified: Secondary | ICD-10-CM | POA: Insufficient documentation

## 2014-11-01 DIAGNOSIS — R1310 Dysphagia, unspecified: Secondary | ICD-10-CM | POA: Diagnosis not present

## 2014-11-02 NOTE — Progress Notes (Signed)
Quick Note:  Barium tablet obstructed at GEJ. Mild dilation of the esophagus. Wrap noted (surgery 07/2014). To discuss findings with Dr. Oneida Alar. ______

## 2014-11-08 NOTE — Progress Notes (Signed)
Quick Note:  Please let patient know the findings. I am awaiting further input from Dr. Oneida Alar. ______

## 2014-11-13 NOTE — Progress Notes (Signed)
Quick Note:  PT is aware. ______ 

## 2014-11-27 NOTE — Progress Notes (Signed)
Quick Note:  Please let the patient know I'm sorry for the delay. Unfortunately I have no further recommendations from Dr. Oneida Alar other than she suggested patient follow up with her surgeon if she is still having pain as described at time of OV. I would advise she tell surgeon about the UGI series, we can fax results to surgeon.   Tablet obstructed in distal esophagus on study. Patient denied significant dysphagia at time of OV. Use plenty of liquids when taking pills and eating, sit upright at least 30 minutes after eating/drinking/taking medications.   Return for OV here ([facility] only) if she has worsening swallowing issues, worsening pain, gerd, other gi concerns.      ______

## 2014-11-27 NOTE — Progress Notes (Signed)
Quick Note:  LMOM to call. ______ 

## 2014-11-29 NOTE — Progress Notes (Signed)
Quick Note:  Pt returned call. She said she has not had a bad episode in a couple of weeks. She said Dr. Arnoldo Morale was very adamant on her follow up visits that it was not related to surgery.  She is aware that Neil Crouch, PA said she would talk to him for any advice.  She is following instructions for drinking plenty of liquids when taking pills and sitting upright after she eats. ______

## 2014-12-05 NOTE — Progress Notes (Signed)
Quick Note:  I spoke with Dr. Arnoldo Morale today per Dr. Oneida Alar request. Consensus is that if patient has ongoing problems, then we should send her to GI at [facility] first to see if they would recommend manometry/impedence or other. They may consider sending her to a surgeon there for second opinion.   Her wrap appears intake on barium study without evidence of slippage. Notable tighter in distal esophagus due to the surgery. If she has issues swallowing, there could be consideration to dilation but patient really didn't complain of this previously. ______

## 2015-01-03 NOTE — Progress Notes (Signed)
Quick Note:  [name], looks like I routed this to myself instead of you. See the 12/05/14 documentation. ______

## 2015-01-07 NOTE — Progress Notes (Signed)
Quick Note:  LMOM ( both numbers) to call. ______

## 2015-01-07 NOTE — Progress Notes (Signed)
Quick Note:  I called and informed pt. She said her pain is NOT related to swallowing. She had pain in her chest when she has the episodes. It feels like the pain goes deep. Sometimes it last for 15-20 min, and sometimes for hours. She would appreciate a phone call from Neil Crouch, [place] to discuss before she is referred to [facility].  She works night ans sleeps days. Best time to return her call is: Tues: Before 9:00 AM Wed: after 2:00 PM ______

## 2015-01-09 ENCOUNTER — Encounter: Payer: Self-pay | Admitting: Gastroenterology

## 2015-01-09 NOTE — Progress Notes (Signed)
Quick Note:  Spoke with patient. All questions answered. She wants referral to [facility] for ongoing atypical chest pain s/p fundoplication. ______

## 2015-01-09 NOTE — Progress Notes (Signed)
Quick Note:  Routing to [place] to do referral. ______

## 2015-01-11 ENCOUNTER — Other Ambulatory Visit: Payer: Self-pay

## 2015-01-11 DIAGNOSIS — Z9889 Other specified postprocedural states: Secondary | ICD-10-CM

## 2015-01-28 ENCOUNTER — Telehealth: Payer: Self-pay

## 2015-01-28 NOTE — Telephone Encounter (Signed)
Pt is calling since her surgery in Dec.She has been on a muscle relaxer(Flexeril 10mg  BID) for the epig chest pain. She has a follow up appointment at [facility] on 02/11/15 and wanted to know if she could have enough until the appointment at [facility]. Please advise

## 2015-01-29 MED ORDER — CYCLOBENZAPRINE HCL 10 MG PO TABS: 10.0000 mg | ORAL_TABLET | Freq: Two times a day (BID) | ORAL | Status: DC

## 2015-01-29 NOTE — Telephone Encounter (Signed)
We did not prescribe it originally but YES I can do that.

## 2015-01-29 NOTE — Telephone Encounter (Signed)
Pt is aware.  

## 2015-02-11 DIAGNOSIS — R079 Chest pain, unspecified: Secondary | ICD-10-CM | POA: Insufficient documentation

## 2015-06-27 ENCOUNTER — Ambulatory Visit: Payer: 59 | Admitting: Podiatry

## 2015-07-02 ENCOUNTER — Ambulatory Visit (INDEPENDENT_AMBULATORY_CARE_PROVIDER_SITE_OTHER): Payer: 59 | Admitting: Podiatry

## 2015-07-02 ENCOUNTER — Encounter: Payer: Self-pay | Admitting: Podiatry

## 2015-07-02 VITALS — BP 124/67 | HR 109 | Resp 16

## 2015-07-02 DIAGNOSIS — M2011 Hallux valgus (acquired), right foot: Secondary | ICD-10-CM | POA: Diagnosis not present

## 2015-07-02 DIAGNOSIS — Q828 Other specified congenital malformations of skin: Secondary | ICD-10-CM | POA: Diagnosis not present

## 2015-07-02 NOTE — Progress Notes (Signed)
   Subjective:    Patient ID: [name], female    DOB: December 04, 1955, 59 y.o.   MRN: [id number]  HPI: She presents today with a chief complaint of a painful callus sub-fifth left. She would like to have it trimmed.    Review of Systems  All other systems reviewed and are negative.      Objective:   Physical Exam: She presents today vital signs stable alert and oriented 3. No apparent distress. Pulses are strongly palpable neurologic sensorium is intact deep tendon reflexes are intact muscle strength is intact bilateral. Orthopedic evaluation demonstrates mild tailor bunion deformities bilateral left greater than right. Cutaneous evaluation of a straight supple well-hydrated cutis no erythema edema saline as strange or odor no open wounds or lesions. Reactive hyperkeratosis with a porokeratotic lesion sub-fifth metatarsophalangeal joint bilateral.        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  Plan: Sharp debridement of the lesion today and will follow up with her on an as-needed basis. We discussed conservative therapies inserts.

## 2015-07-11 ENCOUNTER — Other Ambulatory Visit: Payer: Self-pay | Admitting: Gastroenterology

## 2015-07-17 ENCOUNTER — Other Ambulatory Visit (HOSPITAL_COMMUNITY): Payer: Self-pay | Admitting: Family Medicine

## 2015-07-17 DIAGNOSIS — Z1231 Encounter for screening mammogram for malignant neoplasm of breast: Secondary | ICD-10-CM

## 2015-07-25 ENCOUNTER — Ambulatory Visit (HOSPITAL_COMMUNITY)
Admission: RE | Admit: 2015-07-25 | Discharge: 2015-07-25 | Disposition: A | Discharge status: Home / Self Care | Payer: 59 | Source: Ambulatory Visit | Attending: Family Medicine | Admitting: Family Medicine

## 2015-07-25 DIAGNOSIS — Z1231 Encounter for screening mammogram for malignant neoplasm of breast: Secondary | ICD-10-CM | POA: Diagnosis not present

## 2015-08-22 ENCOUNTER — Other Ambulatory Visit: Payer: Self-pay | Admitting: Nurse Practitioner

## 2015-11-15 IMAGING — CT CT CHEST W/O CM
2 of 3 series · 15 of 36 positions shown, 18 images · non-contrast
Comparison: Chest CT 05/02/2012, chest radiograph 12/04/2013

CLINICAL DATA: Abnormal CT chest, followup, history smoking

EXAM:
CT CHEST WITHOUT CONTRAST
TECHNIQUE: Multidetector CT imaging of the chest was performed following the
standard protocol without IV contrast. Sagittal and coronal MPR
images reconstructed from axial data set.

[Series 2: chestroutine 5.0 b40f · axial · 0.66mm/px · z∈[-446,-186]mm · 12 of 62 slices shown, 15 images]
[im 5/62  mediastinal]
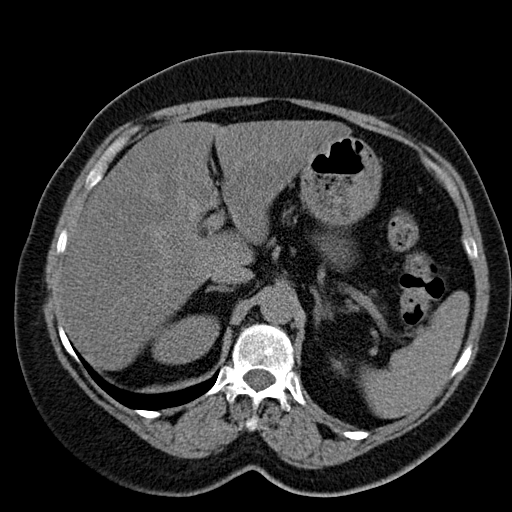
[im 5/62  lung]
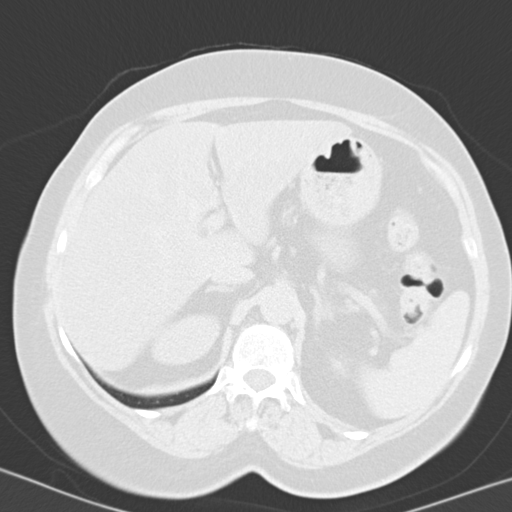
[im 10/62  lung]
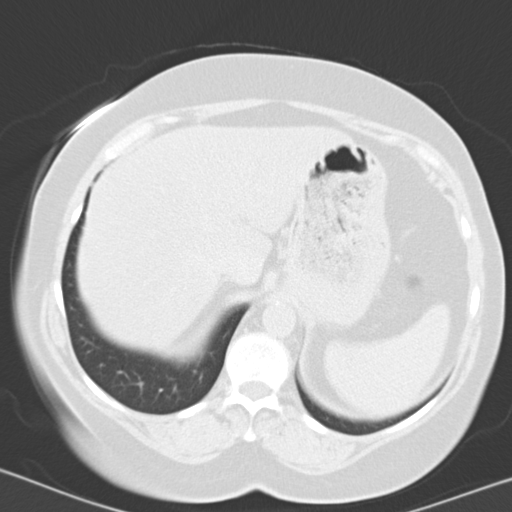
[im 14/62  lung]
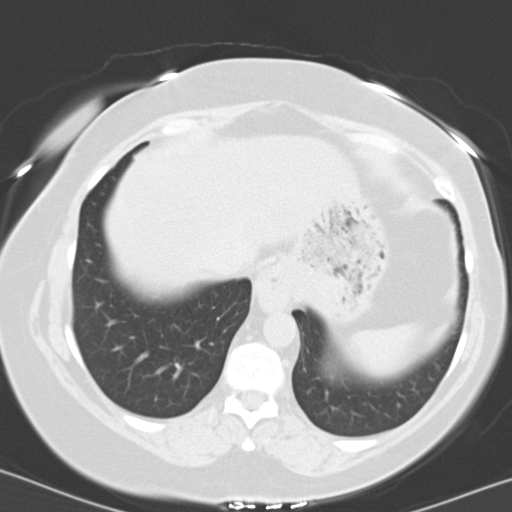
[im 19/62  lung]
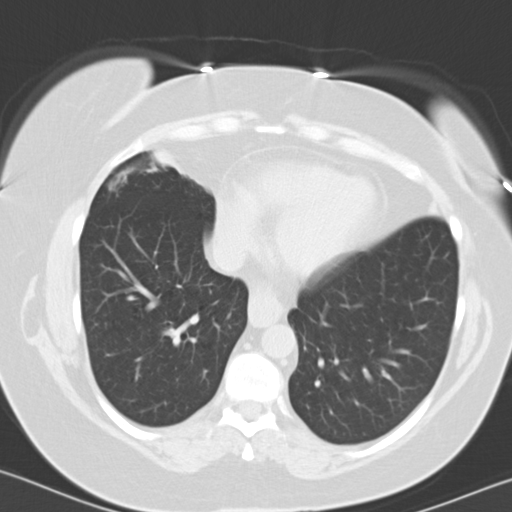
[im 23/62  mediastinal]
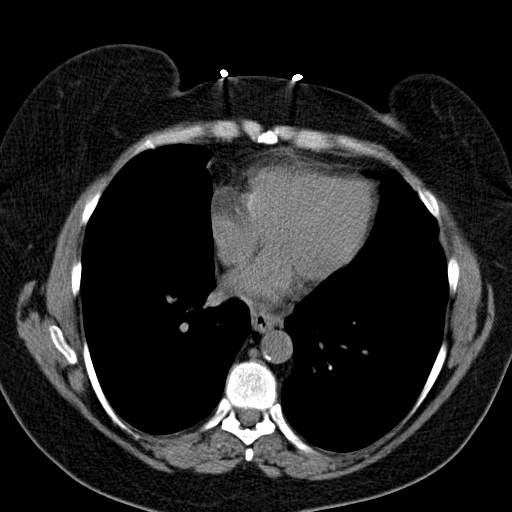
[im 23/62  lung]
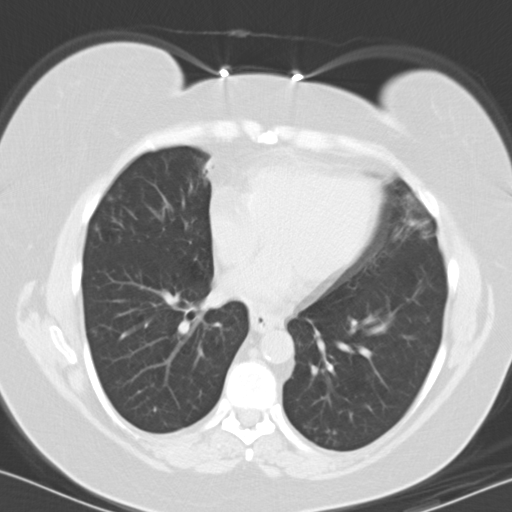
[im 28/62  lung]
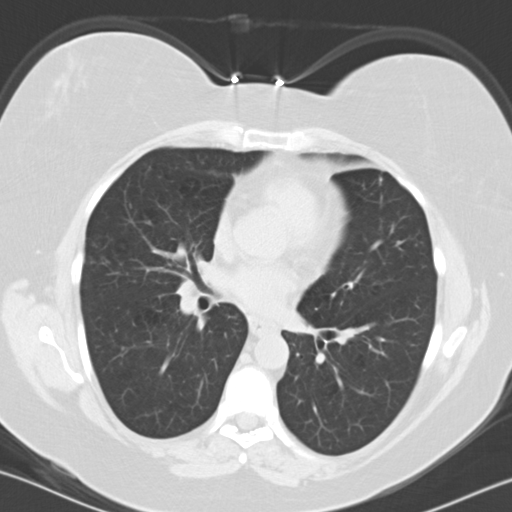
[im 34/62  lung]
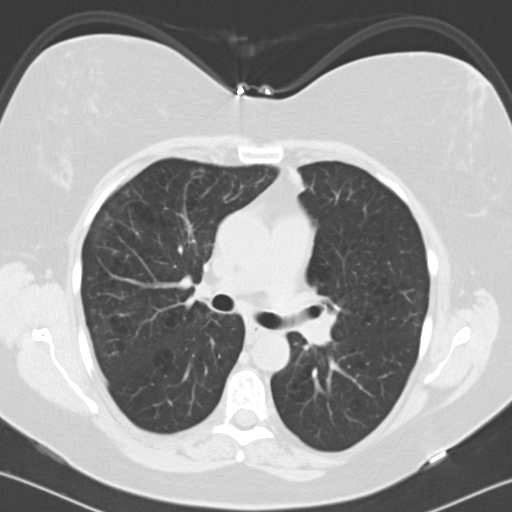
[im 39/62  lung]
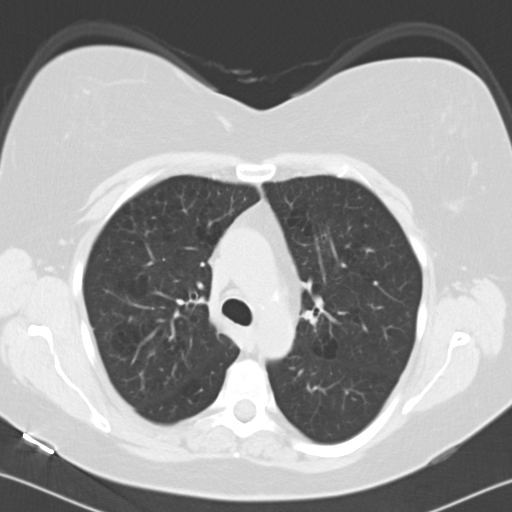
[im 43/62  mediastinal]
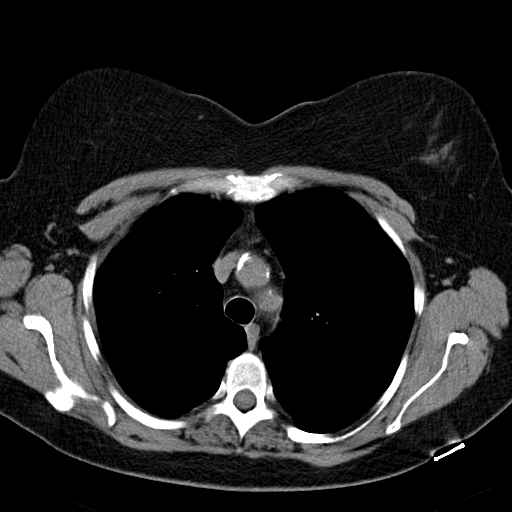
[im 43/62  lung]
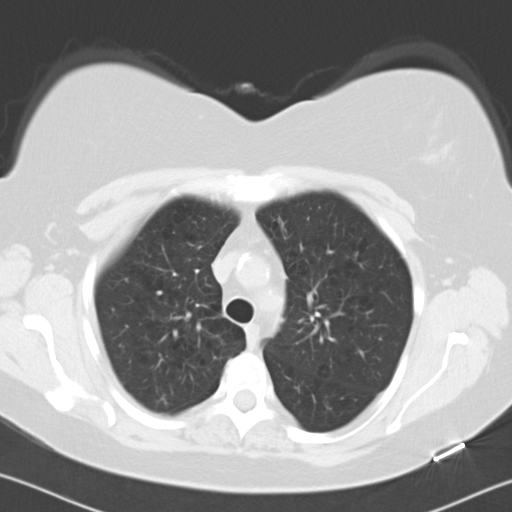
[im 48/62  lung]
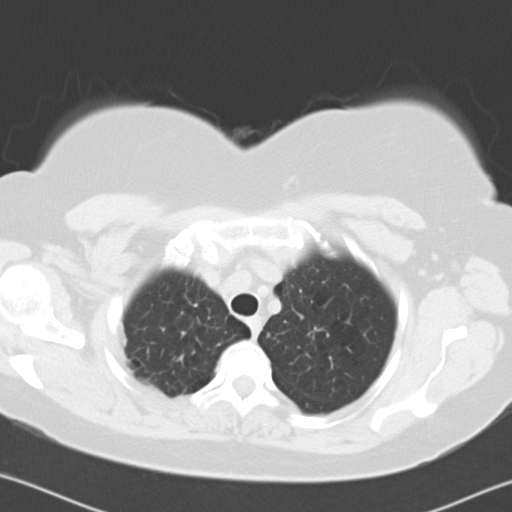
[im 52/62  lung]
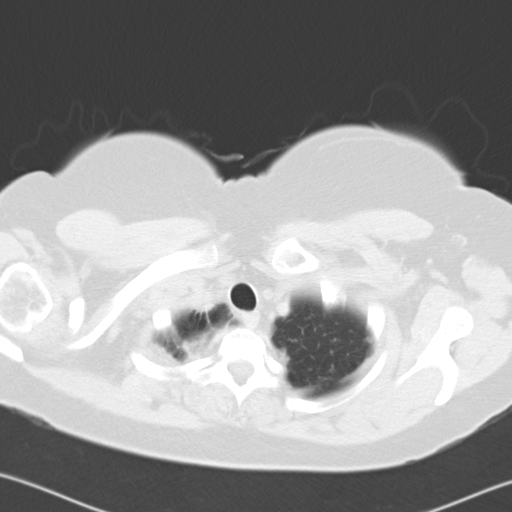
[im 57/62  lung]
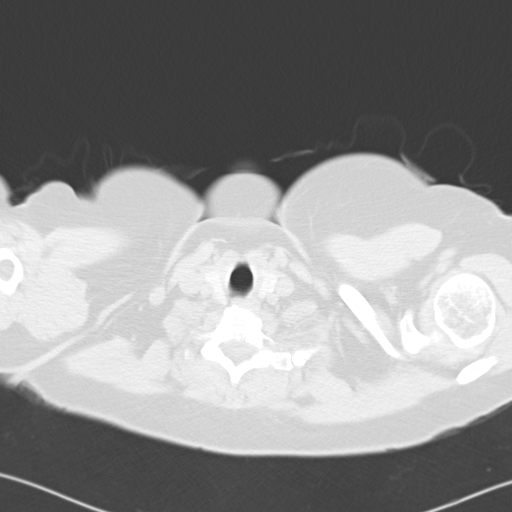

[Series 4: mpr coro 3mm · coronal · 0.62mm/px · 3 of 88 slices shown]
[im 18/88  lung]
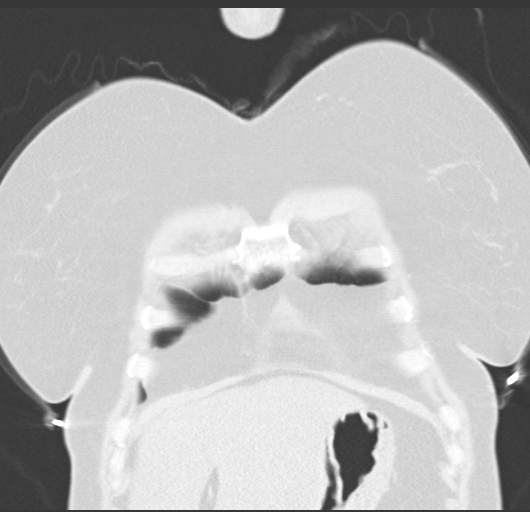
[im 35/88  lung]
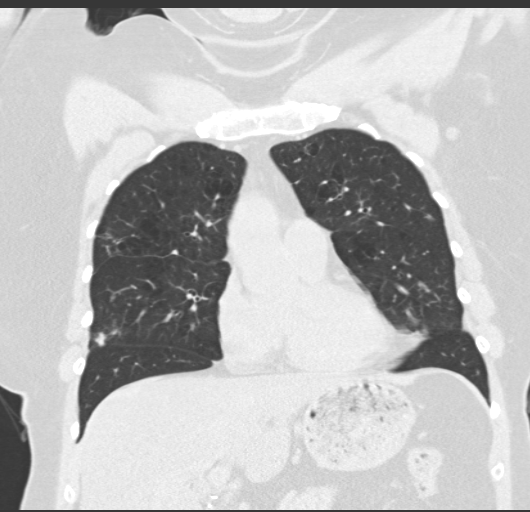
[im 53/88  lung]
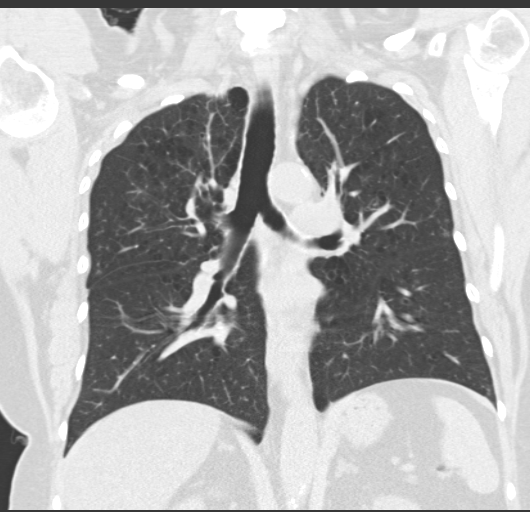

[15 of 36 positions shown; findings below may reference images not displayed]

FINDINGS: Gallbladder surgically absent.

Mild fatty infiltration of liver.

Remaining visualized portions of upper abdomen unremarkable.

Small hiatal hernia.

Mild scattered atherosclerotic calcification aorta.

No thoracic adenopathy.

Tiny lipoma of RIGHT latissimus dorsi 2.6 x 1.1 cm.

Emphysematous changes with minimal central peribronchial thickening.

Scattered interstitial changes are identified in the periphery of
both lungs, greater in upper lobes, predominantly
peribronchovascular and slightly nodular, increased since previous
exam.

Minimal scattered bronchiectasis.

5 mm RIGHT middle lobe nodule image 37 unchanged.

Increased peripheral density in the anterolateral RIGHT middle lobe
base, suspect scarring.

No segmental infiltrate, pleural effusion or pneumothorax.

Bones demineralized without acute osseous findings.
IMPRESSION: Slightly increased peribronchovascular nodularity and interstitial
changes greater in the periphery of the upper lobes, likely
postinflammatory or postinfectious, remains compatible with
mycobacterium avium complex (ALPHAMIKECHOROMEO).

Stable RIGHT middle lobe nodule with increased RIGHT middle lobe
scarring.

Underlying emphysematous and bronchitic changes.

Fatty infiltration of liver.

Small hiatal hernia.

## 2016-05-07 DIAGNOSIS — E119 Type 2 diabetes mellitus without complications: Secondary | ICD-10-CM | POA: Insufficient documentation

## 2016-06-05 ENCOUNTER — Encounter: Payer: 59 | Attending: Physician Assistant | Admitting: Skilled Nursing Facility1

## 2016-06-05 ENCOUNTER — Encounter: Payer: Self-pay | Admitting: Skilled Nursing Facility1

## 2016-06-05 DIAGNOSIS — E119 Type 2 diabetes mellitus without complications: Secondary | ICD-10-CM | POA: Diagnosis present

## 2016-06-05 DIAGNOSIS — Z713 Dietary counseling and surveillance: Secondary | ICD-10-CM | POA: Insufficient documentation

## 2016-06-05 NOTE — Progress Notes (Signed)
Diabetes Self-Management Education  Visit Type: First/Initial  Appt. Start Time: 9:00 Appt. End Time: 10:00  06/05/2016  [name], identified by name and date of birth, is a 60 y.o. female with a diagnosis of Diabetes: Type 2.   ASSESSMENT  Height 5\' 4"  (1.626 m), weight 169 lb 12.8 oz (77 kg). Body mass index is 29.15 kg/m. Pt states she works 12 hour shifts. Pt checked her blood sugar which was 158 about 1.5 hours after she had eaten.      Diabetes Self-Management Education - 06/05/16 0853      Visit Information   Visit Type First/Initial     Initial Visit   Diabetes Type Type 2   Are you currently following a meal plan? No   Are you taking your medications as prescribed? Not on Medications   Date Diagnosed 05/06/2016     Health Coping   How would you rate your overall health? Good     Psychosocial Assessment   Patient Belief/Attitude about Diabetes Afraid   Self-management support Friends;Family     Pre-Education Assessment   Patient understands the diabetes disease and treatment process. Needs Instruction   Patient understands incorporating nutritional management into lifestyle. Needs Instruction   Patient undertands incorporating physical activity into lifestyle. Needs Instruction   Patient understands using medications safely. Needs Instruction   Patient understands monitoring blood glucose, interpreting and using results Needs Instruction   Patient understands prevention, detection, and treatment of acute complications. Needs Instruction   Patient understands prevention, detection, and treatment of chronic complications. Needs Instruction   Patient understands how to develop strategies to address psychosocial issues. Needs Instruction   Patient understands how to develop strategies to promote health/change behavior. Needs Instruction     Complications   Last HgB A1C per patient/outside source 6.4 %   How often do you check your blood sugar? 0 times/day  (not testing)   Have you had a dilated eye exam in the past 12 months? Yes   Have you had a dental exam in the past 12 months? Yes   Are you checking your feet? No     Dietary Intake   Breakfast cereal----eanglish muffin---bagel   Snack (morning) cheese and nuts   Lunch leftovers: meat and potatoes   Snack (afternoon) cheese and nuts   Dinner leftovers: meat and potatoes   Snack (evening) cookies   Beverage(s) water, soda     Exercise   Exercise Type Light (walking / raking leaves)   How many days per week to you exercise? 3   How many minutes per day do you exercise? 45   Total minutes per week of exercise 135     Patient Education   Previous Diabetes Education No   Disease state  Definition of diabetes, type 1 and 2, and the diagnosis of diabetes   Nutrition management  Role of diet in the treatment of diabetes and the relationship between the three main macronutrients and blood glucose level;Food label reading, portion sizes and measuring food.;Carbohydrate counting;Reviewed blood glucose goals for pre and post meals and how to evaluate the patients' food intake on their blood glucose level.;Information on hints to eating out and maintain blood glucose control.   Physical activity and exercise  Role of exercise on diabetes management, blood pressure control and cardiac health.   Monitoring Taught/evaluated SMBG meter.;Purpose and frequency of SMBG.;Interpreting lab values - A1C, lipid, urine microalbumina.;Identified appropriate SMBG and/or A1C goals.   Psychosocial adjustment Worked with patient to  identify barriers to care and solutions     Individualized Goals (developed by patient)   Nutrition Follow meal plan discussed;Adjust meds/carbs with exercise as discussed   Physical Activity Exercise 3-5 times per week;15 minutes per day   Monitoring  test my blood glucose as discussed     Post-Education Assessment   Patient understands the diabetes disease and treatment process.  Demonstrates understanding / competency   Patient understands incorporating nutritional management into lifestyle. Demonstrates understanding / competency   Patient undertands incorporating physical activity into lifestyle. Demonstrates understanding / competency   Patient understands using medications safely. Demonstrates understanding / competency   Patient understands monitoring blood glucose, interpreting and using results Demonstrates understanding / competency   Patient understands prevention, detection, and treatment of acute complications. Demonstrates understanding / competency   Patient understands prevention, detection, and treatment of chronic complications. Demonstrates understanding / competency   Patient understands how to develop strategies to address psychosocial issues. Demonstrates understanding / competency   Patient understands how to develop strategies to promote health/change behavior. Demonstrates understanding / competency     Outcomes   Expected Outcomes Demonstrated interest in learning. Expect positive outcomes   Future DMSE PRN   Program Status Completed      Individualized Plan for Diabetes Self-Management Training:   Learning Objective:  Patient will have a greater understanding of diabetes self-management. Patient education plan is to attend individual and/or group sessions per assessed needs and concerns.   Plan:   Patient Instructions  -Always bring your meter with you everywhere you go -Always Properly dispose of your needles:  -Discard in a hard plastic/metal container with a lid (something the needle can't puncture)  -Write Do Not Recycle on the outside of the container  -Example: A laundry detergent bottle -Never use the same needle more than once -Eat 2-3 carbohydrate choices for each meal and 1 for each snack -A meal: carbohydrates, protein, vegetable -A snack: A Fruit OR Vegetable AND Protein  -Try to be more active -Always pay attention to  your body keeping watchful of possible low blood sugar (below 70) or high blood sugar (above 200)  -American Diabetes Association for more information   -Check your blood sugar either after you have been sleeping or 2 hours after you have eaten: you are looking for either under 130 after you have slept or under 180 2 hours after you have eaten   Expected Outcomes:  Demonstrated interest in learning. Expect positive outcomes  Education material provided: Living Well with Diabetes, Meal plan card and My Plate  If problems or questions, patient to contact team via:  Phone  Future DSME appointment: PRN

## 2016-06-05 NOTE — Patient Instructions (Signed)
-  Always bring your meter with you everywhere you go -Always Properly dispose of your needles:  -Discard in a hard plastic/metal container with a lid (something the needle can't puncture)  -Write Do Not Recycle on the outside of the container  -Example: A laundry detergent bottle -Never use the same needle more than once -Eat 2-3 carbohydrate choices for each meal and 1 for each snack -A meal: carbohydrates, protein, vegetable -A snack: A Fruit OR Vegetable AND Protein  -Try to be more active -Always pay attention to your body keeping watchful of possible low blood sugar (below 70) or high blood sugar (above 200)  -American Diabetes Association for more information   -Check your blood sugar either after you have been sleeping or 2 hours after you have eaten: you are looking for either under 130 after you have slept or under 180 2 hours after you have eaten

## 2016-06-24 ENCOUNTER — Other Ambulatory Visit (HOSPITAL_COMMUNITY): Payer: Self-pay | Admitting: Physician Assistant

## 2016-06-24 DIAGNOSIS — Z1231 Encounter for screening mammogram for malignant neoplasm of breast: Secondary | ICD-10-CM

## 2016-06-30 ENCOUNTER — Ambulatory Visit (INDEPENDENT_AMBULATORY_CARE_PROVIDER_SITE_OTHER): Payer: 59 | Admitting: Emergency Medicine

## 2016-06-30 ENCOUNTER — Encounter: Payer: Self-pay | Admitting: Emergency Medicine

## 2016-06-30 VITALS — BP 120/70 | HR 86 | Ht 64.5 in | Wt 166.8 lb

## 2016-06-30 DIAGNOSIS — R911 Solitary pulmonary nodule: Secondary | ICD-10-CM

## 2016-06-30 DIAGNOSIS — K219 Gastro-esophageal reflux disease without esophagitis: Secondary | ICD-10-CM

## 2016-06-30 DIAGNOSIS — R9389 Abnormal findings on diagnostic imaging of other specified body structures: Secondary | ICD-10-CM

## 2016-06-30 DIAGNOSIS — J4520 Mild intermittent asthma, uncomplicated: Secondary | ICD-10-CM | POA: Diagnosis not present

## 2016-06-30 DIAGNOSIS — R938 Abnormal findings on diagnostic imaging of other specified body structures: Secondary | ICD-10-CM | POA: Diagnosis not present

## 2016-06-30 MED ORDER — ALBUTEROL SULFATE HFA 108 (90 BASE) MCG/ACT IN AERS: 2.0000 | INHALATION_SPRAY | Freq: Four times a day (QID) | RESPIRATORY_TRACT | 5 refills | Status: DC | PRN

## 2016-06-30 MED ORDER — ALBUTEROL SULFATE HFA 108 (90 BASE) MCG/ACT IN AERS
2.0000 | INHALATION_SPRAY | Freq: Four times a day (QID) | RESPIRATORY_TRACT | 5 refills | Status: DC | PRN
Start: 2016-06-30 — End: 2017-07-23

## 2016-06-30 NOTE — Addendum Note (Signed)
Addended by: Desmond Dike C on: 06/30/2016 12:11 PM   Modules accepted: Orders

## 2016-06-30 NOTE — Assessment & Plan Note (Signed)
S/p Nissen and on Nexium bid

## 2016-06-30 NOTE — Assessment & Plan Note (Signed)
She only appears to experience symptoms during allergy season or when she has an upper respiratory infection. We will continue albuterol as needed. Refill this for her today.

## 2016-06-30 NOTE — Patient Instructions (Addendum)
We will repeat your Ct scan of the chest to compare with priors.  Continue to have allegra or zyrtec available to use during the Spring Allergy season.  We will refill your prescription for albuterol to use 2 puffs as needed for shortness of breath or wheeze.  Follow with Dr Lamonte Sakai after your CT scan to review the results.

## 2016-06-30 NOTE — Assessment & Plan Note (Signed)
Parabronchial nodularity and interstitial changes peripherally with some micronodular disease, possibly mycobacterial disease although bronchoscopy was negative in 2015. I believe she needs a repeat CT scan of the chest and we will arrange for this. I'll follow-up with her after this is completed.

## 2016-06-30 NOTE — Progress Notes (Signed)
Subjective:   Patient ID: [name], female    DOB: 07/09/1956    MRN: [id number]  Brief patient profile:  60 yo former former smoker, hx of HTN, GERD, allergies. Hx of abnormal CT scan of the chest, most recently on 01/02/14. Shows mild peribronchial thickening w some scattered peripheral interstitial changes, microvascular nodular changes, mild bronchiectasis, RML basilar scarring, stable RML nodule. Very little change compared with prior scan on 05/02/12 but more so than 2009. She has allergies with sensitivity to grasses, pollens.    Exposures > has lived in [place] and [place], she has owned dogs, cats, never birds, no mold exposures Has worked in a Education officer, community, 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 No history of auto-immune disease. Distant relative had SLE.  She was dx with PNA and bronchitis frequently as a youngster.   Spirometry 2012 at [facility] > AFL especially small airways. FOB was performed 01/23/14 > all cx data negative.   All auto-immune labs negative (ANCA, ANA, RF, ascl70, SSA/B).    She reports that since last time she underwent Nissen fundoplication since last time. She is now on Nexium twice a day. Her cough is much better. She does have allergies seasonally. Not currently on BD's.           Objective:   Physical Exam Wt Readings from Last 3 Encounters:  06/30/16 166 lb 12.8 oz (75.7 kg)  06/05/16 169 lb 12.8 oz (77 kg)  10/12/14 156 lb 12.8 oz (71.1 kg)   Vitals:   06/30/16 1154  BP: 120/70  BP Location: Left Arm  Cuff Size: Normal  Pulse: 86  SpO2: 94%  Weight: 166 lb 12.8 oz (75.7 kg)  Height: 5' 4.5" (1.638 m)     Gen: Pleasant, well-nourished, in no distress,  normal affect  ENT: No lesions,  mouth clear,  oropharynx clear, no postnasal drip  Neck: No JVD, no TMG, no carotid bruits  Lungs: No use of accessory muscles, clear without rales or rhonchi  Cardiovascular: RRR, heart sounds normal, no murmur or gallops, no peripheral  edema  Musculoskeletal: No deformities, no cyanosis or clubbing  Neuro: alert, non focal  Skin: Warm, no lesions or rashes    01/02/14 --  COMPARISON:  Chest CT 05/02/2012, chest radiograph 12/04/2013  FINDINGS: Gallbladder surgically absent.  Mild fatty infiltration of liver.  Remaining visualized portions of upper abdomen unremarkable.  Small hiatal hernia.  Mild scattered atherosclerotic calcification aorta.  No thoracic adenopathy.  Tiny lipoma of RIGHT latissimus dorsi 2.6 x 1.1 cm.  Emphysematous changes with minimal central peribronchial thickening.  Scattered interstitial changes are identified in the periphery of both lungs, greater in upper lobes, predominantly peribronchovascular and slightly nodular, increased since previous exam.  Minimal scattered bronchiectasis.  5 mm RIGHT middle lobe nodule image 37 unchanged.  Increased peripheral density in the anterolateral RIGHT middle lobe base, suspect scarring.  No segmental infiltrate, pleural effusion or pneumothorax.  Bones demineralized without acute osseous findings.  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  Stable RIGHT middle lobe nodule with increased RIGHT middle lobe scarring.  Underlying emphysematous and bronchitic changes.  Fatty infiltration of liver.  Small hiatal hernia.      Assessment & Plan:   Abnormal CT scan, chest Parabronchial nodularity and interstitial changes peripherally with some micronodular disease, possibly mycobacterial disease  although bronchoscopy was negative in 2015. I believe she needs a repeat CT scan of the chest and we will arrange for this. I'll follow-up with her after this is completed.   Acid reflux disease S/p Nissen and on Nexium  bid  Mild intermittent asthma She only appears to experience symptoms during allergy season or when she has an upper respiratory infection. We will continue albuterol as needed. Refill this for her today.   Baltazar Apo, MD, PhD 06/30/2016, 12:10 PM [facility] [phone] or if no answer [phone]

## 2016-07-06 ENCOUNTER — Ambulatory Visit (HOSPITAL_COMMUNITY)
Admission: RE | Admit: 2016-07-06 | Discharge: 2016-07-06 | Disposition: A | Discharge status: Home / Self Care | Payer: 59 | Source: Ambulatory Visit | Attending: Emergency Medicine | Admitting: Emergency Medicine

## 2016-07-06 DIAGNOSIS — K219 Gastro-esophageal reflux disease without esophagitis: Secondary | ICD-10-CM | POA: Insufficient documentation

## 2016-07-06 DIAGNOSIS — K224 Dyskinesia of esophagus: Secondary | ICD-10-CM | POA: Insufficient documentation

## 2016-07-06 DIAGNOSIS — Z9889 Other specified postprocedural states: Secondary | ICD-10-CM | POA: Diagnosis not present

## 2016-07-06 DIAGNOSIS — J439 Emphysema, unspecified: Secondary | ICD-10-CM | POA: Diagnosis not present

## 2016-07-06 DIAGNOSIS — R911 Solitary pulmonary nodule: Secondary | ICD-10-CM | POA: Diagnosis not present

## 2016-07-06 DIAGNOSIS — I700 Atherosclerosis of aorta: Secondary | ICD-10-CM | POA: Insufficient documentation

## 2016-07-06 DIAGNOSIS — I2510 Atherosclerotic heart disease of native coronary artery without angina pectoris: Secondary | ICD-10-CM | POA: Diagnosis not present

## 2016-07-06 DIAGNOSIS — K760 Fatty (change of) liver, not elsewhere classified: Secondary | ICD-10-CM | POA: Insufficient documentation

## 2016-07-24 ENCOUNTER — Ambulatory Visit (INDEPENDENT_AMBULATORY_CARE_PROVIDER_SITE_OTHER): Payer: 59 | Admitting: Emergency Medicine

## 2016-07-24 ENCOUNTER — Encounter: Payer: Self-pay | Admitting: Emergency Medicine

## 2016-07-24 DIAGNOSIS — R938 Abnormal findings on diagnostic imaging of other specified body structures: Secondary | ICD-10-CM

## 2016-07-24 DIAGNOSIS — R9389 Abnormal findings on diagnostic imaging of other specified body structures: Secondary | ICD-10-CM

## 2016-07-24 NOTE — Progress Notes (Signed)
Subjective:   Patient ID: [name], female    DOB: 06-28-1956    MRN: [id number]  Brief patient profile:  60 yo former former smoker, hx of HTN, GERD, allergies. Hx of abnormal CT scan of the chest, most recently on 01/02/14. Shows mild peribronchial thickening w some scattered peripheral interstitial changes, microvascular nodular changes, mild bronchiectasis, RML basilar scarring, stable RML nodule. Very little change compared with prior scan on 05/02/12 but more so than 2009. She has allergies with sensitivity to grasses, pollens.    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 No history of auto-immune disease. Distant relative had SLE.  She was dx with PNA and bronchitis frequently as a youngster.   Spirometry 2012 at [facility] > AFL especially small airways. FOB was performed 01/23/14 > all cx data negative.   All auto-immune labs negative (ANCA, ANA, RF, ascl70, SSA/B).    She reports that since last time she underwent Nissen fundoplication since last time. She is now on Nexium twice a day. Her cough is much better. She does have allergies seasonally. Not currently on BD's.   ROV 07/24/16 -- This follow-up visit for patient with mild intermittent asthma that is most bothersome during the allergy season. This has been better since she had a Nissen fundoplication and while on Nexium twice a day. She follows up today for her history of abnormal CT scan of the chest that shows parabronchial nodularity and interstitial changes peripherally. At her last visit we decided to repeat her CT scan and this was done on 07/06/16. I have reviewed. There is some mild cylindrical bronchiectasis in the right middle lobe and lingula that has not changed significantly. Also noted is some patchy tree-in-bud opacity bilaterally - this may be slightly increased compared with  her most recent scan 01/02/14.  She does have occasional cough, but it is not persistent. No fevers, no wt loss.           Objective:   Physical Exam Wt Readings from Last 3 Encounters:  07/24/16 166 lb (75.3 kg)  06/30/16 166 lb 12.8 oz (75.7 kg)  06/05/16 169 lb 12.8 oz (77 kg)   Vitals:   07/24/16 1205  BP: 118/80  BP Location: Right Arm  Cuff Size: Normal  Pulse: 90  SpO2: 96%  Weight: 166 lb (75.3 kg)  Height: 5' 4.5" (1.638 m)     Gen: Pleasant, well-nourished, in no distress,  normal affect  ENT: No lesions,  mouth clear,  oropharynx clear, no postnasal drip  Neck: No JVD, no TMG, no carotid bruits  Lungs: No use of accessory muscles, clear without rales or rhonchi  Cardiovascular: RRR, heart sounds normal, no murmur or gallops, no peripheral edema  Musculoskeletal: No deformities, no cyanosis or clubbing  Neuro: alert, non focal  Skin: Warm, no lesions or rashes   CT chest 07/06/16 --  COMPARISON:  01/02/2014 chest CT.  FINDINGS: Cardiovascular: Normal heart size. No significant pericardial fluid/thickening. Left anterior descending coronary atherosclerosis. Atherosclerotic nonaneurysmal thoracic aorta. Normal caliber pulmonary arteries.  Mediastinum/Nodes: No discrete thyroid nodules. Stable mild circumferential wall thickening in the lower thoracic esophagus with fluid level in the mid to lower thoracic esophagus. No pathologically enlarged axillary, mediastinal or gross hilar lymph nodes, noting limited sensitivity for the detection of hilar adenopathy on this noncontrast  study.  Lungs/Pleura: No pneumothorax. No pleural effusion. Mild-to-moderate centrilobular and paraseptal emphysema. No acute consolidative airspace disease or lung masses. There is mild cylindrical bronchiectasis throughout the right middle lobe and inferior segment of the lingula, not appreciably changed. There is patchy tree-in-bud opacity throughout both lungs, most  prominent in the right middle lobe, with lesser involvement of the bilateral upper lobes and bilateral lower lobes, mildly increased since 01/02/2014. There is scattered centrilobular nodularity at the areas of tree-in-bud opacity with a dominant 6 mm lateral segment right middle lobe solid pulmonary nodule (series 6/ image 87), stable since 01/02/2014, considered benign. Bandlike subpleural opacities with associated volume loss and distortion in the medial segment right middle lobe and inferior segment lingula, not appreciably changed, consistent with postinfectious/postinflammatory scarring. Otherwise no significant regions of subpleural reticulation, ground-glass attenuation, parenchymal banding, architectural distortion or frank honeycombing. No significant air trapping on the expiration sequence.  Upper abdomen: Interval postsurgical changes in the proximal stomach from Nissen fundoplication. Cannot exclude a recurrent small hiatal hernia. Mild diffuse hepatic steatosis. Cholecystectomy.  Musculoskeletal: No aggressive appearing focal osseous lesions. Mild thoracic spondylosis.  IMPRESSION: 1. 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 Findings have mildly progressed in the interval and are most prominent in the right middle lobe. 2. Scattered centrilobular nodularity at the areas of tree-in-bud opacity, with a dominant 6 mm lateral segment right middle lobe pulmonary nodule, unchanged since 01/02/2014, considered benign. 3. Mild-to-moderate emphysema. 4. Aortic atherosclerosis.  One vessel coronary atherosclerosis. 5. Minimal Nissen fundoplication. Cannot exclude a small recurrent hiatal hernia. Chronic mild circumferential wall thickening in the lower thoracic esophagus, nonspecific,  most commonly due to reflux esophagitis. Fluid level in the mid to lower thoracic esophagus indicates esophageal dysmotility and/or gastroesophageal reflux. 6. Mild diffuse hepatic steatosis.      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 Auto-immune w/u negative. Very little change on this CT. We will defer repeat FOB, follow clinically. ROV in 1 year to determine timing of any reimaging.    Baltazar Apo, MD, PhD 07/24/2016, 12:22 PM [facility] [phone] or if no answer [phone]

## 2016-07-24 NOTE — Assessment & Plan Note (Signed)
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 Auto-immune w/u negative. Very little change on this CT. We will defer repeat FOB, follow clinically. ROV in 1 year to determine timing of any reimaging.

## 2016-07-24 NOTE — Patient Instructions (Signed)
You CT scan of the chest shows very little change compared with 2015. There is still some subtle inflammation and bronchiectasis in the right middle lobe and lingula.  We will hold of on any other workup for now unless you develop progressive symptoms - fevers , wt loss, chronic cough, changes in you breathing.  We should follow annually, determine when you need re-imaging based on check-ups.

## 2016-07-31 ENCOUNTER — Ambulatory Visit (HOSPITAL_COMMUNITY): Payer: 59

## 2016-08-03 ENCOUNTER — Ambulatory Visit (HOSPITAL_COMMUNITY)
Admission: RE | Admit: 2016-08-03 | Discharge: 2016-08-03 | Disposition: A | Discharge status: Home / Self Care | Payer: 59 | Source: Ambulatory Visit | Attending: Physician Assistant | Admitting: Physician Assistant

## 2016-08-03 DIAGNOSIS — Z1231 Encounter for screening mammogram for malignant neoplasm of breast: Secondary | ICD-10-CM | POA: Diagnosis not present

## 2016-10-20 DIAGNOSIS — E7800 Pure hypercholesterolemia, unspecified: Secondary | ICD-10-CM | POA: Diagnosis not present

## 2016-10-20 DIAGNOSIS — R000 Tachycardia, unspecified: Secondary | ICD-10-CM | POA: Diagnosis not present

## 2016-10-20 DIAGNOSIS — R0789 Other chest pain: Secondary | ICD-10-CM | POA: Diagnosis not present

## 2016-12-15 DIAGNOSIS — E7800 Pure hypercholesterolemia, unspecified: Secondary | ICD-10-CM | POA: Diagnosis not present

## 2016-12-15 DIAGNOSIS — R0789 Other chest pain: Secondary | ICD-10-CM | POA: Diagnosis not present

## 2016-12-21 DIAGNOSIS — R000 Tachycardia, unspecified: Secondary | ICD-10-CM | POA: Diagnosis not present

## 2016-12-21 DIAGNOSIS — R0789 Other chest pain: Secondary | ICD-10-CM | POA: Diagnosis not present

## 2016-12-21 DIAGNOSIS — E7800 Pure hypercholesterolemia, unspecified: Secondary | ICD-10-CM | POA: Diagnosis not present

## 2017-01-21 DIAGNOSIS — R000 Tachycardia, unspecified: Secondary | ICD-10-CM | POA: Diagnosis not present

## 2017-01-21 DIAGNOSIS — E7800 Pure hypercholesterolemia, unspecified: Secondary | ICD-10-CM | POA: Diagnosis not present

## 2017-01-21 DIAGNOSIS — R0789 Other chest pain: Secondary | ICD-10-CM | POA: Diagnosis not present

## 2017-03-03 DIAGNOSIS — E7800 Pure hypercholesterolemia, unspecified: Secondary | ICD-10-CM | POA: Diagnosis not present

## 2017-03-03 DIAGNOSIS — N3281 Overactive bladder: Secondary | ICD-10-CM | POA: Insufficient documentation

## 2017-03-03 DIAGNOSIS — K449 Diaphragmatic hernia without obstruction or gangrene: Secondary | ICD-10-CM | POA: Insufficient documentation

## 2017-03-03 DIAGNOSIS — J439 Emphysema, unspecified: Secondary | ICD-10-CM | POA: Insufficient documentation

## 2017-03-03 DIAGNOSIS — I10 Essential (primary) hypertension: Secondary | ICD-10-CM | POA: Insufficient documentation

## 2017-03-03 DIAGNOSIS — E119 Type 2 diabetes mellitus without complications: Secondary | ICD-10-CM | POA: Diagnosis not present

## 2017-03-03 DIAGNOSIS — J302 Other seasonal allergic rhinitis: Secondary | ICD-10-CM | POA: Diagnosis not present

## 2017-03-18 DIAGNOSIS — R0789 Other chest pain: Secondary | ICD-10-CM | POA: Diagnosis not present

## 2017-03-18 DIAGNOSIS — E7800 Pure hypercholesterolemia, unspecified: Secondary | ICD-10-CM | POA: Diagnosis not present

## 2017-03-18 DIAGNOSIS — R000 Tachycardia, unspecified: Secondary | ICD-10-CM | POA: Diagnosis not present

## 2017-04-08 DIAGNOSIS — L820 Inflamed seborrheic keratosis: Secondary | ICD-10-CM | POA: Diagnosis not present

## 2017-04-08 DIAGNOSIS — D485 Neoplasm of uncertain behavior of skin: Secondary | ICD-10-CM | POA: Diagnosis not present

## 2017-04-08 DIAGNOSIS — D2362 Other benign neoplasm of skin of left upper limb, including shoulder: Secondary | ICD-10-CM | POA: Diagnosis not present

## 2017-04-08 DIAGNOSIS — L309 Dermatitis, unspecified: Secondary | ICD-10-CM | POA: Diagnosis not present

## 2017-07-21 DIAGNOSIS — R350 Frequency of micturition: Secondary | ICD-10-CM | POA: Diagnosis not present

## 2017-07-22 DIAGNOSIS — Z23 Encounter for immunization: Secondary | ICD-10-CM | POA: Diagnosis not present

## 2017-07-23 ENCOUNTER — Ambulatory Visit: Payer: 59 | Admitting: Emergency Medicine

## 2017-07-23 ENCOUNTER — Encounter: Payer: Self-pay | Admitting: Emergency Medicine

## 2017-07-23 DIAGNOSIS — J4520 Mild intermittent asthma, uncomplicated: Secondary | ICD-10-CM

## 2017-07-23 DIAGNOSIS — R911 Solitary pulmonary nodule: Secondary | ICD-10-CM | POA: Diagnosis not present

## 2017-07-23 MED ORDER — ALBUTEROL SULFATE HFA 108 (90 BASE) MCG/ACT IN AERS: 2.0000 | INHALATION_SPRAY | Freq: Four times a day (QID) | RESPIRATORY_TRACT | 5 refills | Status: DC | PRN

## 2017-07-23 NOTE — Assessment & Plan Note (Signed)
Minimal albuterol use, no exacerbations since last visit.  We will continue albuterol to use as needed.  No indication for scheduled ICS or bronchodilators at this time.

## 2017-07-23 NOTE — Patient Instructions (Addendum)
We will plan to repeat a CT scan of the chest, high-resolution cuts, no contrast in November 2019 Keep albuterol available to use 2 puffs if needed for shortness of breath, coughing, wheezing Use your over-the-counter allergy medications as needed during the fall and spring seasons Follow with Dr Lamonte Sakai in 1 year, or sooner if you develop any increased cough, shortness of breath, respiratory symptoms.

## 2017-07-23 NOTE — Progress Notes (Signed)
Subjective:   Patient ID: [name], female    DOB: 29-Jan-1956    MRN: [id number]  Brief patient profile:  61 yo former former smoker, hx of HTN, GERD, allergies. Hx of abnormal CT scan of the chest, most recently on 01/02/14. Shows mild peribronchial thickening w some scattered peripheral interstitial changes, microvascular nodular changes, mild bronchiectasis, RML basilar scarring, stable RML nodule. Very little change compared with prior scan on 05/02/12 but more so than 2009. She has allergies with sensitivity to grasses, pollens.    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 Distant relative had SLE.  She was dx with PNA and bronchitis frequently as a youngster.   Spirometry 2012 at [facility] > AFL especially small airways. FOB was performed 01/23/14 > all cx data negative.   All auto-immune labs negative (ANCA, ANA, RF, ascl70, SSA/B).    She reports that since last time she underwent Nissen fundoplication since last time. She is now on Nexium twice a day. Her cough is much better. She does have allergies seasonally. Not currently on BD's.   ROV 07/24/16 -- This follow-up visit for patient with mild intermittent asthma that is most bothersome during the allergy season. This has been better since she had a Nissen fundoplication and while on Nexium twice a day. She follows up today for her history of abnormal CT scan of the chest that shows parabronchial nodularity and interstitial changes peripherally. At her last visit we decided to repeat her CT scan and this was done on 07/06/16. I have reviewed. There is some mild cylindrical bronchiectasis in the right middle lobe and lingula that has not changed significantly. Also noted is some patchy tree-in-bud opacity bilaterally - this may be slightly increased compared with  her most recent scan 01/02/14.  She does have occasional cough, but it is not persistent. No fevers, no wt loss.   ROV 07/23/17 --this is a follow-up visit for history of peribronchial nodularity, bronchiectasis and interstitial changes by CT scan of the chest.  She also has a history of mild intermittent asthma, esophageal reflux.  Her last CT scan was approximately a year ago 07/06/16.  Based on her prior discussions we decided not to pursue BAL in absence of significant symptoms.  She reports today that she has some occasional dysphagia. Remains on nexium bid. Minimal cough, using some OTC allergy meds. Minimal mucous production.  She has not used albuterol with any frequency.  Denies any wheezing, good exercise tolerance          Objective:   Physical Exam Wt Readings from Last 3 Encounters:  07/23/17 173 lb (78.5 kg)  07/24/16 166 lb (75.3 kg)  06/30/16 166 lb 12.8 oz (75.7 kg)   Vitals:   07/23/17 0926  BP: 120/76  Pulse: 91  SpO2: 94%  Weight: 173 lb (78.5 kg)  Height: 5' 4.5" (1.638 m)     Gen: Pleasant, well-nourished, in no distress,  normal affect  ENT: No lesions,  mouth clear,  oropharynx clear, no postnasal drip  Neck: No JVD, no stridor  Lungs: No use of accessory muscles, clear without rales or rhonchi  Cardiovascular: RRR, heart sounds normal, no murmur or gallops, no peripheral edema  Musculoskeletal: No deformities, no cyanosis or clubbing  Neuro: alert, non focal  Skin: Warm, no lesions or rashes   CT chest 07/06/16 --  COMPARISON:  01/02/2014 chest CT.  FINDINGS: Cardiovascular: Normal heart size. No significant pericardial fluid/thickening. Left anterior descending coronary atherosclerosis. Atherosclerotic nonaneurysmal thoracic aorta. Normal caliber pulmonary arteries.  Mediastinum/Nodes: No discrete thyroid nodules. Stable mild circumferential wall thickening in the lower thoracic esophagus with fluid level in the mid to lower thoracic  esophagus. No pathologically enlarged axillary, mediastinal or gross hilar lymph nodes, noting limited sensitivity for the detection of hilar adenopathy on this noncontrast study.  Lungs/Pleura: No pneumothorax. No pleural effusion. Mild-to-moderate centrilobular and paraseptal emphysema. No acute consolidative airspace disease or lung masses. There is mild cylindrical bronchiectasis throughout the right middle lobe and inferior segment of the lingula, not appreciably changed. There is patchy tree-in-bud opacity throughout both lungs, most prominent in the right middle lobe, with lesser involvement of the bilateral upper lobes and bilateral lower lobes, mildly increased since 01/02/2014. There is scattered centrilobular nodularity at the areas of tree-in-bud opacity with a dominant 6 mm lateral segment right middle lobe solid pulmonary nodule (series 6/ image 87), stable since 01/02/2014, considered benign. Bandlike subpleural opacities with associated volume loss and distortion in the medial segment right middle lobe and inferior segment lingula, not appreciably changed, consistent with postinfectious/postinflammatory scarring. Otherwise no significant regions of subpleural reticulation, ground-glass attenuation, parenchymal banding, architectural distortion or frank honeycombing. No significant air trapping on the expiration sequence.  Upper abdomen: Interval postsurgical changes in the proximal stomach from Nissen fundoplication. Cannot exclude a recurrent small hiatal hernia. Mild diffuse hepatic steatosis. Cholecystectomy.  Musculoskeletal: No aggressive appearing focal osseous lesions. Mild thoracic spondylosis.  IMPRESSION: 1. 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 Findings have  mildly progressed in the interval and are most prominent in the right middle lobe. 2. Scattered centrilobular nodularity at the areas of tree-in-bud opacity, with a dominant 6 mm lateral segment right middle lobe pulmonary nodule, unchanged since 01/02/2014, considered benign. 3. Mild-to-moderate emphysema. 4. Aortic atherosclerosis.  One vessel coronary atherosclerosis. 5. Minimal Nissen fundoplication. Cannot exclude a small recurrent hiatal hernia. Chronic mild circumferential wall thickening in the lower thoracic esophagus, nonspecific, most commonly due to reflux esophagitis. Fluid level in the mid to lower thoracic esophagus indicates esophageal dysmotility and/or gastroesophageal reflux. 6. Mild diffuse hepatic steatosis.      Assessment & Plan:   Mild intermittent asthma Minimal albuterol use, no exacerbations since last visit.  We will continue albuterol to use as needed.  No indication for scheduled ICS or bronchodilators at this time.  Abnormal CT scan, chest Mild micronodular disease with associated interstitial prominence and bronchiectasis by CT scan of the chest, last was 07/06/16.  Based on her clinical stability in our discussions today we will plan to defer bronchoscopy/BAL.  We will repeat her CT scan of the chest in November 2019 to look for interval change.  If she has a clinical change or if her CT shows progression then we will pursue diagnostic bronchoscopy for cultures.  If she worsens before next November then we will perform the CT scan and potentially bronchoscopy sooner.   Baltazar Apo, MD, PhD 07/23/2017, 9:39 AM [facility] [phone] or if no answer [phone]

## 2017-07-23 NOTE — Assessment & Plan Note (Signed)
Mild micronodular disease with associated interstitial prominence and bronchiectasis by CT scan of the chest, last was 07/06/16.  Based on her clinical stability in our discussions today we will plan to defer bronchoscopy/BAL.  We will repeat her CT scan of the chest in November 2019 to look for interval change.  If she has a clinical change or if her CT shows progression then we will pursue diagnostic bronchoscopy for cultures.  If she worsens before next November then we will perform the CT scan and potentially bronchoscopy sooner.

## 2017-07-29 ENCOUNTER — Other Ambulatory Visit (HOSPITAL_COMMUNITY): Payer: Self-pay | Admitting: Internal Medicine

## 2017-07-29 DIAGNOSIS — Z1231 Encounter for screening mammogram for malignant neoplasm of breast: Secondary | ICD-10-CM

## 2017-08-09 ENCOUNTER — Ambulatory Visit (HOSPITAL_COMMUNITY)
Admission: RE | Admit: 2017-08-09 | Discharge: 2017-08-09 | Disposition: A | Discharge status: Home / Self Care | Payer: 59 | Source: Ambulatory Visit | Attending: Internal Medicine | Admitting: Internal Medicine

## 2017-08-09 ENCOUNTER — Encounter (HOSPITAL_COMMUNITY): Payer: Self-pay

## 2017-08-09 DIAGNOSIS — Z1231 Encounter for screening mammogram for malignant neoplasm of breast: Secondary | ICD-10-CM | POA: Diagnosis not present

## 2017-08-30 DIAGNOSIS — E7800 Pure hypercholesterolemia, unspecified: Secondary | ICD-10-CM | POA: Diagnosis not present

## 2017-08-30 DIAGNOSIS — E119 Type 2 diabetes mellitus without complications: Secondary | ICD-10-CM | POA: Diagnosis not present

## 2017-08-30 DIAGNOSIS — J302 Other seasonal allergic rhinitis: Secondary | ICD-10-CM | POA: Diagnosis not present

## 2017-09-02 DIAGNOSIS — R52 Pain, unspecified: Secondary | ICD-10-CM | POA: Diagnosis not present

## 2017-09-02 DIAGNOSIS — L910 Hypertrophic scar: Secondary | ICD-10-CM | POA: Diagnosis not present

## 2017-09-02 DIAGNOSIS — D225 Melanocytic nevi of trunk: Secondary | ICD-10-CM | POA: Diagnosis not present

## 2017-09-16 DIAGNOSIS — I10 Essential (primary) hypertension: Secondary | ICD-10-CM | POA: Diagnosis not present

## 2017-09-16 DIAGNOSIS — R000 Tachycardia, unspecified: Secondary | ICD-10-CM | POA: Diagnosis not present

## 2017-09-16 DIAGNOSIS — R0789 Other chest pain: Secondary | ICD-10-CM | POA: Diagnosis not present

## 2018-01-18 ENCOUNTER — Ambulatory Visit (INDEPENDENT_AMBULATORY_CARE_PROVIDER_SITE_OTHER): Payer: 59

## 2018-01-18 ENCOUNTER — Encounter: Payer: Self-pay | Admitting: Podiatry

## 2018-01-18 ENCOUNTER — Ambulatory Visit: Payer: 59 | Admitting: Podiatry

## 2018-01-18 ENCOUNTER — Other Ambulatory Visit: Payer: Self-pay | Admitting: Podiatry

## 2018-01-18 DIAGNOSIS — M779 Enthesopathy, unspecified: Secondary | ICD-10-CM

## 2018-01-18 DIAGNOSIS — L600 Ingrowing nail: Secondary | ICD-10-CM | POA: Diagnosis not present

## 2018-01-18 DIAGNOSIS — M2011 Hallux valgus (acquired), right foot: Secondary | ICD-10-CM

## 2018-01-18 DIAGNOSIS — M778 Other enthesopathies, not elsewhere classified: Secondary | ICD-10-CM

## 2018-01-18 DIAGNOSIS — M21622 Bunionette of left foot: Principal | ICD-10-CM

## 2018-01-18 DIAGNOSIS — Q828 Other specified congenital malformations of skin: Secondary | ICD-10-CM | POA: Diagnosis not present

## 2018-01-18 DIAGNOSIS — M21621 Bunionette of right foot: Secondary | ICD-10-CM

## 2018-01-18 MED ORDER — NEOMYCIN-POLYMYXIN-HC 1 % OT SOLN: OTIC | 1 refills | Status: DC

## 2018-01-18 NOTE — Patient Instructions (Signed)

## 2018-01-18 NOTE — Progress Notes (Signed)
Subjective:  Patient ID: [name], female    DOB: 1955/11/04,  MRN: [id number] HPI Chief Complaint  Patient presents with  . Foot Pain    1st MPJ right and forefoot left   "I have this bunion that's hurting and there is a spot at the toes on the left that hurts too"  . Callouses    Trim calluses    62 y.o. female presents with the above complaint.   ROS: Denies fever chills nausea vomiting muscle aches pains calf pain back pain chest pain shortness of breath.  Past Medical History:  Diagnosis Date  . Allergy   . GERD (gastroesophageal reflux disease)   . Heartburn   . High blood pressure    Past Surgical History:  Procedure Laterality Date  . ABDOMINAL HYSTERECTOMY    . CHOLECYSTECTOMY    . COLONOSCOPY  2010   Dr. Aviva Signs: four polyps in rectum and sigmoid colon (hyperplastic polyps)  . ESOPHAGOGASTRODUODENOSCOPY N/A 06/08/2014   SLF: 1. No obvious source for dyspepsia identified. 2. Medium sized hiatal hernia 3. Mild non-erosive gastritis.  Marland Kitchen ESOPHAGOGASTRODUODENOSCOPY  2010   Dr. Aviva Signs: hiatal hernia  . EUS  05/21/10   outlaw:hyperechoic liver mild consistent with steatosis otherwise normal [place]  . GALLBLADDER SURGERY  2004  . LAPAROSCOPIC NISSEN FUNDOPLICATION N/A [id number]/[id number]   Procedure: LAPAROSCOPIC NISSEN FUNDOPLICATION;  Surgeon: Jamesetta So, MD;  Location: [facility];  Service: General;  Laterality: N/A;  . TONSILLECTOMY    . VIDEO BRONCHOSCOPY Bilateral 01/23/2014   Procedure: VIDEO BRONCHOSCOPY WITHOUT FLUORO;  Surgeon: Collene Gobble, MD;  Location: [facility];  Service: Cardiopulmonary;  Laterality: Bilateral;    Current Outpatient Medications:  .  albuterol (PROVENTIL HFA;VENTOLIN HFA) 108 (90 Base) MCG/ACT inhaler, Inhale 2 puffs into the lungs every 6 (six) hours as needed for wheezing or shortness of breath., Disp: 1 Inhaler, Rfl: 5 .  aspirin EC 81 MG tablet, Take 81 mg by mouth daily., Disp: , Rfl:  .  Biotin 5000 MCG TABS, Take 1 tablet by  mouth daily., Disp: , Rfl:  .  cyclobenzaprine (FLEXERIL) 10 MG tablet, take 1 tablet twice a day (Patient taking differently: take 1 tablet daily), Disp: 60 tablet, Rfl: 1 .  esomeprazole (NEXIUM) 40 MG capsule, Take 40 mg by mouth daily at 12 noon. , Disp: , Rfl:  .  irbesartan (AVAPRO) 150 MG tablet, TK 1 T PO QD, Disp: , Rfl: 0 .  Melatonin 5 MG TABS, Take 1 tablet by mouth at bedtime., Disp: , Rfl:  .  NEOMYCIN-POLYMYXIN-HYDROCORTISONE (CORTISPORIN) 1 % SOLN OTIC solution, Apply 1-2 drops to toe BID after soaking, Disp: 10 mL, Rfl: 1 .  rosuvastatin (CRESTOR) 10 MG tablet, Take 10 mg by mouth daily., Disp: , Rfl:  .  Trospium Chloride 60 MG CP24, Take 1 capsule by mouth daily., Disp: , Rfl:  .  verapamil (VERELAN PM) 240 MG 24 hr capsule, Take 240 mg by mouth at bedtime., Disp: , Rfl:   Allergies  Allergen Reactions  . Dexilant [Dexlansoprazole]     N/V/D  . Percocet [Oxycodone-Acetaminophen]     Vomit and extreme headache  . Latex Rash   Review of Systems Objective:  There were no vitals filed for this visit.  General: Well developed, nourished, in no acute distress, alert and oriented x3   Dermatological: Skin is warm, dry and supple bilateral. Nails x 10 are well maintained; remaining integument appears unremarkable at this time. There are no  open sores, no preulcerative lesions, no rash or signs of infection present.  Vascular: Dorsalis Pedis artery and Posterior Tibial artery pedal pulses are 2/4 bilateral with immedate capillary fill time. Pedal hair growth present. No varicosities and no lower extremity edema present bilateral.   Neruologic: Grossly intact via light touch bilateral. Vibratory intact via tuning fork bilateral. Protective threshold with Semmes Wienstein monofilament intact to all pedal sites bilateral. Patellar and Achilles deep tendon reflexes 2+ bilateral. No Babinski or clonus noted bilateral.   Musculoskeletal: No gross boney pedal deformities bilateral.  No pain, crepitus, or limitation noted with foot and ankle range of motion bilateral. Muscular strength 5/5 in all groups tested bilateral.  Gait: Unassisted, Nonantalgic.    Radiographs:  Radiographs taken today do not demonstrate any acute findings.  Do demonstrate mild hallux valgus deformities right greater than left.  Assessment & Plan:   Assessment: Hallux valgus deformity with bursitis right foot.  Ingrown toenail hallux left tibial and fibular border.  Plan: Discussed etiology pathology conservative surgical therapies.  After 3 cc of a 50-50 mixture of Marcaine plain and lidocaine plain was infiltrated in a hallux block left a chemical matrixectomy was performed.  Tolerated procedure well was given both oral and written home-going instructions as well as a prescription for Cortisporin Otic to be applied twice daily after soaking.  Right foot we did perform a dexamethasone injection under sterile Betadine skin prep consisting of 2 mg of dexamethasone and local anesthetic to the dorsal medial aspect of the first metatarsophalangeal joint of the right foot.  Tolerated procedure well without complications.  I will follow-up with her in the near future for surgical intervention if necessary.  Otherwise she was provided with both oral home-going instructions after soaking and I will follow-up with her in 2 weeks     Kashish Yglesias T. [place], [place]

## 2018-01-26 ENCOUNTER — Telehealth: Payer: Self-pay | Admitting: Podiatry

## 2018-01-26 NOTE — Telephone Encounter (Signed)
I saw Dr. Milinda Pointer last Tuesday and he removed both edges of my left great toenail. I've been doing the soaking in the betadine solution. I had to stop doing that Sunday because my foot was all itchy red, bumps. I went to epsom salts Sunday and it is now Wednesday and those bumps are still there. The itch drives me freaking bonkers especially at night. I've been using cortisone cream and it stopped working last night. The base of the nail looks like it may be doing the same thing there, I'm not sure but it is not sore, it just itches. I don't know if I should switch to the vinegar and water and if so, how much vinegar in water? Or is there something else I should be doing? Home number is [phone] and I won't be home until around 12 noon as I have another doctor's appointment. For the interim my cell number is [phone]. Thank you. Bye.

## 2018-01-26 NOTE — Telephone Encounter (Signed)
This is [name]. I talked to [facility] this morning and she put me through to make an appointment. My appointment is not until tomorrow morning at 11 am. I still have the question of am I still supposed to soak it tonight and tomorrow in epsom salt or change over to vinegar and water? If I have to switch to vinegar and water how much vinegar am I to put in the water? I guess we can go from there. Okay, thank you. Bye.

## 2018-01-26 NOTE — Telephone Encounter (Signed)
Left message for pt not to soak, but to clean the area with warm soapy water, rinse well and cover with antibiotic ointment bandaid.

## 2018-01-26 NOTE — Telephone Encounter (Signed)
I explained to pt the betadine and epsom salt were often very drying and could cause a rash, betadine especially if more than a tablespoon was used. Pt states she actually has blisters to top of the foot and one area has popped. I told pt let's not change to a different solution, and I would like to get her in to be seen. Pt agreed and I transferred to schedulers.

## 2018-01-27 ENCOUNTER — Ambulatory Visit: Payer: 59 | Admitting: Podiatry

## 2018-01-27 ENCOUNTER — Encounter: Payer: Self-pay | Admitting: Podiatry

## 2018-01-27 DIAGNOSIS — L03032 Cellulitis of left toe: Secondary | ICD-10-CM | POA: Diagnosis not present

## 2018-01-27 MED ORDER — DOXYCYCLINE HYCLATE 100 MG PO TABS: 100.0000 mg | ORAL_TABLET | Freq: Two times a day (BID) | ORAL | 0 refills | Status: DC

## 2018-01-27 NOTE — Addendum Note (Signed)
Addended by: Tyson Dense T on: 01/27/2018 12:26 PM   Modules accepted: Level of Service

## 2018-01-27 NOTE — Progress Notes (Signed)
She presents today for follow-up of the nail procedure there is performed last week or so and states that a little red but is not bothering me now she states that it turned red and blistery and sore.  Objective: Vital signs are stable she is alert and oriented x3 there is no erythema edema cellulitis drainage or odor.  Assessment: Mild paronychia status post matrixectomy's.  Plan: Doxycycline x10 days and I will follow-up with her in 1 week.

## 2018-02-03 ENCOUNTER — Ambulatory Visit: Payer: 59 | Admitting: Podiatry

## 2018-02-21 DIAGNOSIS — R82998 Other abnormal findings in urine: Secondary | ICD-10-CM | POA: Diagnosis not present

## 2018-02-21 DIAGNOSIS — Z0000 Encounter for general adult medical examination without abnormal findings: Secondary | ICD-10-CM | POA: Insufficient documentation

## 2018-03-04 DIAGNOSIS — Z0000 Encounter for general adult medical examination without abnormal findings: Secondary | ICD-10-CM | POA: Diagnosis not present

## 2018-03-04 DIAGNOSIS — Z1212 Encounter for screening for malignant neoplasm of rectum: Secondary | ICD-10-CM | POA: Diagnosis not present

## 2018-03-04 DIAGNOSIS — J302 Other seasonal allergic rhinitis: Secondary | ICD-10-CM | POA: Diagnosis not present

## 2018-03-04 DIAGNOSIS — Z1389 Encounter for screening for other disorder: Secondary | ICD-10-CM | POA: Diagnosis not present

## 2018-03-04 DIAGNOSIS — E7800 Pure hypercholesterolemia, unspecified: Secondary | ICD-10-CM | POA: Diagnosis not present

## 2018-03-04 DIAGNOSIS — E119 Type 2 diabetes mellitus without complications: Secondary | ICD-10-CM | POA: Diagnosis not present

## 2018-04-05 ENCOUNTER — Encounter: Payer: Self-pay | Admitting: Podiatry

## 2018-04-05 ENCOUNTER — Ambulatory Visit: Payer: 59 | Admitting: Podiatry

## 2018-04-05 DIAGNOSIS — M2011 Hallux valgus (acquired), right foot: Secondary | ICD-10-CM | POA: Diagnosis not present

## 2018-04-05 NOTE — Patient Instructions (Signed)
Pre-Operative Instructions  Congratulations, you have decided to take an important step towards improving your quality of life.  You can be assured that the doctors and staff at [facility] will be with you every step of the way.  Here are some important things you should know:  1. Plan to be at the surgery center/hospital at least 1 (one) hour prior to your scheduled time, unless otherwise directed by the surgical center/hospital staff.  You must have a responsible adult accompany you, remain during the surgery and drive you home.  Make sure you have directions to the surgical center/hospital to ensure you arrive on time. 2. If you are having surgery at [facility] or [facility], you will need a copy of your medical history and physical form from your family physician within one month prior to the date of surgery. We will give you a form for your primary physician to complete.  3. We make every effort to accommodate the date you request for surgery.  However, there are times where surgery dates or times have to be moved.  We will contact you as soon as possible if a change in schedule is required.   4. No aspirin/ibuprofen for one week before surgery.  If you are on aspirin, any non-steroidal anti-inflammatory medications (Mobic, Aleve, Ibuprofen) should not be taken seven (7) days prior to your surgery.  You make take Tylenol for pain prior to surgery.  5. Medications - If you are taking daily heart and blood pressure medications, seizure, reflux, allergy, asthma, anxiety, pain or diabetes medications, make sure you notify the surgery center/hospital before the day of surgery so they can tell you which medications you should take or avoid the day of surgery. 6. No food or drink after midnight the night before surgery unless directed otherwise by surgical center/hospital staff. 7. No alcoholic beverages 24-hours prior to surgery.  No smoking 24-hours prior or 24-hours after  surgery. 8. Wear loose pants or shorts. They should be loose enough to fit over bandages, boots, and casts. 9. Don't wear slip-on shoes. Sneakers are preferred. 10. Bring your boot with you to the surgery center/hospital.  Also bring crutches or a walker if your physician has prescribed it for you.  If you do not have this equipment, it will be provided for you after surgery. 11. If you have not been contacted by the surgery center/hospital by the day before your surgery, call to confirm the date and time of your surgery. 12. Leave-time from work may vary depending on the type of surgery you have.  Appropriate arrangements should be made prior to surgery with your employer. 13. Prescriptions will be provided immediately following surgery by your doctor.  Fill these as soon as possible after surgery and take the medication as directed. Pain medications will not be refilled on weekends and must be approved by the doctor. 14. Remove nail polish on the operative foot and avoid getting pedicures prior to surgery. 15. Wash the night before surgery.  The night before surgery wash the foot and leg well with water and the antibacterial soap provided. Be sure to pay special attention to beneath the toenails and in between the toes.  Wash for at least three (3) minutes. Rinse thoroughly with water and dry well with a towel.  Perform this wash unless told not to do so by your physician.  Enclosed: 1 Ice pack (please put in freezer the night before surgery)   1 Hibiclens skin cleaner     Pre-op instructions  If you have any questions regarding the instructions, please do not hesitate to call our office.  [place]: [address], [place], [place] [address] -- [phone]  [place]: [address] [place], [place] [address] -- [phone]  [place]: [address]  [place], [place] [address] -- [phone]  [place]: [address], [address], [place], [place] [address] -- [phone]  Website:  [email or web address] 
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
## 2018-04-22 ENCOUNTER — Telehealth: Payer: Self-pay | Admitting: *Deleted

## 2018-04-22 NOTE — Telephone Encounter (Signed)
"  I'm supposed to be scheduled for surgery on my foot at [facility] next Friday.  There's discussion between them and [place] about whether or not I'm supposed to take the baby aspirin.  Am I supposed to stop it today, tomorrow, Sunday, or the day of surgery?  So if someone could let me know, that would be great."

## 2018-04-26 NOTE — Telephone Encounter (Signed)
I called and left the patient a message to stop taking the baby aspirin today.  Start taking it again after the surgery.

## 2018-04-28 ENCOUNTER — Other Ambulatory Visit: Payer: Self-pay | Admitting: Podiatry

## 2018-04-28 MED ORDER — CEPHALEXIN 500 MG PO CAPS: 500.0000 mg | ORAL_CAPSULE | Freq: Three times a day (TID) | ORAL | 0 refills | Status: DC

## 2018-04-28 MED ORDER — ONDANSETRON HCL 4 MG PO TABS: 4.0000 mg | ORAL_TABLET | Freq: Three times a day (TID) | ORAL | 0 refills | Status: DC | PRN

## 2018-04-28 MED ORDER — HYDROMORPHONE HCL 4 MG PO TABS: 4.0000 mg | ORAL_TABLET | ORAL | 0 refills | Status: DC | PRN

## 2018-04-29 DIAGNOSIS — M25571 Pain in right ankle and joints of right foot: Secondary | ICD-10-CM | POA: Diagnosis not present

## 2018-04-29 DIAGNOSIS — M2011 Hallux valgus (acquired), right foot: Secondary | ICD-10-CM | POA: Diagnosis not present

## 2018-05-02 ENCOUNTER — Encounter: Payer: Self-pay | Admitting: Podiatry

## 2018-05-02 ENCOUNTER — Telehealth: Payer: Self-pay | Admitting: *Deleted

## 2018-05-02 NOTE — Telephone Encounter (Signed)
"  I'm calling to confirm that [name] had surgery on Friday, 04/29/2018."  Yes, as far as I know, she had surgery on 04/29/2018.  "Does she still have a follow-up appointment scheduled for this week?"  Yes, she has an appointment scheduled for 05/05/2018.  "Okay, that's all I needed to know."

## 2018-05-04 ENCOUNTER — Telehealth: Payer: Self-pay | Admitting: Podiatry

## 2018-05-04 MED ORDER — HYDROMORPHONE HCL 4 MG PO TABS: 4.0000 mg | ORAL_TABLET | Freq: Four times a day (QID) | ORAL | 0 refills | Status: AC | PRN

## 2018-05-04 NOTE — Addendum Note (Signed)
Addended by: Tyson Dense T on: 05/04/2018 04:58 PM   Modules accepted: Orders

## 2018-05-04 NOTE — Telephone Encounter (Addendum)
I told pt I could fill the prescription for the dilaudid, but she would have to have someone pick up the hard copy in the [place] office. Pt states she won't be able to do that. I asked if she would like to pick it up at her appt time. Dr. Amalia Hailey is in office and can escribe the dilaudid. Routed request to Dr. Amalia Hailey and place notes on his dictation desk to order.

## 2018-05-04 NOTE — Telephone Encounter (Signed)
I'm calling to see if I can get a few pain pills until I come in for my first post op appointment tomorrow with Dr. Milinda Pointer. I can be reached at [phone]. Thank you.

## 2018-05-04 NOTE — Telephone Encounter (Signed)
I informed pt one of our doctors that could escribe the dilaudid would send in the rx to [facility].

## 2018-05-04 NOTE — Telephone Encounter (Signed)
Dr. Milinda Pointer took care of escribing her refill for hydromorphone

## 2018-05-05 ENCOUNTER — Ambulatory Visit (INDEPENDENT_AMBULATORY_CARE_PROVIDER_SITE_OTHER): Payer: 59

## 2018-05-05 ENCOUNTER — Encounter: Payer: Self-pay | Admitting: Podiatry

## 2018-05-05 ENCOUNTER — Ambulatory Visit (INDEPENDENT_AMBULATORY_CARE_PROVIDER_SITE_OTHER): Payer: Self-pay | Admitting: Podiatry

## 2018-05-05 VITALS — BP 121/73 | HR 75 | Temp 98.3°F

## 2018-05-05 DIAGNOSIS — M2011 Hallux valgus (acquired), right foot: Secondary | ICD-10-CM

## 2018-05-05 MED ORDER — HYDROCODONE-ACETAMINOPHEN 5-325 MG PO TABS: 1.0000 | ORAL_TABLET | Freq: Four times a day (QID) | ORAL | 0 refills | Status: DC | PRN

## 2018-05-05 NOTE — Progress Notes (Signed)
She presents today for follow-up of her surgical foot.  Her first postop visit date of surgery was 04/29/2018.  She states there is been throbbing but I think is the boot and/or the dressing.  She denies fever chills nausea vomitus likes pains.  Objective: Vital signs are stable alert and oriented x3.  Pulses are palpable.  Dry sterile dressing was intact was removed demonstrates rectus first metatarsophalangeal joint.  Radio graphs demonstrate a well-positioned first metatarsal phalangeal joint of the right foot.  Internal fixation is in good position.  Assessment: Well-healing surgical foot.  Plan: Redressed the foot today dressed a compressive dressing dispensed another cam walker since that one was effective we also refilled her medication but gave her Vicodin instead of Dilaudid.  Follow-up with her in 1 week

## 2018-05-12 ENCOUNTER — Ambulatory Visit (INDEPENDENT_AMBULATORY_CARE_PROVIDER_SITE_OTHER): Payer: 59 | Admitting: Podiatry

## 2018-05-12 DIAGNOSIS — M2011 Hallux valgus (acquired), right foot: Secondary | ICD-10-CM

## 2018-05-12 NOTE — Progress Notes (Signed)
She presents today for follow-up of her bunion repair.  Is been about 2 weeks since the date of surgery.  She states that the boot touches the bunion site and it hurts.  Otherwise is doing really well not doing my exercises.  She denies fever chills nausea vomiting muscle aches and pains.  Objective: Vital signs are stable she is alert and oriented x3 mild edema no erythema cellulitis drainage or odor great range of motion first metatarsophalangeal joint.  Assessment: Well-healing surgical foot.  Plan: Recommended compression anklet and a Darco shoe we will follow-up with her in 2 weeks.  I am allow her to start getting this wet.

## 2018-05-26 ENCOUNTER — Encounter: Payer: Self-pay | Admitting: Podiatry

## 2018-05-26 ENCOUNTER — Ambulatory Visit (INDEPENDENT_AMBULATORY_CARE_PROVIDER_SITE_OTHER): Payer: 59 | Admitting: Podiatry

## 2018-05-26 ENCOUNTER — Ambulatory Visit (INDEPENDENT_AMBULATORY_CARE_PROVIDER_SITE_OTHER): Payer: 59

## 2018-05-26 DIAGNOSIS — M2011 Hallux valgus (acquired), right foot: Secondary | ICD-10-CM

## 2018-05-26 DIAGNOSIS — Z9889 Other specified postprocedural states: Secondary | ICD-10-CM

## 2018-05-26 NOTE — Progress Notes (Signed)
She presents today status post [name] bunionectomy right states that seems to be doing pretty well.  Date of surgery 04/29/2018.  States that the soreness is resolving around the medial aspect of the foot and the swelling seems to be going down.  She states that she has to wear salon poss pain relieving pads when she places a compression anklet on her foot but causes too much pressure for.  Objective: Vital signs are stable she is alert and oriented x3.  She has great range of motion of the first metatarsophalangeal joint.  Minimal edema there is no erythema no cellulitis drainage or odor.  Radiographs demonstrate capital osteotomy is in good position with screw fixation in good position.  Assessment: Well-healing surgical foot right.  Plan: At this point I would allow her to try to get back into a tennis shoe should this bother her she will revert back to the Darco shoe until she can remain in a tennis shoe at all times.  I expressed to her no bare feet no high impact activities I will follow-up with her in about 3 to 4 weeks for another set of x-rays.

## 2018-05-27 ENCOUNTER — Telehealth: Payer: Self-pay | Admitting: Podiatry

## 2018-05-27 NOTE — Telephone Encounter (Signed)
I saw Dr. Milinda Pointer for a postop appointment yesterday and again he brought up again that I need to do a hot/cold shock something soaks. I never did ask him how long I should leave the foot in the hot and the cold? Also how many times a day should I do it? I would like some clarification on that before the weekend rolls around. I also need clarification on driving. Thank you. Bye.

## 2018-05-30 NOTE — Telephone Encounter (Signed)
I called pt and informed her I had sent a message to his primary assistant on Friday for instructions on post-op soaks, and I did not see orders in Dr. Stephenie Acres clinicals for soaking the surgery foot, so I could not advise on the soaks. Pt states she had been instructed to soak the surgery foot at each of the last two office visits. I told pt I could not advise her to soak, the clinicals of 05/12/2018 did state she could get the surgery foot wet, which meant shower wet, no standing water.

## 2018-05-31 NOTE — Telephone Encounter (Signed)
I told pt once again I did not see orders in the clinicals for soaks, and I had asked his assistant and my supervisor and they did not know of Dr. Stephenie Acres post op hot to cold soaks, but I would inform him tomorrow of her request.

## 2018-05-31 NOTE — Telephone Encounter (Signed)
Pt called stating she had not received information on the soaks and would like a call back.

## 2018-06-01 NOTE — Telephone Encounter (Signed)
This is a contrast bath.  Warm/hot water for 30 sec and Ice water for 30, for 5 minutes twice daily. Continue till pain is decreased.

## 2018-06-01 NOTE — Telephone Encounter (Signed)
I informed pt of Dr. Stephenie Acres instructions for a contrast bath. Pt states understanding.

## 2018-06-01 NOTE — Progress Notes (Signed)
DOS: 04-29-2018 [place]  with internal screw fixation RT  Dr. Tobe Sos

## 2018-06-07 ENCOUNTER — Other Ambulatory Visit: Payer: Self-pay | Admitting: Emergency Medicine

## 2018-06-07 DIAGNOSIS — J849 Interstitial pulmonary disease, unspecified: Secondary | ICD-10-CM

## 2018-06-23 ENCOUNTER — Encounter: Payer: Self-pay | Admitting: Podiatry

## 2018-06-23 ENCOUNTER — Ambulatory Visit (INDEPENDENT_AMBULATORY_CARE_PROVIDER_SITE_OTHER): Payer: 59 | Admitting: Podiatry

## 2018-06-23 ENCOUNTER — Ambulatory Visit (INDEPENDENT_AMBULATORY_CARE_PROVIDER_SITE_OTHER): Payer: 59

## 2018-06-23 DIAGNOSIS — M2011 Hallux valgus (acquired), right foot: Secondary | ICD-10-CM | POA: Diagnosis not present

## 2018-06-23 DIAGNOSIS — Z9889 Other specified postprocedural states: Secondary | ICD-10-CM

## 2018-06-23 NOTE — Progress Notes (Signed)
She presents today for a postop visit date of surgery 04/29/2018 status post Liane Comber bunionectomy right states that is doing better just hurts to still wear my shoe all day.  Objective: Vital signs are stable she is alert and oriented x3 has great range of motion of the first metatarsal phalangeal joint of the right foot mild edema no erythema cellulitis drainage or odor radiographs taken today demonstrate a well-healing osteotomy.  Her graph assessment: Well-healing first metatarsal osteotomy and screw fixation right foot.  Plan: Encourage range of motion exercises and I will follow-up with her in 1 month.

## 2018-06-24 ENCOUNTER — Inpatient Hospital Stay: Admission: RE | Admit: 2018-06-24 | Payer: 59 | Source: Ambulatory Visit

## 2018-06-24 ENCOUNTER — Ambulatory Visit (INDEPENDENT_AMBULATORY_CARE_PROVIDER_SITE_OTHER)
Admission: RE | Admit: 2018-06-24 | Discharge: 2018-06-24 | Disposition: A | Discharge status: Home / Self Care | Payer: 59 | Source: Ambulatory Visit | Attending: Emergency Medicine | Admitting: Emergency Medicine

## 2018-06-24 DIAGNOSIS — J849 Interstitial pulmonary disease, unspecified: Secondary | ICD-10-CM | POA: Diagnosis not present

## 2018-07-11 ENCOUNTER — Ambulatory Visit: Payer: 59 | Admitting: Emergency Medicine

## 2018-07-11 ENCOUNTER — Encounter: Payer: Self-pay | Admitting: Emergency Medicine

## 2018-07-11 ENCOUNTER — Other Ambulatory Visit (HOSPITAL_COMMUNITY): Payer: Self-pay | Admitting: Internal Medicine

## 2018-07-11 VITALS — BP 122/82 | HR 103 | Ht 64.5 in | Wt 180.0 lb

## 2018-07-11 DIAGNOSIS — Z1231 Encounter for screening mammogram for malignant neoplasm of breast: Secondary | ICD-10-CM

## 2018-07-11 DIAGNOSIS — R9389 Abnormal findings on diagnostic imaging of other specified body structures: Secondary | ICD-10-CM | POA: Diagnosis not present

## 2018-07-11 DIAGNOSIS — R911 Solitary pulmonary nodule: Secondary | ICD-10-CM | POA: Diagnosis not present

## 2018-07-11 DIAGNOSIS — K219 Gastro-esophageal reflux disease without esophagitis: Secondary | ICD-10-CM

## 2018-07-11 DIAGNOSIS — J4520 Mild intermittent asthma, uncomplicated: Secondary | ICD-10-CM

## 2018-07-11 DIAGNOSIS — Z23 Encounter for immunization: Secondary | ICD-10-CM | POA: Diagnosis not present

## 2018-07-11 NOTE — Progress Notes (Signed)
Subjective:   Patient ID: [name], female    DOB: 1956/04/14    MRN: [id number]  Brief patient profile:   ROV 07/23/17 --this is a follow-up visit for history of peribronchial nodularity, bronchiectasis and interstitial changes by CT scan of the chest.  She also has a history of mild intermittent asthma, esophageal reflux.  Her last CT scan was approximately a year ago 07/06/16.  Based on her prior discussions we decided not to pursue BAL in absence of significant symptoms.  She reports today that she has some occasional dysphagia. Remains on nexium bid. Minimal cough, using some OTC allergy meds. Minimal mucous production.  She has not used albuterol with any frequency.  Denies any wheezing, good exercise tolerance  ROV 07/11/2018 -- [name] is a 62 year old woman with history of mild intermittent asthma, esophageal reflux.  She also has bronchiectatic change and interstitial changes on CT scan of the chest (last 07/06/2016).  She is currently managed on albuterol prn. Her mobility has been limited by a foot sgy - has gained some wt. She can get SOB w stairs, walking. Occasional wheeze - has not needed any albuterol.  She had a CT scan of her chest done on 06/24/2018 which I reviewed.  This shows some slight progression of her bronchiectatic change, some surrounding micronodular disease with a discrete pulmonary nodule seen in the left upper lobe.  There is some stable right middle lobe and lingular scarring.  Suggestive of possible mycobacterial disease although no significant cough, sputum.         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 No history of auto-immune disease. Distant relative had SLE.  She was dx with PNA and bronchitis frequently as a youngster.   Spirometry 2012 at [facility] > AFL especially small airways. FOB was performed 01/23/14  > all cx data negative.   All auto-immune labs negative (ANCA, ANA, RF, ascl70, SSA/B).     Objective:   Physical Exam Wt Readings from Last 3 Encounters:  07/11/18 180 lb (81.6 kg)  07/23/17 173 lb (78.5 kg)  07/24/16 166 lb (75.3 kg)   Vitals:   07/11/18 1050  BP: 122/82  Pulse: (!) 103  SpO2: 96%  Weight: 180 lb (81.6 kg)  Height: 5' 4.5" (1.638 m)     Gen: Pleasant, well-nourished, in no distress,  normal affect  ENT: No lesions,  mouth clear,  oropharynx clear, no postnasal drip  Neck: No JVD, no stridor  Lungs: No use of accessory muscles, clear without rales or rhonchi  Cardiovascular: RRR, heart sounds normal, no murmur or gallops, no peripheral edema  Musculoskeletal: No deformities, no cyanosis or clubbing  Neuro: alert, non focal  Skin: Warm, no lesions or rashes   06/24/18 --  COMPARISON:  07/06/2016 high-resolution chest CT.  FINDINGS: Cardiovascular: Normal heart size. No significant pericardial effusion/thickening. Left anterior descending coronary atherosclerosis. Atherosclerotic nonaneurysmal thoracic aorta. Normal caliber pulmonary arteries.  Mediastinum/Nodes: No discrete thyroid nodules. Fluid level in mid to lower thoracic esophagus. Stable mild circumferential wall thickening in the lower thoracic esophagus. No pathologically enlarged axillary, mediastinal or hilar lymph nodes, noting limited sensitivity for the detection of hilar adenopathy on this noncontrast study.  Lungs/Pleura: No pneumothorax. No pleural effusion. Moderate centrilobular and paraseptal emphysema with mild diffuse bronchial wall thickening. No acute consolidative  airspace disease or lung masses. Mild-to-moderate cylindrical and varicoid bronchiectasis in lungs bilaterally, most prominent in the right middle lobe and lingula, not appreciably changed. New patchy tree-in-bud opacities in the medial basilar right upper lobe (series 3/image 64). Mild patchy  tree-in-bud opacities and nodularity in the peripheral right middle lobe with dominant 6 mm right middle lobe nodule (series 3/image 80), unchanged. New mild clustered nodularity in the periphery of the left upper lobe with dominant 6 mm peripheral left upper lobe nodule (series 3/image 62). No significant lobular air trapping on the expiration sequence. No significant subpleural reticulation or frank honeycombing. Stable mild subpleural bandlike scarring in the medial right middle lobe and inferior lingula.  Upper abdomen: Stable small hiatal hernia.  Cholecystectomy.  Musculoskeletal: No aggressive appearing focal osseous lesions. Mild thoracic spondylosis.  IMPRESSION: 1. New clustered nodularity/tree-in-bud opacities in the medial basilar right upper lobe and peripheral left upper lobe with dominant new 6 mm peripheral left upper lobe nodule. While probably inflammatory related to progression of MAI given the below findings, non-contrast chest CT at 3-6 months is recommended in this high-risk patient with smoking related changes in the lungs. This recommendation follows the consensus statement: Guidelines for Management of Incidental Pulmonary Nodules Detected on CT Images: From the Fleischner Society 2017; Radiology 2017; 284:228-243. 2. 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 3. Moderate emphysema with mild diffuse bronchial wall thickening, suggesting COPD. 4. Small hiatal hernia. Fluid in the thoracic esophagus, suggesting esophageal dysmotility and/or gastroesophageal reflux. Nonspecific mild circumferential wall thickening in the lower thoracic esophagus, stable, most commonly due to reflux esophagitis, with Barrett's esophagus or neoplasm not excluded. 5. One vessel coronary atherosclerosis.     Assessment & Plan:    Abnormal CT scan, chest With bronchiectasis, some mild associated micronodular disease that is suggestive of MAIC although no clinical symptoms to support.  She also has a small left upper lobe nodule, 6 mm that will need to be followed given her tobacco history.  We will repeat a CT scan of your chest in May 2020 to follow your bronchiectasis and also your small left lung nodule.  Depending on these results we will discuss whether there is any benefit to further work-up including repeat bronchoscopy. Follow with Dr Lamonte Sakai in May 2020 to review your CT scan together.  Mild intermittent asthma Some decreased functional capacity and increased exertional dyspnea since she had change her activity level from foot surgery.  She is working on building up her stamina.  We will continue her albuterol as needed.  Agree with continuing to progress with your exercise now that you are recovering from foot surgery Keep your albuterol available to use 2 puffs up to every 4 hours if needed for shortness of breath, chest tightness, wheezing.  GERD Continue Nexium as you are taking it   Baltazar Apo, MD, PhD 07/11/2018, 11:34 AM [facility] [phone] or if no answer [phone]

## 2018-07-11 NOTE — Patient Instructions (Addendum)
We will repeat a CT scan of your chest in May 2020 to follow your bronchiectasis and also your small left lung nodule.  Depending on these results we will discuss whether there is any benefit to further work-up including repeat bronchoscopy. Agree with continuing to progress with your exercise now that you are recovering from foot surgery Keep your albuterol available to use 2 puffs up to every 4 hours if needed for shortness of breath, chest tightness, wheezing. Continue Nexium as you are taking it Follow with Dr Lamonte Sakai in May 2020 to review your CT scan together.

## 2018-07-11 NOTE — Assessment & Plan Note (Signed)
Continue Nexium as you are taking it

## 2018-07-11 NOTE — Assessment & Plan Note (Signed)
With bronchiectasis, some mild associated micronodular disease that is suggestive of MAIC although no clinical symptoms to support.  She also has a small left upper lobe nodule, 6 mm that will need to be followed given her tobacco history.  We will repeat a CT scan of your chest in May 2020 to follow your bronchiectasis and also your small left lung nodule.  Depending on these results we will discuss whether there is any benefit to further work-up including repeat bronchoscopy. Follow with Dr Lamonte Sakai in May 2020 to review your CT scan together.

## 2018-07-11 NOTE — Assessment & Plan Note (Signed)
Some decreased functional capacity and increased exertional dyspnea since she had change her activity level from foot surgery.  She is working on building up her stamina.  We will continue her albuterol as needed.  Agree with continuing to progress with your exercise now that you are recovering from foot surgery Keep your albuterol available to use 2 puffs up to every 4 hours if needed for shortness of breath, chest tightness, wheezing.

## 2018-07-26 ENCOUNTER — Ambulatory Visit (INDEPENDENT_AMBULATORY_CARE_PROVIDER_SITE_OTHER): Payer: 59 | Admitting: Podiatry

## 2018-07-26 ENCOUNTER — Ambulatory Visit (INDEPENDENT_AMBULATORY_CARE_PROVIDER_SITE_OTHER): Payer: 59

## 2018-07-26 ENCOUNTER — Encounter: Payer: Self-pay | Admitting: Podiatry

## 2018-07-26 DIAGNOSIS — M2011 Hallux valgus (acquired), right foot: Secondary | ICD-10-CM

## 2018-07-26 DIAGNOSIS — N3946 Mixed incontinence: Secondary | ICD-10-CM | POA: Diagnosis not present

## 2018-07-26 DIAGNOSIS — R350 Frequency of micturition: Secondary | ICD-10-CM | POA: Diagnosis not present

## 2018-07-26 DIAGNOSIS — Z9889 Other specified postprocedural states: Secondary | ICD-10-CM

## 2018-07-26 NOTE — Progress Notes (Signed)
She presents today for follow-up of her [place] bunionectomy right foot.  States that the faster walk the more it hurts however over the past few days it has started to feel much better and the pain is gone down.  Date of surgery April 29, 2018.  Objective: She has good range of motion on dorsiflexion plantarflexion is limited with osteoarthritic changes to the sesamoids of her x-rays.  X-rays taken today demonstrate a well-healed first metatarsal osteotomy with internal fixation in good position.  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  Plan: We did discuss the possible use of injections to help break up scar tissue however at this point since she is improving I feel that we should allow her to improve naturally as much as possible.  I will follow-up with her in 3 to 4 months.

## 2018-08-12 ENCOUNTER — Ambulatory Visit (HOSPITAL_COMMUNITY)
Admission: RE | Admit: 2018-08-12 | Discharge: 2018-08-12 | Disposition: A | Discharge status: Home / Self Care | Payer: 59 | Source: Ambulatory Visit | Attending: Internal Medicine | Admitting: Internal Medicine

## 2018-08-12 DIAGNOSIS — Z1231 Encounter for screening mammogram for malignant neoplasm of breast: Secondary | ICD-10-CM | POA: Insufficient documentation

## 2018-08-15 ENCOUNTER — Ambulatory Visit (HOSPITAL_COMMUNITY): Payer: 59

## 2018-09-02 DIAGNOSIS — E119 Type 2 diabetes mellitus without complications: Secondary | ICD-10-CM | POA: Diagnosis not present

## 2018-09-02 DIAGNOSIS — J4520 Mild intermittent asthma, uncomplicated: Secondary | ICD-10-CM | POA: Diagnosis not present

## 2018-09-02 DIAGNOSIS — R911 Solitary pulmonary nodule: Secondary | ICD-10-CM | POA: Insufficient documentation

## 2018-09-02 DIAGNOSIS — J439 Emphysema, unspecified: Secondary | ICD-10-CM | POA: Diagnosis not present

## 2018-09-15 DIAGNOSIS — R0789 Other chest pain: Secondary | ICD-10-CM | POA: Diagnosis not present

## 2018-09-15 DIAGNOSIS — R000 Tachycardia, unspecified: Secondary | ICD-10-CM | POA: Diagnosis not present

## 2018-09-15 DIAGNOSIS — I10 Essential (primary) hypertension: Secondary | ICD-10-CM | POA: Diagnosis not present

## 2018-10-27 ENCOUNTER — Encounter: Payer: Self-pay | Admitting: Podiatry

## 2018-10-27 ENCOUNTER — Ambulatory Visit: Payer: 59 | Admitting: Podiatry

## 2018-10-27 ENCOUNTER — Ambulatory Visit (INDEPENDENT_AMBULATORY_CARE_PROVIDER_SITE_OTHER): Payer: 59

## 2018-10-27 DIAGNOSIS — Z9889 Other specified postprocedural states: Secondary | ICD-10-CM | POA: Diagnosis not present

## 2018-10-27 DIAGNOSIS — M779 Enthesopathy, unspecified: Secondary | ICD-10-CM

## 2018-10-27 DIAGNOSIS — M778 Other enthesopathies, not elsewhere classified: Secondary | ICD-10-CM

## 2018-10-27 DIAGNOSIS — M2011 Hallux valgus (acquired), right foot: Secondary | ICD-10-CM | POA: Diagnosis not present

## 2018-10-27 NOTE — Progress Notes (Signed)
Subjective:  Patient ID: [name], female    DOB: 1955/11/09,  MRN: [id number] HPI Chief Complaint  Patient presents with  . Routine Post Op    DOS 04-29-18  [place] bunionectomy right    "The more I walk, the more it hurts"    63 y.o. female presents with the above complaint.   ROS: Denies fever chills nausea vomiting muscle aches pains calf pain back pain chest pain shortness of breath.  Date of surgery May 09, 2018 status post Liane Comber bunionectomy right states that the more I walked the more that it hurts she says is really sore right under here she refers to the fibular sesamoid of the right foot.  Past Medical History:  Diagnosis Date  . Allergy   . GERD (gastroesophageal reflux disease)   . Heartburn   . High blood pressure    Past Surgical History:  Procedure Laterality Date  . ABDOMINAL HYSTERECTOMY    . CHOLECYSTECTOMY    . COLONOSCOPY  2010   Dr. Aviva Signs: four polyps in rectum and sigmoid colon (hyperplastic polyps)  . ESOPHAGOGASTRODUODENOSCOPY N/A 06/08/2014   SLF: 1. No obvious source for dyspepsia identified. 2. Medium sized hiatal hernia 3. Mild non-erosive gastritis.  Marland Kitchen ESOPHAGOGASTRODUODENOSCOPY  2010   Dr. Aviva Signs: hiatal hernia  . EUS  05/21/10   outlaw:hyperechoic liver mild consistent with steatosis otherwise normal [place]  . GALLBLADDER SURGERY  2004  . LAPAROSCOPIC NISSEN FUNDOPLICATION N/A 11/91/4782   Procedure: LAPAROSCOPIC NISSEN FUNDOPLICATION;  Surgeon: Jamesetta So, MD;  Location: [facility];  Service: General;  Laterality: N/A;  . TONSILLECTOMY    . VIDEO BRONCHOSCOPY Bilateral 01/23/2014   Procedure: VIDEO BRONCHOSCOPY WITHOUT FLUORO;  Surgeon: Collene Gobble, MD;  Location: [facility];  Service: Cardiopulmonary;  Laterality: Bilateral;    Current Outpatient Medications:  .  albuterol (PROVENTIL HFA;VENTOLIN HFA) 108 (90 Base) MCG/ACT inhaler, Inhale 2 puffs into the lungs every 6 (six) hours as needed for wheezing or shortness of  breath., Disp: 1 Inhaler, Rfl: 5 .  aspirin EC 81 MG tablet, Take 81 mg by mouth daily., Disp: , Rfl:  .  Biotin 5000 MCG TABS, Take 1 tablet by mouth daily., Disp: , Rfl:  .  cyclobenzaprine (FLEXERIL) 10 MG tablet, take 1 tablet twice a day (Patient taking differently: take 1 tablet daily), Disp: 60 tablet, Rfl: 1 .  esomeprazole (NEXIUM) 40 MG capsule, Take 40 mg by mouth daily at 12 noon. , Disp: , Rfl:  .  irbesartan (AVAPRO) 150 MG tablet, TK 1 T PO QD, Disp: , Rfl: 0 .  Melatonin 5 MG TABS, Take 1 tablet by mouth at bedtime., Disp: , Rfl:  .  rosuvastatin (CRESTOR) 10 MG tablet, Take 10 mg by mouth daily., Disp: , Rfl:  .  Trospium Chloride 60 MG CP24, Take 1 capsule by mouth daily., Disp: , Rfl:  .  verapamil (VERELAN PM) 240 MG 24 hr capsule, Take 240 mg by mouth at bedtime., Disp: , Rfl:   Allergies  Allergen Reactions  . Dexlansoprazole Diarrhea, Other (See Comments) and Nausea And Vomiting    N/V/D  . Oxycodone-Acetaminophen Other (See Comments)    extreme headache  . Percocet [Oxycodone-Acetaminophen]     Vomit and extreme headache  . Latex Rash   Review of Systems Objective:  There were no vitals filed for this visit.  General: Well developed, nourished, in no acute distress, alert and oriented x3   Dermatological: Skin is warm, dry and  supple bilateral. Nails x 10 are well maintained; remaining integument appears unremarkable at this time. There are no open sores, no preulcerative lesions, no rash or signs of infection present.  Vascular: Dorsalis Pedis artery and Posterior Tibial artery pedal pulses are 2/4 bilateral with immedate capillary fill time. Pedal hair growth present. No varicosities and no lower extremity edema present bilateral.   Neruologic: Grossly intact via light touch bilateral. Vibratory intact via tuning fork bilateral. Protective threshold with Semmes Wienstein monofilament intact to all pedal sites bilateral. Patellar and Achilles deep tendon  reflexes 2+ bilateral. No Babinski or clonus noted bilateral.   Musculoskeletal: No gross boney pedal deformities bilateral. No pain, crepitus, or limitation noted with foot and ankle range of motion bilateral. Muscular strength 5/5 in all groups tested bilateral.  She has pain on direct palpation of the fibular sesamoid but has good range of motion of the first metatarsophalangeal joint dorsally limited on plantarflexion.  Gait: Unassisted, Nonantalgic.    Radiographs:  Radiographs taken today do demonstrate a rectus first metatarsophalangeal joint with good internal fixation in good position.  Fibular sesamoid does appear to be fractured with some fluffy margin but this appears to be old and not new.  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  Painful internal fixation.  Restricted range of motion of the first metatarsal phalangeal joint.  Plan: Discussed etiology pathology conservative surgical therapies at this point time I discussed the need for surgical intervention I also injected 20 mg Kenalog 5 mg Marcaine point of maximal tenderness to the lateral aspect of the fibular sesamoid.  She will follow-up with me in a few months or weeks to discuss surgery once again if necessary.  She understands it and is amenable to it.  At that time we will remove the first metatarsophalangeal joint internal fixation release the first metatarsophalangeal joint and remove the fibular sesamoid.      T. [place], [place]

## 2018-11-11 ENCOUNTER — Telehealth: Payer: Self-pay | Admitting: Emergency Medicine

## 2018-11-11 NOTE — Telephone Encounter (Signed)
ATC Patient.  LMTCB. Will try again at a later time.

## 2018-11-15 NOTE — Telephone Encounter (Signed)
Pt is returning call. Cb is [phone].

## 2018-11-15 NOTE — Telephone Encounter (Signed)
Spoke with pt. She has been scheduled for her routine follow up with RB on 03/14/2019 at 9am. Nothing further was needed.

## 2018-11-15 NOTE — Telephone Encounter (Signed)
Pt has had an ROV scheduled with Dr. Lamonte Sakai for July 2020. Nothing further needed.

## 2018-12-27 ENCOUNTER — Other Ambulatory Visit: Payer: Self-pay

## 2018-12-27 MED ORDER — VERAPAMIL HCL ER 240 MG PO CP24: 240.0000 mg | ORAL_CAPSULE | Freq: Every day | ORAL | 1 refills | Status: DC

## 2018-12-27 MED ORDER — ROSUVASTATIN CALCIUM 10 MG PO TABS: 10.0000 mg | ORAL_TABLET | Freq: Every day | ORAL | 0 refills | Status: DC

## 2018-12-28 DIAGNOSIS — M47812 Spondylosis without myelopathy or radiculopathy, cervical region: Secondary | ICD-10-CM | POA: Diagnosis not present

## 2019-01-05 ENCOUNTER — Telehealth: Payer: Self-pay | Admitting: *Deleted

## 2019-01-05 NOTE — Telephone Encounter (Signed)
Called pt to go over pre screening questions she decided she wanted to change appt to the following Friday.

## 2019-01-06 ENCOUNTER — Inpatient Hospital Stay: Admission: RE | Admit: 2019-01-06 | Payer: 59 | Source: Ambulatory Visit

## 2019-01-09 ENCOUNTER — Other Ambulatory Visit: Payer: 59

## 2019-01-13 ENCOUNTER — Ambulatory Visit (INDEPENDENT_AMBULATORY_CARE_PROVIDER_SITE_OTHER)
Admission: RE | Admit: 2019-01-13 | Discharge: 2019-01-13 | Disposition: A | Discharge status: Home / Self Care | Payer: 59 | Source: Ambulatory Visit | Attending: Emergency Medicine | Admitting: Emergency Medicine

## 2019-01-13 ENCOUNTER — Telehealth: Payer: Self-pay | Admitting: *Deleted

## 2019-01-13 ENCOUNTER — Other Ambulatory Visit: Payer: Self-pay

## 2019-01-13 DIAGNOSIS — R911 Solitary pulmonary nodule: Secondary | ICD-10-CM | POA: Diagnosis not present

## 2019-01-13 NOTE — Telephone Encounter (Signed)
COVID-19 Pre-Screening Questions:  In the past 7 to 10 days have you had a cough, shortness of breath, headache, congestion, fever, body aches, chills, sore throat, or sudden loss of taste or sense of smell? No  Have you been around anyone with known Covid 19. No  Have you been around anyone who is awaiting Covid 19 test results in the past 7 to 10 days? No  Have you been around anyone who has been exposed to Covid 19, or has mentioned symptoms of Covid 19 within the past 7 to 10 days? NO  If you have any concerns about symptoms your patients report please contact your leadership team, or the provider the patient is seeing in the office for further guidance.

## 2019-02-07 ENCOUNTER — Other Ambulatory Visit: Payer: Self-pay

## 2019-02-07 MED ORDER — IRBESARTAN 150 MG PO TABS: ORAL_TABLET | ORAL | 0 refills | Status: DC

## 2019-03-02 ENCOUNTER — Encounter: Payer: Self-pay | Admitting: Cardiology

## 2019-03-02 ENCOUNTER — Ambulatory Visit (INDEPENDENT_AMBULATORY_CARE_PROVIDER_SITE_OTHER): Payer: 59 | Admitting: Cardiology

## 2019-03-02 ENCOUNTER — Other Ambulatory Visit: Payer: Self-pay

## 2019-03-02 VITALS — BP 122/77 | HR 79 | Ht 64.0 in | Wt 176.0 lb

## 2019-03-02 DIAGNOSIS — R000 Tachycardia, unspecified: Secondary | ICD-10-CM | POA: Diagnosis not present

## 2019-03-02 DIAGNOSIS — I10 Essential (primary) hypertension: Secondary | ICD-10-CM

## 2019-03-02 DIAGNOSIS — E7800 Pure hypercholesterolemia, unspecified: Secondary | ICD-10-CM | POA: Diagnosis not present

## 2019-03-02 DIAGNOSIS — I2510 Atherosclerotic heart disease of native coronary artery without angina pectoris: Secondary | ICD-10-CM

## 2019-03-02 NOTE — Progress Notes (Signed)
Primary Physician:  Haywood Pao, MD   Patient ID: [name], female    DOB: May 22, 1956, 63 y.o.   MRN: [id number]  Subjective:    Chief Complaint  Patient presents with  . Tachycardia  . Hypertension  . Follow-up   This visit type was conducted due to national recommendations for restrictions regarding the COVID-19 Pandemic (e.g. social distancing).  This format is felt to be most appropriate for this patient at this time.  All issues noted in this document were discussed and addressed.  No physical exam was performed (except for noted visual exam findings with Telehealth visits).  The patient has consented to conduct a Telehealth visit and understands insurance will be billed.   I discussed the limitations of evaluation and management by telemedicine and the availability of in person appointments. The patient expressed understanding and agreed to proceed.  Virtual Visit via Video Note is as below  I connected with [name], on 03/02/19 at 1100 by telephone and verified that I am speaking with the correct person using two identifiers. Unable to perform video visit as patient did not have equipment.    I have discussed with the patient regarding the safety during COVID Pandemic and steps and precautions including social distancing with the patient.    HPI: [name]  is a 63 y.o. female  with hypertension, tachycardia, hyperlipidemia, and prediabetes.   Last seen 6 months ago, and was noted to again be slightly tachycardic that was felt to be related to deconditioning.   Patient is doing well without any signigicant complaint. Underwent bunionectomy in Fall of 2019 and continues to have pain in right foot. She was previously able to walk 2 miles; however, due to right foot pain is only able to walk 30 mins to 1 hour. She has a history of chronic chest pain; however, reports over the last year has not had any episodes of this. Has some chest tightness with GERD that is  resolved with Tums. GERD is chronic and has not worsened. Denies any PND, orthopnea, dizziness, synocpe, or symptoms suggestive of claudication or TIA. Does have leg swelling particularly at the end of the day. No worsening shortness of breath.  She is followed by Dr. Lamonte Sakai and had follow up CT scan in May for abnormalities noted on previous CT scan in Nov 2019. Has LAD atherosclerosis and aortic atherosclerosis present that was also noted in Nov 2019. She is tolerating medications well. Reports her hyperlipidemia is well controlled. Had labs performed this morning with PCP.  Past Medical History:  Diagnosis Date  . Allergy   . GERD (gastroesophageal reflux disease)   . Heartburn   . High blood pressure     Past Surgical History:  Procedure Laterality Date  . ABDOMINAL HYSTERECTOMY    . CHOLECYSTECTOMY    . COLONOSCOPY  2010   Dr. Aviva Signs: four polyps in rectum and sigmoid colon (hyperplastic polyps)  . ESOPHAGOGASTRODUODENOSCOPY N/A 06/08/2014   SLF: 1. No obvious source for dyspepsia identified. 2. Medium sized hiatal hernia 3. Mild non-erosive gastritis.  Marland Kitchen ESOPHAGOGASTRODUODENOSCOPY  2010   Dr. Aviva Signs: hiatal hernia  . EUS  05/21/10   outlaw:hyperechoic liver mild consistent with steatosis otherwise normal [place]  . GALLBLADDER SURGERY  2004  . LAPAROSCOPIC NISSEN FUNDOPLICATION N/A 21/97/5883   Procedure: LAPAROSCOPIC NISSEN FUNDOPLICATION;  Surgeon: Jamesetta So, MD;  Location: [facility];  Service: General;  Laterality: N/A;  . TONSILLECTOMY    .  VIDEO BRONCHOSCOPY Bilateral 01/23/2014   Procedure: VIDEO BRONCHOSCOPY WITHOUT FLUORO;  Surgeon: Collene Gobble, MD;  Location: [facility];  Service: Cardiopulmonary;  Laterality: Bilateral;    Social History   Socioeconomic History  . Marital status: Married    Spouse name: Not on file  . Number of children: 1  . Years of education: Not on file  . Highest education level: Not on file  Occupational History  . Not on file   Social Needs  . Financial resource strain: Not on file  . Food insecurity    Worry: Not on file    Inability: Not on file  . Transportation needs    Medical: Not on file    Non-medical: Not on file  Tobacco Use  . Smoking status: Former Smoker    Packs/day: 1.50    Years: 30.00    Pack years: 45.00    Types: Cigarettes    Quit date: 05/24/2008    Years since quitting: 10.7  . Smokeless tobacco: Never Used  . Tobacco comment: quit x 6 years  Substance and Sexual Activity  . Alcohol use: No  . Drug use: No  . Sexual activity: Not on file  Lifestyle  . Physical activity    Days per week: Not on file    Minutes per session: Not on file  . Stress: Not on file  Relationships  . Social Herbalist on phone: Not on file    Gets together: Not on file    Attends religious service: Not on file    Active member of club or organization: Not on file    Attends meetings of clubs or organizations: Not on file    Relationship status: Not on file  . Intimate partner violence    Fear of current or ex partner: Not on file    Emotionally abused: Not on file    Physically abused: Not on file    Forced sexual activity: Not on file  Other Topics Concern  . Not on file  Social History Narrative  . Not on file    Review of Systems  Constitution: Negative for decreased appetite, malaise/fatigue, weight gain and weight loss.  Eyes: Negative for visual disturbance.  Cardiovascular: Positive for dyspnea on exertion and leg swelling (at the end of the day). Negative for chest pain, claudication, orthopnea, palpitations and syncope.  Respiratory: Negative for hemoptysis and wheezing.   Endocrine: Negative for cold intolerance and heat intolerance.  Hematologic/Lymphatic: Does not bruise/bleed easily.  Skin: Negative for nail changes.  Musculoskeletal: Positive for joint pain (right foot). Negative for muscle weakness and myalgias.  Gastrointestinal: Negative for abdominal pain, change  in bowel habit, nausea and vomiting.  Neurological: Negative for difficulty with concentration, dizziness, focal weakness and headaches.  Psychiatric/Behavioral: Negative for altered mental status and suicidal ideas.  All other systems reviewed and are negative.     Objective:  Blood pressure 122/77, pulse 79, height 5' 4"  (1.626 m), weight 176 lb (79.8 kg). Body mass index is 30.21 kg/m.    Physical exam not performed or limited due to virtual visit.    Please see exam details from prior visit is as below.   Physical Exam  Constitutional: She is oriented to person, place, and time. Vital signs are normal. She appears well-developed and well-nourished.  HENT:  Head: Normocephalic and atraumatic.  Neck: Normal range of motion.  Cardiovascular: Normal rate, regular rhythm, normal heart sounds and intact distal pulses.  1+  pitting edema  Pulmonary/Chest: Effort normal and breath sounds normal. No accessory muscle usage. No respiratory distress.  Abdominal: Soft. Bowel sounds are normal.  Musculoskeletal: Normal range of motion.  Neurological: She is alert and oriented to person, place, and time.  Skin: Skin is warm and dry.  Vitals reviewed.  Radiology:  CT Super D of chest 12/2018:  1. Significant regression of previously noted peribronchovascular micro and macronodularity, as above. 2. 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 3. Diffuse bronchial wall thickening with moderate centrilobular and paraseptal emphysema; imaging findings suggestive of underlying COPD. 4. Aortic atherosclerosis, in addition to left anterior descending coronary artery disease. Please note that although the presence of coronary artery calcium documents the presence of coronary artery disease, the severity of this  disease and any potential stenosis cannot be assessed on this non-gated CT examination. Assessment for potential risk factor modification, dietary therapy or pharmacologic therapy may be warranted, if clinically indicated.  CT of the chest 06/24/2018:  1. New clustered nodularity/tree-in-bud opacities in the medial basilar right upper lobe and peripheral left upper lobe with dominant new 6 mm peripheral left upper lobe nodule. While probably inflammatory related to progression of MAI given the below findings, non-contrast chest CT at 3-6 months is recommended in this high-risk patient with smoking related changes in the lungs. This recommendation follows the consensus statement: Guidelines for Management of Incidental Pulmonary Nodules Detected on CT Images: From the Fleischner Society 2017; Radiology 2017; 284:228-243. 2. 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 3. Moderate emphysema with mild diffuse bronchial wall thickening, suggesting COPD. 4. Small hiatal hernia. Fluid in the thoracic esophagus, suggesting esophageal dysmotility and/or gastroesophageal reflux. Nonspecific mild circumferential wall thickening in the lower thoracic esophagus, stable, most commonly due to reflux esophagitis, with Barrett's esophagus or neoplasm not excluded. 5. One vessel coronary atherosclerosis.  Laboratory examination:   02/21/2018: Creatinine 0.9, EGFR 63/76, potassium 4.5, normal. CBC normal. Cholesterol 143, triglycerides 76, HDL 56, LDL 72.  CMP Latest Ref Rng & Units 08/05/2014 08/04/2014 07/25/2014  Glucose 70 - 99 mg/dL 102(H) 116(H) 98  BUN 6 - 23 mg/dL 11 15 25(H)  Creatinine 0.50 - 1.10 mg/dL 0.80 0.89 0.95  Sodium 137 - 147 mEq/L 139 139 140  Potassium 3.7 - 5.3 mEq/L 3.9 4.4 4.2  Chloride 96 - 112 mEq/L 102 101 102  CO2 19 - 32 mEq/L  26 25 27   Calcium 8.4 - 10.5 mg/dL 8.6 8.8 9.2  Total Protein - - - -  Total Bilirubin - - - -  Alkaline Phos - - - -  AST - - - -  ALT - - - -   CBC Latest Ref Rng & Units 08/05/2014 08/04/2014 07/25/2014  WBC 4.0 - 10.5 K/uL 8.9 10.1 9.5  Hemoglobin 12.0 - 15.0 g/dL 13.2 13.2 15.2(H)  Hematocrit 36.0 - 46.0 % 39.3 39.7 45.8  Platelets 150 - 400 K/uL 175 234 232   Lipid Panel     Component Value Date/Time   CHOL  10/05/2007 0400    190        ATP III CLASSIFICATION:  <200     mg/dL   Desirable  200-239  mg/dL   Borderline High  >=240    mg/dL   High  TRIG 114 10/05/2007 0400   HDL 63 10/05/2007 0400   CHOLHDL 3.0 10/05/2007 0400   VLDL 23 10/05/2007 0400   LDLCALC (H) 10/05/2007 0400    104        Total Cholesterol/HDL:CHD Risk Coronary Heart Disease Risk Table                     Men   Women  1/2 Average Risk   3.4   3.3   HEMOGLOBIN A1C No results found for: HGBA1C, MPG TSH No results for input(s): TSH in the last 8760 hours.  PRN Meds:. Medications Discontinued During This Encounter  Medication Reason  . Biotin 5000 MCG TABS Error   Current Meds  Medication Sig  . albuterol (PROVENTIL HFA;VENTOLIN HFA) 108 (90 Base) MCG/ACT inhaler Inhale 2 puffs into the lungs every 6 (six) hours as needed for wheezing or shortness of breath.  Marland Kitchen aspirin EC 81 MG tablet Take 81 mg by mouth daily.  . cyclobenzaprine (FLEXERIL) 10 MG tablet take 1 tablet twice a day (Patient taking differently: daily. )  . esomeprazole (NEXIUM) 40 MG capsule Take 40 mg by mouth daily at 12 noon.   . irbesartan (AVAPRO) 150 MG tablet TK 1 T PO QD  . Melatonin 5 MG TABS Take 1 tablet by mouth at bedtime.  . rosuvastatin (CRESTOR) 10 MG tablet Take 1 tablet (10 mg total) by mouth daily.  . Trospium Chloride 60 MG CP24 Take 1 capsule by mouth daily.  . verapamil (VERELAN PM) 240 MG 24 hr capsule Take 1 capsule (240 mg total) by mouth at bedtime.    Cardiac Studies:   Nuclear stress test  [01-Sep-2016]: 1. The resting electrocardiogram demonstrated normal sinus rhythm, RBBB, no resting arrhythmias and normal rest repolarization. The stress electrocardiogram was normal. Patient exercised on Bruce protocol for 7:00 minutes and achieved 7.05 METS. Stress test terminated due to dyspnea and 91% MPHR achieved (Target HR >85%). Hypertensive Bp response (200/98 mm Hg peak). 2. Myocardial perfusion imaging is normal. Overall left ventricular systolic function was normal without regional wall motion abnormalities. The left ventricular ejection fraction was 71%.  Echocardiogram [07/22/2016]: 1. Left ventricle cavity is normal in size. Hyperdynamic global wall motion. Visual EF is >70%. Doppler evidence of grade I (impaired) diastolic dysfunction, Calculated EF 67%. 2. Mild (Grade I) mitral regurgitation. 3. Mild tricuspid regurgitation. Can't calculate PA pressure accuratrely due to insignificant TR signal.  Assessment:     ICD-10-CM   1. Essential hypertension  I10   2. Sinus tachycardia  R00.0   3. Coronary artery calcification seen on CT scan  I25.10   4. Pure hypercholesterolemia  E78.00     EKG 09/16/2018: Sinus rhythm at a rate of 84 bpm, rightward axis, RBBB, no evidence of ischemia. Compared to EKG 09/16/2017, complete RBBB  Recommendations:   Patient is doing well, she has not had any episodes of chest pain.  Does have GERD which is chronic and occasionally has some chest discomfort with this but this resolved with Tums.  Tachycardia has been stable with current medical therapy.  Blood pressure is also stable.    Reports having labs performed this morning at PCP office and will see him next week to discuss the results.  We'll request for our records.  Hyperlipidemia previously was well controlled in July of last year.  Would recommend aggressive risk factor control in view of atherosclerosis in the LAD noted on CT scan.  Patient was concerned when she  recently read her CT  scan report.  She has had negative nuclear stress test in 2017 and is currently asymptomatic.  I have advised her that as her atherosclerosis was also present in 2019 and in view of lack of symptoms at this time, would not recommend further testing, but would continue with aggressive risk factor control with aspirin and statin therapy.  Advise her to contact me should she have any concerning symptoms.  No history of diabetes.  I'll see her back in 6 months for follow-up or sooner if needed.  Miquel Dunn, MSN, APRN, FNP-C [facility]. [place] Office: [phone] Fax: [phone]

## 2019-03-05 ENCOUNTER — Encounter: Payer: Self-pay | Admitting: Cardiology

## 2019-03-09 DIAGNOSIS — I2510 Atherosclerotic heart disease of native coronary artery without angina pectoris: Secondary | ICD-10-CM | POA: Insufficient documentation

## 2019-03-14 ENCOUNTER — Ambulatory Visit: Payer: 59 | Admitting: Emergency Medicine

## 2019-03-14 ENCOUNTER — Other Ambulatory Visit: Payer: Self-pay

## 2019-03-14 ENCOUNTER — Encounter: Payer: Self-pay | Admitting: Emergency Medicine

## 2019-03-14 DIAGNOSIS — K219 Gastro-esophageal reflux disease without esophagitis: Secondary | ICD-10-CM | POA: Diagnosis not present

## 2019-03-14 DIAGNOSIS — R9389 Abnormal findings on diagnostic imaging of other specified body structures: Secondary | ICD-10-CM | POA: Diagnosis not present

## 2019-03-14 DIAGNOSIS — J4520 Mild intermittent asthma, uncomplicated: Secondary | ICD-10-CM | POA: Diagnosis not present

## 2019-03-14 NOTE — Assessment & Plan Note (Signed)
Nexium as ordered 

## 2019-03-14 NOTE — Assessment & Plan Note (Signed)
Stable dyspnea, albuterol use.  No clear indication for long-acting bronchodilator at this time.  We will continue surveillance.  She knows to get the flu shot in the fall.

## 2019-03-14 NOTE — Progress Notes (Signed)
Subjective:   Patient ID: [name], female    DOB: August 26, 1955    MRN: [id number]  Brief patient profile:   ROV 07/23/17 --this is a follow-up visit for history of peribronchial nodularity, bronchiectasis and interstitial changes by CT scan of the chest.  She also has a history of mild intermittent asthma, esophageal reflux.  Her last CT scan was approximately a year ago 07/06/16.  Based on her prior discussions we decided not to pursue BAL in absence of significant symptoms.  She reports today that she has some occasional dysphagia. Remains on nexium bid. Minimal cough, using some OTC allergy meds. Minimal mucous production.  She has not used albuterol with any frequency.  Denies any wheezing, good exercise tolerance  ROV 07/11/2018 -- [name] is a 63 year old woman with history of mild intermittent asthma, esophageal reflux.  She also has bronchiectatic change and interstitial changes on CT scan of the chest (last 07/06/2016).  She is currently managed on albuterol prn. Her mobility has been limited by a foot sgy - has gained some wt. She can get SOB w stairs, walking. Occasional wheeze - has not needed any albuterol.  She had a CT scan of her chest done on 06/24/2018 which I reviewed.  This shows some slight progression of her bronchiectatic change, some surrounding micronodular disease with a discrete pulmonary nodule seen in the left upper lobe.  There is some stable right middle lobe and lingular scarring.  Suggestive of possible mycobacterial disease although no significant cough, sputum.         Exposures > has lived in [place] and [place], she has owned dogs, cats, never birds, no mold exposures Has worked in a Education officer, community, 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 No history of auto-immune disease. Distant relative had SLE.  She was dx with PNA and bronchitis frequently as a youngster.   Spirometry 2012 at [facility] > AFL especially small airways. FOB was performed 01/23/14  > all cx data negative.   All auto-immune labs negative (ANCA, ANA, RF, ascl70, SSA/B).    ROV 03/14/2019 --this is a follow-up visit for 63 year old woman with history of mild intermittent asthma and bronchiectasis on CT scan of the chest with some associated pulmonary nodular disease we have been following with serial films.  There was some progression on her CT from November 2019 so we repeated a CT on 01/14/2019 which I reviewed and shows significant regression of her peribronchovascular nodularity, persistent scattered cylindrical bronchiectasis and small nodular disease suspicious for atypical mycobacterial process.  Overall reassuring scan.  She reports today that she has some exertional SOB, especially w hills - she believes stable.  She has albuterol that she can use as needed, uses approximately 1-2 times daily. Coughs few times a day - dry. Remains on GERD therapy.    Objective:   Physical Exam Wt Readings from Last 3 Encounters:  03/14/19 178 lb 12.8 oz (81.1 kg)  03/02/19 176 lb (79.8 kg)  07/11/18 180 lb (81.6 kg)   Vitals:   03/14/19 0906  BP: 118/72  Pulse: 78  Temp: 97.7 F (36.5 C)  TempSrc: Oral  SpO2: 98%  Weight: 178 lb 12.8 oz (81.1 kg)  Height: 5' 4.5" (1.638 m)     Gen: Pleasant, well-nourished, in no distress,  normal affect  ENT: No lesions,  mouth clear,  oropharynx clear, no postnasal drip  Neck: No JVD, no stridor  Lungs: No use of accessory  muscles, clear without rales or rhonchi  Cardiovascular: RRR, heart sounds normal, no murmur or gallops, no peripheral edema  Musculoskeletal: No deformities, no cyanosis or clubbing  Neuro: alert, non focal  Skin: Warm, no lesions or rashes  CT chest 01/14/2019 --   COMPARISON:  Chest CT 06/24/2018.  FINDINGS: Cardiovascular: Heart size is normal. There is no significant pericardial fluid, thickening or pericardial calcification. There is aortic atherosclerosis, as well as atherosclerosis of the great  vessels of the mediastinum and the coronary arteries, including calcified atherosclerotic plaque in the left anterior descending coronary artery.  Mediastinum/Nodes: No pathologically enlarged mediastinal or hilar lymph nodes. Please note that accurate exclusion of hilar adenopathy is limited on noncontrast CT scans. Circumferential thickening of the distal esophagus. No axillary lymphadenopathy.  Lungs/Pleura: When compared to the prior study the extent from 06/24/2018 of peribronchovascular micro and macronodularity has significantly regressed, particularly in the right middle lobe, medial aspect of the right upper lobe and periphery of the left upper lobe. Several small pulmonary nodules remain, either stable or smaller in size compared to the prior examination, largest of which measures 6 x 4 mm (mean diameter 5 mm) on axial image 81 of series 3. No larger more suspicious appearing pulmonary nodules or masses are noted. No acute consolidative airspace disease. No pleural effusions. Diffuse bronchial wall thickening with moderate centrilobular and paraseptal emphysema. Scattered areas of cylindrical bronchiectasis with some associated thickening of the peribronchovascular interstitium and regional architectural distortion, most evident in the medial segment of the right middle lobe and inferior segment of the lingula.  Upper Abdomen: Aortic atherosclerosis.  Status post cholecystectomy.  Musculoskeletal: There are no aggressive appearing lytic or blastic lesions noted in the visualized portions of the skeleton.  IMPRESSION: 1. Significant regression of previously noted peribronchovascular micro and macronodularity, as above. 2. 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 3. Diffuse bronchial  wall thickening with moderate centrilobular and paraseptal emphysema; imaging findings suggestive of underlying COPD. 4. Aortic atherosclerosis, in addition to left anterior descending coronary artery disease. Please note that although the presence of coronary artery calcium documents the presence of coronary artery disease, the severity of this disease and any potential stenosis cannot be assessed on this non-gated CT examination. Assessment for potential risk factor modification, dietary therapy or pharmacologic therapy may be warranted, if clinically indicated.     Assessment & Plan:   Mild intermittent asthma Stable dyspnea, albuterol use.  No clear indication for long-acting bronchodilator at this time.  We will continue surveillance.  She knows to get the flu shot in the fall.  Abnormal CT scan, chest With bronchiectasis and micronodular disease.  We have never mycobacterial disease.  She does not make significant sputum.  Cough burden is low.  Her micro-and macronodular disease on her most recent scan was improved.  We will continue to follow clinically and at that determine if or when she needs repeat imaging.  GERD Nexium as ordered   Baltazar Apo, MD, PhD 03/14/2019, 9:23 AM [facility] [phone] or if no answer [phone]

## 2019-03-14 NOTE — Assessment & Plan Note (Signed)
With bronchiectasis and micronodular disease.  We have never mycobacterial disease.  She does not make significant sputum.  Cough burden is low.  Her micro-and macronodular disease on her most recent scan was improved.  We will continue to follow clinically and at that determine if or when she needs repeat imaging.

## 2019-03-14 NOTE — Patient Instructions (Addendum)
Your CT scan of the chest shows improvement in your inflammatory changes compared with November 2019.  This is good news. Keep your albuterol available to use 2 puffs if needed for shortness of breath, chest tightness, wheezing.  Keep track of how often you use this so that we can discuss going forward. Continue your Nexium as you have been taking it. Follow with Dr. Lamonte Sakai in 12 months or sooner if you have any problems.

## 2019-05-15 ENCOUNTER — Other Ambulatory Visit: Payer: Self-pay | Admitting: Cardiology

## 2019-06-13 ENCOUNTER — Encounter: Payer: Self-pay | Admitting: Gastroenterology

## 2019-07-17 ENCOUNTER — Other Ambulatory Visit (HOSPITAL_COMMUNITY): Payer: Self-pay | Admitting: Internal Medicine

## 2019-07-17 DIAGNOSIS — Z1231 Encounter for screening mammogram for malignant neoplasm of breast: Secondary | ICD-10-CM

## 2019-08-03 ENCOUNTER — Ambulatory Visit (INDEPENDENT_AMBULATORY_CARE_PROVIDER_SITE_OTHER): Payer: Self-pay | Admitting: *Deleted

## 2019-08-03 ENCOUNTER — Other Ambulatory Visit: Payer: Self-pay

## 2019-08-03 DIAGNOSIS — Z1211 Encounter for screening for malignant neoplasm of colon: Secondary | ICD-10-CM

## 2019-08-03 MED ORDER — NA SULFATE-K SULFATE-MG SULF 17.5-3.13-1.6 GM/177ML PO SOLN: 1.0000 | Freq: Once | ORAL | 0 refills | Status: AC

## 2019-08-03 NOTE — Progress Notes (Addendum)
Gastroenterology Pre-Procedure Review  Request Date: 08/03/2019 Requesting Physician: 10 year recall, Last TCS 10/12/2008, 4 polyps, hyperplastic polyps  PATIENT REVIEW QUESTIONS: The patient responded to the following health history questions as indicated:    1. Diabetes Melitis: no 2. Joint replacements in the past 12 months: no 3. Major health problems in the past 3 months: no 4. Has an artificial valve or MVP: no 5. Has a defibrillator: no 6. Has been advised in past to take antibiotics in advance of a procedure like teeth cleaning: no 7. Family history of colon cancer: no  8. Alcohol Use: no 9. Illicit drug Use: no 10. History of sleep apnea: no  11. History of coronary artery or other vascular stents placed within the last 12 months: no 12. History of any prior anesthesia complications: yes, nausea 13. There is no height or weight on file to calculate BMI.ht: 5'4 1/2 wt:174 lbs    MEDICATIONS & ALLERGIES:    Patient reports the following regarding taking any blood thinners:   Plavix? no Aspirin? yes Coumadin? no Brilinta? no Xarelto? no Eliquis? no Pradaxa? no Savaysa? no Effient? no  Patient confirms/reports the following medications:  Current Outpatient Medications  Medication Sig Dispense Refill  . albuterol (PROVENTIL HFA;VENTOLIN HFA) 108 (90 Base) MCG/ACT inhaler Inhale 2 puffs into the lungs every 6 (six) hours as needed for wheezing or shortness of breath. 1 Inhaler 5  . aspirin EC 81 MG tablet Take 81 mg by mouth daily.    . Cholecalciferol (VITAMIN D) 50 MCG (2000 UT) CAPS Take by mouth daily.    . cyclobenzaprine (FLEXERIL) 10 MG tablet take 1 tablet twice a day (Patient taking differently: at bedtime. ) 60 tablet 1  . esomeprazole (NEXIUM) 40 MG capsule Take 20 mg by mouth daily at 12 noon. Takes twice daily.    . fexofenadine (ALLEGRA) 180 MG tablet Take 180 mg by mouth as needed for allergies or rhinitis.    Marland Kitchen irbesartan (AVAPRO) 150 MG tablet TK 1 T PO QD  90 tablet 0  . Melatonin 5 MG TABS Take 1 tablet by mouth at bedtime.    . rosuvastatin (CRESTOR) 10 MG tablet Take 1 tablet (10 mg total) by mouth daily. 90 tablet 0  . Trospium Chloride 60 MG CP24 Take 1 capsule by mouth daily.    . verapamil (VERELAN PM) 240 MG 24 hr capsule TAKE 1 CAPSULE (240 MG TOTAL) BY MOUTH AT BEDTIME. (Patient taking differently: Take 240 mg by mouth every morning. ) 90 capsule 1   No current facility-administered medications for this visit.    Patient confirms/reports the following allergies:  Allergies  Allergen Reactions  . Dexlansoprazole Diarrhea, Other (See Comments) and Nausea And Vomiting    N/V/D  . Oxycodone-Acetaminophen Other (See Comments)    extreme headache  . Percocet [Oxycodone-Acetaminophen]     Vomit and extreme headache  . Latex Rash    No orders of the defined types were placed in this encounter.   AUTHORIZATION INFORMATION Primary Insurance: [facility],  [place] #: [id number],  Group #: [id number] Pre-Cert / Auth required: Yes, approved online 12/07/2019- 123456 Pre-Cert / Josem Kaufmann #: [id number]  SCHEDULE INFORMATION: Procedure has been scheduled as follows:  Date: 12/07/2019, Time: 8:30 Location: [facility] with Dr. Oneida Alar  This Gastroenterology Pre-Precedure Review Form is being routed to the following provider(s): Walden Field, NP

## 2019-08-03 NOTE — Patient Instructions (Addendum)
[name]  07-18-1956 MRN: [id number]     Procedure Date: 02/07/2020 Time to register: 6:30 am Place to register: [facility] Procedure Time: 7:30 am Scheduled provider: Dr. Gala Romney    PREPARATION FOR COLONOSCOPY WITH SUPREP BOWEL PREP KIT  Note: Suprep Bowel Prep Kit is a split-dose (2day) regimen. Consumption of BOTH 6-ounce bottles is required for a complete prep.  Please notify us immediately if you are diabetic, take iron supplements, or if you are on Coumadin or any other blood thinners.  Please hold the following medications: n/a                                                                                                                                                2 DAY BEFORE PROCEDURE: DATE: 02/05/2020 DAY: Monday Begin clear liquid diet AFTER your lunch meal.  NO SOLID FOODS after this point.  1 DAY BEFORE PROCEDURE:  DATE: 02/06/2020   DAY: Tuesday Continue clear liquids the entire day - NO SOLID FOOD.    At 6:00pm: Complete steps 1 through 4 below, using ONE (1) 6-ounce bottle, before going to bed. Step 1:  Pour ONE (1) 6-ounce bottle of SUPREP liquid into the mixing container.  Step 2:  Add cool drinking water to the 16 ounce line on the container and mix.  Note: Dilute the solution concentrate as directed prior to use. Step 3:  DRINK ALL the liquid in the container. Step 4:  You MUST drink an additional two (2) or more 16 ounce containers of water over the next one (1) hour.   Continue clear liquids.  DAY OF PROCEDURE:   DATE: 02/07/2020   DAY: Wednesday If you take medications for your heart, blood pressure, or breathing, you may take these medications.   5 hours before your procedure at :  2:30 am Step 1:  Pour ONE (1) 6-ounce bottle of SUPREP liquid into the mixing container.  Step 2:  Add cool drinking water to the 16 ounce line on the container and mix.  Note: Dilute the solution concentrate as directed prior to use. Step 3:  DRINK ALL the  liquid in the container. Step 4:  You MUST drink an additional two (2) or more 16 ounce containers of water over the next one (1) hour. You MUST complete the final glass of water at least 3 hours before your colonoscopy. Nothing by mouth past 4:30 am  You may take your morning medications with sip of water unless we have instructed otherwise.    Please see below for Dietary Information.  CLEAR LIQUIDS INCLUDE:  Water Jello (NOT red in color)   Ice Popsicles (NOT red in color)   Tea (sugar ok, no milk/cream) Powdered fruit flavored drinks  Coffee (sugar ok, no milk/cream) Gatorade/ Lemonade/ Kool-Aid  (NOT red in color)   Juice: apple, white  grape, white cranberry Soft drinks  Clear bullion, consomme, broth (fat free beef/chicken/vegetable)  Carbonated beverages (any kind)  Strained chicken noodle soup Hard Candy   Remember: Clear liquids are liquids that will allow you to see your fingers on the other side of a clear glass. Be sure liquids are NOT red in color, and not cloudy, but CLEAR.  DO NOT EAT OR DRINK ANY OF THE FOLLOWING:  Dairy products of any kind   Cranberry juice Tomato juice / V8 juice   Grapefruit juice Orange juice     Red grape juice  Do not eat any solid foods, including such foods as: cereal, oatmeal, yogurt, fruits, vegetables, creamed soups, eggs, bread, crackers, pureed foods in a blender, etc.   HELPFUL HINTS FOR DRINKING PREP SOLUTION:   Make sure prep is extremely cold. Mix and refrigerate the the morning of the prep. You may also put in the freezer.   You may try mixing some Crystal Light or Country Time Lemonade if you prefer. Mix in small amounts; add more if necessary.  Try drinking through a straw  Rinse mouth with water or a mouthwash between glasses, to remove after-taste.  Try sipping on a cold beverage /ice/ popsicles between glasses of prep.  Place a piece of sugar-free hard candy in mouth between glasses.  If you become nauseated, try  consuming smaller amounts, or stretch out the time between glasses. Stop for 30-60 minutes, then slowly start back drinking.     OTHER INSTRUCTIONS  You will need a responsible adult at least 64 years of age to accompany you and drive you home. This person must remain in the waiting room during your procedure. The hospital will cancel your procedure if you do not have a responsible adult with you.   1. Wear loose fitting clothing that is easily removed. 2. Leave jewelry and other valuables at home.  3. Remove all body piercing jewelry and leave at home. 4. Total time from sign-in until discharge is approximately 2-3 hours. 5. You should go home directly after your procedure and rest. You can resume normal activities the day after your procedure. 6. The day of your procedure you should not:  Drive  Make legal decisions  Operate machinery  Drink alcohol  Return to work   You may call the office (Dept: [phone]) before 5:00pm, or page the doctor on call [phone]) after 5:00pm, for further instructions, if necessary.   Insurance Information YOU WILL NEED TO CHECK WITH YOUR INSURANCE COMPANY FOR THE BENEFITS OF COVERAGE YOU HAVE FOR THIS PROCEDURE.  UNFORTUNATELY, NOT ALL INSURANCE COMPANIES HAVE BENEFITS TO COVER ALL OR PART OF THESE TYPES OF PROCEDURES.  IT IS YOUR RESPONSIBILITY TO CHECK YOUR BENEFITS, HOWEVER, WE WILL BE GLAD TO ASSIST YOU WITH ANY CODES YOUR INSURANCE COMPANY MAY NEED.    PLEASE NOTE THAT MOST INSURANCE COMPANIES WILL NOT COVER A SCREENING COLONOSCOPY FOR PEOPLE UNDER THE AGE OF 50  IF YOU HAVE BCBS INSURANCE, YOU MAY HAVE BENEFITS FOR A SCREENING COLONOSCOPY BUT IF POLYPS ARE FOUND THE DIAGNOSIS WILL CHANGE AND THEN YOU MAY HAVE A DEDUCTIBLE THAT WILL NEED TO BE MET. SO PLEASE MAKE SURE YOU CHECK YOUR BENEFITS FOR A SCREENING COLONOSCOPY AS WELL AS A DIAGNOSTIC COLONOSCOPY.

## 2019-08-03 NOTE — Progress Notes (Signed)
Ok to schedule.  May need Zofran preprocedure or intraprocedure (FYI to endoscopy)

## 2019-08-04 NOTE — Addendum Note (Signed)
Addended by: Metro Kung on: 08/04/2019 08:06 AM   Modules accepted: Orders, SmartSet

## 2019-08-04 NOTE — Progress Notes (Signed)
Informed Day Surgery (in orders under special needs) that pt may need Zofran preprocedure or intraprocedure.

## 2019-08-16 ENCOUNTER — Ambulatory Visit (HOSPITAL_COMMUNITY)
Admission: RE | Admit: 2019-08-16 | Discharge: 2019-08-16 | Disposition: A | Discharge status: Home / Self Care | Payer: 59 | Source: Ambulatory Visit | Attending: Internal Medicine | Admitting: Internal Medicine

## 2019-08-16 ENCOUNTER — Other Ambulatory Visit: Payer: Self-pay

## 2019-08-16 DIAGNOSIS — Z1231 Encounter for screening mammogram for malignant neoplasm of breast: Secondary | ICD-10-CM | POA: Insufficient documentation

## 2019-08-31 ENCOUNTER — Encounter: Payer: Self-pay | Admitting: Cardiology

## 2019-08-31 ENCOUNTER — Ambulatory Visit (INDEPENDENT_AMBULATORY_CARE_PROVIDER_SITE_OTHER): Payer: 59 | Admitting: Cardiology

## 2019-08-31 ENCOUNTER — Other Ambulatory Visit: Payer: Self-pay

## 2019-08-31 VITALS — BP 139/78 | HR 88 | Temp 97.3°F | Ht 64.5 in | Wt 180.5 lb

## 2019-08-31 DIAGNOSIS — I4510 Unspecified right bundle-branch block: Secondary | ICD-10-CM | POA: Diagnosis not present

## 2019-08-31 DIAGNOSIS — E7800 Pure hypercholesterolemia, unspecified: Secondary | ICD-10-CM | POA: Diagnosis not present

## 2019-08-31 DIAGNOSIS — I2510 Atherosclerotic heart disease of native coronary artery without angina pectoris: Secondary | ICD-10-CM | POA: Diagnosis not present

## 2019-08-31 DIAGNOSIS — I10 Essential (primary) hypertension: Secondary | ICD-10-CM | POA: Diagnosis not present

## 2019-08-31 NOTE — Progress Notes (Signed)
Primary Physician:  Haywood Pao, MD   Patient ID: [name], female    DOB: 1956/07/17, 64 y.o.   MRN: [id number]  Subjective:    Chief Complaint  Patient presents with  . Tachycardia  . Follow-up    60mo   HPI: [name] is a 64y.o. female  with hypertension, tachycardia, hyperlipidemia, and prediabetes.   Patient is here on a 637-monthffice visit.  She was last seen virtually.  She previously was very active with walking 3 miles daily, but since bunionectomy in Fall of 2019 and continues to have pain in right foot he is only able to walk some days. She was previously able to walk 2 miles; however, due to right foot pain is only able to walk 30 mins to 1 hour.   She denies any chest pain.  Denies any PND, orthopnea, dizziness, synocpe, or symptoms suggestive of claudication or TIA. Does have leg swelling particularly at the end of the day.  Has chronic dyspnea on exertion with walking uphill that is stable.  She is followed by Dr. ByLamonte Sakaind had follow up CT scan in May for abnormalities noted on previous CT scan in Nov 2019. Has LAD atherosclerosis and aortic atherosclerosis present that was also noted in Nov 2019. She is tolerating medications well. Reports her hyperlipidemia is well controlled. Had labs performed this morning with PCP.  Past Medical History:  Diagnosis Date  . Allergy   . GERD (gastroesophageal reflux disease)   . Heartburn   . High blood pressure     Past Surgical History:  Procedure Laterality Date  . ABDOMINAL HYSTERECTOMY    . CHOLECYSTECTOMY    . COLONOSCOPY  2010   Dr. MaAviva Signsfour polyps in rectum and sigmoid colon (hyperplastic polyps)  . ESOPHAGOGASTRODUODENOSCOPY N/A 06/08/2014   SLF: 1. No obvious source for dyspepsia identified. 2. Medium sized hiatal hernia 3. Mild non-erosive gastritis.  . Marland KitchenSOPHAGOGASTRODUODENOSCOPY  2010   Dr. MaAviva Signshiatal hernia  . EUS  05/21/10   outlaw:hyperechoic liver mild consistent  with steatosis otherwise normal [place]. GALLBLADDER SURGERY  2004  . LAPAROSCOPIC NISSEN FUNDOPLICATION N/A [id number] Procedure: LAPAROSCOPIC NISSEN FUNDOPLICATION;  Surgeon: MaJamesetta SoMD;  Location: [facility];  Service: General;  Laterality: N/A;  . TONSILLECTOMY    . VIDEO BRONCHOSCOPY Bilateral 01/23/2014   Procedure: VIDEO BRONCHOSCOPY WITHOUT FLUORO;  Surgeon: RoCollene GobbleMD;  Location: [facility];  Service: Cardiopulmonary;  Laterality: Bilateral;    Social History   Socioeconomic History  . Marital status: Married    Spouse name: Not on file  . Number of children: 1  . Years of education: Not on file  . Highest education level: Not on file  Occupational History  . Not on file  Tobacco Use  . Smoking status: Former Smoker    Packs/day: 1.50    Years: 30.00    Pack years: 45.00    Types: Cigarettes    Quit date: 05/24/2008    Years since quitting: 11.2  . Smokeless tobacco: Never Used  . Tobacco comment: quit x 6 years  Substance and Sexual Activity  . Alcohol use: No  . Drug use: No  . Sexual activity: Not on file  Other Topics Concern  . Not on file  Social History Narrative  . Not on file   Social Determinants of Health   Financial Resource Strain:   . Difficulty of Paying Living  Expenses: Not on file  Food Insecurity:   . Worried About Charity fundraiser in the Last Year: Not on file  . Ran Out of Food in the Last Year: Not on file  Transportation Needs:   . Lack of Transportation (Medical): Not on file  . Lack of Transportation (Non-Medical): Not on file  Physical Activity:   . Days of Exercise per Week: Not on file  . Minutes of Exercise per Session: Not on file  Stress:   . Feeling of Stress : Not on file  Social Connections:   . Frequency of Communication with Friends and Family: Not on file  . Frequency of Social Gatherings with Friends and Family: Not on file  . Attends Religious Services: Not on file  . Active Member of Clubs or  Organizations: Not on file  . Attends Archivist Meetings: Not on file  . Marital Status: Not on file  Intimate Partner Violence:   . Fear of Current or Ex-Partner: Not on file  . Emotionally Abused: Not on file  . Physically Abused: Not on file  . Sexually Abused: Not on file    Review of Systems  Constitution: Negative for decreased appetite, malaise/fatigue, weight gain and weight loss.  Eyes: Negative for visual disturbance.  Cardiovascular: Positive for dyspnea on exertion and leg swelling (at the end of the day). Negative for chest pain, claudication, orthopnea, palpitations and syncope.  Respiratory: Negative for hemoptysis and wheezing.   Endocrine: Negative for cold intolerance and heat intolerance.  Hematologic/Lymphatic: Does not bruise/bleed easily.  Skin: Negative for nail changes.  Musculoskeletal: Positive for joint pain (right foot). Negative for muscle weakness and myalgias.  Gastrointestinal: Negative for abdominal pain, change in bowel habit, nausea and vomiting.  Neurological: Negative for difficulty with concentration, dizziness, focal weakness and headaches.  Psychiatric/Behavioral: Negative for altered mental status and suicidal ideas.  All other systems reviewed and are negative.     Objective:  Blood pressure 139/78, pulse 88, temperature (!) 97.3 F (36.3 C), height 5' 4.5" (1.638 m), weight 180 lb 8 oz (81.9 kg), SpO2 94 %. Body mass index is 30.5 kg/m.     Physical Exam  Constitutional: She is oriented to person, place, and time. Vital signs are normal. She appears well-developed and well-nourished.  HENT:  Head: Normocephalic and atraumatic.  Cardiovascular: Normal rate, regular rhythm, normal heart sounds and intact distal pulses.  No edema  Pulmonary/Chest: Effort normal and breath sounds normal. No accessory muscle usage. No respiratory distress.  Abdominal: Soft. Bowel sounds are normal.  Musculoskeletal:        General: Normal range  of motion.     Cervical back: Normal range of motion.  Neurological: She is alert and oriented to person, place, and time.  Skin: Skin is warm and dry.  Vitals reviewed.  Radiology:  CT Super D of chest 12/2018:  1. Significant regression of previously noted peribronchovascular micro and macronodularity, as above. 2. 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 3. Diffuse bronchial wall thickening with moderate centrilobular and paraseptal emphysema; imaging findings suggestive of underlying COPD. 4. Aortic atherosclerosis, in addition to left anterior descending coronary artery disease. Please note that although the presence of coronary artery calcium documents the presence of coronary artery disease, the severity of this disease and any potential stenosis cannot be assessed on this non-gated  CT examination. Assessment for potential risk factor modification, dietary therapy or pharmacologic therapy may be warranted, if clinically indicated.  CT of the chest 06/24/2018:  1. New clustered nodularity/tree-in-bud opacities in the medial basilar right upper lobe and peripheral left upper lobe with dominant new 6 mm peripheral left upper lobe nodule. While probably inflammatory related to progression of MAI given the below findings, non-contrast chest CT at 3-6 months is recommended in this high-risk patient with smoking related changes in the lungs. This recommendation follows the consensus statement: Guidelines for Management of Incidental Pulmonary Nodules Detected on CT Images: From the Fleischner Society 2017; Radiology 2017; 284:228-243. 2. 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 3. Moderate emphysema with mild diffuse bronchial wall thickening, suggesting COPD. 4. Small hiatal hernia. Fluid in the thoracic esophagus, suggesting esophageal dysmotility and/or gastroesophageal reflux. Nonspecific mild circumferential wall thickening in the lower thoracic esophagus, stable, most commonly due to reflux esophagitis, with Barrett's esophagus or neoplasm not excluded. 5. One vessel coronary atherosclerosis.  Laboratory examination:   02/21/2018: Creatinine 0.9, EGFR 63/76, potassium 4.5, normal. CBC normal. Cholesterol 143, triglycerides 76, HDL 56, LDL 72.  CMP Latest Ref Rng & Units 08/05/2014 08/04/2014 07/25/2014  Glucose 70 - 99 mg/dL 102(H) 116(H) 98  BUN 6 - 23 mg/dL 11 15 25(H)  Creatinine 0.50 - 1.10 mg/dL 0.80 0.89 0.95  Sodium 137 - 147 mEq/L 139 139 140  Potassium 3.7 - 5.3 mEq/L 3.9 4.4 4.2  Chloride 96 - 112 mEq/L 102 101 102  CO2 19 - 32 mEq/L _0 Calcium 8.4 - 10.5 mg/dL 8.6 8.8 9.2  Total Protein - - - -  Total Bilirubin - - - -  Alkaline Phos - - - -  AST - - - -  ALT - - - -   CBC Latest Ref Rng & Units 08/05/2014 08/04/2014 07/25/2014  WBC 4.0 - 10.5 K/uL 8.9 10.1 9.5  Hemoglobin 12.0 - 15.0 g/dL 13.2 13.2 15.2(H)  Hematocrit 36.0 - 46.0 % 39.3 39.7 45.8  Platelets 150 - 400 K/uL 175 234 232   Lipid Panel     Component Value Date/Time   CHOL  10/05/2007 0400    190        ATP III CLASSIFICATION:  <200     mg/dL   Desirable  200-239  mg/dL   Borderline High  >=240    mg/dL   High   TRIG 114 10/05/2007 0400   HDL 63 10/05/2007 0400   CHOLHDL 3.0 10/05/2007 0400   VLDL 23 10/05/2007 0400   LDLCALC (H) 10/05/2007 0400    104        Total Cholesterol/HDL:CHD Risk Coronary Heart Disease Risk Table                     Men   Women  1/2 Average Risk   3.4   3.3   HEMOGLOBIN A1C No results found for: HGBA1C, MPG TSH No results for input(s): TSH in the last 8760 hours.  PRN Meds:. There are no  discontinued medications. Current Meds  Medication Sig  . albuterol (PROVENTIL HFA;VENTOLIN HFA) 108 (90 Base) MCG/ACT inhaler Inhale 2 puffs into the lungs every 6 (six) hours as needed for wheezing or shortness of breath.  Marland Kitchen aspirin EC 81 MG tablet Take 81 mg by mouth daily.  . Cholecalciferol (VITAMIN D) 50 MCG (2000 UT)  CAPS Take by mouth daily.  . cyclobenzaprine (FLEXERIL) 10 MG tablet take 1 tablet twice a day (Patient taking differently: at bedtime. )  . esomeprazole (NEXIUM) 40 MG capsule Take 40 mg by mouth 2 (two) times daily. Takes twice daily.  . fexofenadine (ALLEGRA) 180 MG tablet Take 180 mg by mouth as needed for allergies or rhinitis.  Marland Kitchen irbesartan (AVAPRO) 150 MG tablet TK 1 T PO QD  . Melatonin 5 MG TABS Take 1 tablet by mouth at bedtime.  . rosuvastatin (CRESTOR) 10 MG tablet Take 1 tablet (10 mg total) by mouth daily.  . Trospium Chloride 60 MG CP24 Take 1 capsule by mouth daily.  . verapamil (VERELAN PM) 240 MG 24 hr capsule TAKE 1 CAPSULE (240 MG TOTAL) BY MOUTH AT BEDTIME. (Patient taking differently: Take 240 mg by mouth every morning. )    Cardiac Studies:   Nuclear stress test [08/06/2016]: 1. The resting electrocardiogram demonstrated normal sinus rhythm, RBBB, no resting arrhythmias and normal rest repolarization. The stress electrocardiogram was normal. Patient exercised on Bruce protocol for 7:00 minutes and achieved 7.05 METS. Stress test terminated due to dyspnea and 91% MPHR achieved (Target HR >85%). Hypertensive Bp response (200/98 mm Hg peak). 2. Myocardial perfusion imaging is normal. Overall left ventricular systolic function was normal without regional wall motion abnormalities. The left ventricular ejection fraction was 71%.  Echocardiogram [07/22/2016]: 1. Left ventricle cavity is normal in size. Hyperdynamic global wall motion. Visual EF is >70%. Doppler evidence of grade I (impaired) diastolic dysfunction, Calculated EF 67%. 2. Mild (Grade I)  mitral regurgitation. 3. Mild tricuspid regurgitation. Can't calculate PA pressure accuratrely due to insignificant TR signal.  Assessment:     ICD-10-CM   1. Essential hypertension  I10 EKG 12-Lead  2. Right bundle branch block  I45.10   3. Coronary artery calcification seen on CT scan  I25.10   4. Pure hypercholesterolemia  E78.00     EKG 09/16/2018: Sinus rhythm at a rate of 84 bpm, rightward axis, RBBB, no evidence of ischemia. Compared to EKG 09/16/2017, complete RBBB  Recommendations:   Patient is doing well without any complaints today.  Blood pressure remains well controlled we will continue with present medications.  No changes are noted to EKG or physical exam.  Known right bundle branch block.  She has not had any symptoms of angina or clinical evidence of heart failure.  I have encouraged her to try to be as active as she can, she does continue to deal with foot pain from previous bunionectomy.  She is to see her PCP in June and will have labs performed at that time to follow-up on hyperlipidemia.  She does have coronary artery calcification on previous CT scans that has been stable for the last few years.  I would recommend aggressive lipid control with LDL less than 70.  I have advised her to contact me if her LDL is not at goal and will make further adjustments to her medications.  She previously has had sinus tachycardia, but is not noted today.  Continue with verapamil.  I will plan to see her back in 1 year, but encouraged her to contact me sooner if needed.  Miquel Dunn, MSN, APRN, FNP-C [facility]. [place] Office: [phone] Fax: [phone]

## 2019-11-20 ENCOUNTER — Telehealth: Payer: Self-pay | Admitting: Gastroenterology

## 2019-11-20 ENCOUNTER — Other Ambulatory Visit: Payer: Self-pay | Admitting: *Deleted

## 2019-11-20 DIAGNOSIS — Z1211 Encounter for screening for malignant neoplasm of colon: Secondary | ICD-10-CM

## 2019-11-20 NOTE — Telephone Encounter (Signed)
Please call patient, she needs to reschedule her procedure  

## 2019-11-20 NOTE — Telephone Encounter (Signed)
Called pt and she requested to reschedule her procedure to June.  Pt is aware that [facility] would not be with [place] then.  She agreed to have her procedure done with [facility].  Pt rescheduled to 02/07/2020.  Pt is aware that I will mail out new prep instructions and Covid screening information.  Pt voiced understanding.

## 2019-11-24 ENCOUNTER — Other Ambulatory Visit: Payer: Self-pay | Admitting: Cardiology

## 2019-12-05 ENCOUNTER — Other Ambulatory Visit (HOSPITAL_COMMUNITY): Payer: 59

## 2019-12-07 ENCOUNTER — Ambulatory Visit (HOSPITAL_COMMUNITY): Admit: 2019-12-07 | Payer: 59 | Admitting: Gastroenterology

## 2019-12-07 ENCOUNTER — Encounter (HOSPITAL_COMMUNITY): Payer: Self-pay

## 2019-12-07 SURGERY — COLONOSCOPY
Anesthesia: Moderate Sedation

## 2020-02-02 ENCOUNTER — Telehealth: Payer: Self-pay | Admitting: *Deleted

## 2020-02-02 NOTE — Telephone Encounter (Signed)
Pt called in and requested to go over prep instructions by phone.  Reviewed instructions and answered all questions.  Pt stated that she did not receive updated prep instructions.  I apologized and told her that we mailed them out but I wasn't sure what happened.  Asked pt if she would like me to fax over prep instructions to her.  She requested that I fax them to [phone].  Pt called back and informed us that she did receive instructions and that they came across to her 4 times.

## 2020-02-05 ENCOUNTER — Other Ambulatory Visit: Payer: Self-pay

## 2020-02-05 ENCOUNTER — Other Ambulatory Visit (HOSPITAL_COMMUNITY)
Admission: RE | Admit: 2020-02-05 | Discharge: 2020-02-05 | Disposition: A | Discharge status: Home / Self Care | Payer: 59 | Source: Ambulatory Visit | Attending: Internal Medicine | Admitting: Internal Medicine

## 2020-02-05 DIAGNOSIS — Z01812 Encounter for preprocedural laboratory examination: Secondary | ICD-10-CM | POA: Diagnosis not present

## 2020-02-05 DIAGNOSIS — Z20822 Contact with and (suspected) exposure to covid-19: Secondary | ICD-10-CM | POA: Insufficient documentation

## 2020-02-06 LAB — SARS CORONAVIRUS 2 (TAT 6-24 HRS): SARS Coronavirus 2: NEGATIVE

## 2020-02-07 ENCOUNTER — Other Ambulatory Visit: Payer: Self-pay

## 2020-02-07 ENCOUNTER — Ambulatory Visit (HOSPITAL_COMMUNITY)
Admission: RE | Admit: 2020-02-07 | Discharge: 2020-02-07 | Disposition: A | Discharge status: Home / Self Care | Payer: 59 | Attending: Internal Medicine | Admitting: Internal Medicine

## 2020-02-07 ENCOUNTER — Encounter (HOSPITAL_COMMUNITY)
Admission: RE | Disposition: A | Discharge status: Home / Self Care | Payer: Self-pay | Source: Home / Self Care | Attending: Internal Medicine

## 2020-02-07 ENCOUNTER — Encounter (HOSPITAL_COMMUNITY): Payer: Self-pay | Admitting: Internal Medicine

## 2020-02-07 DIAGNOSIS — I10 Essential (primary) hypertension: Secondary | ICD-10-CM | POA: Diagnosis not present

## 2020-02-07 DIAGNOSIS — K5730 Diverticulosis of large intestine without perforation or abscess without bleeding: Secondary | ICD-10-CM | POA: Insufficient documentation

## 2020-02-07 DIAGNOSIS — Z7982 Long term (current) use of aspirin: Secondary | ICD-10-CM | POA: Diagnosis not present

## 2020-02-07 DIAGNOSIS — D124 Benign neoplasm of descending colon: Secondary | ICD-10-CM | POA: Insufficient documentation

## 2020-02-07 DIAGNOSIS — D123 Benign neoplasm of transverse colon: Secondary | ICD-10-CM | POA: Insufficient documentation

## 2020-02-07 DIAGNOSIS — K635 Polyp of colon: Secondary | ICD-10-CM | POA: Diagnosis not present

## 2020-02-07 DIAGNOSIS — Z8719 Personal history of other diseases of the digestive system: Secondary | ICD-10-CM | POA: Insufficient documentation

## 2020-02-07 DIAGNOSIS — K219 Gastro-esophageal reflux disease without esophagitis: Secondary | ICD-10-CM | POA: Insufficient documentation

## 2020-02-07 DIAGNOSIS — Z1211 Encounter for screening for malignant neoplasm of colon: Secondary | ICD-10-CM | POA: Insufficient documentation

## 2020-02-07 DIAGNOSIS — Z79899 Other long term (current) drug therapy: Secondary | ICD-10-CM | POA: Diagnosis not present

## 2020-02-07 HISTORY — PX: COLONOSCOPY: SHX5424

## 2020-02-07 HISTORY — PX: HEMOSTASIS CLIP PLACEMENT: SHX6857

## 2020-02-07 HISTORY — PX: POLYPECTOMY: SHX5525

## 2020-02-07 SURGERY — COLONOSCOPY
Anesthesia: Moderate Sedation

## 2020-02-07 MED ORDER — MEPERIDINE HCL 100 MG/ML IJ SOLN
INTRAMUSCULAR | Status: DC | PRN
  Administered 2020-02-07: 15 mg
  Administered 2020-02-07: 25 mg

## 2020-02-07 MED ORDER — MIDAZOLAM HCL 5 MG/5ML IJ SOLN
INTRAMUSCULAR | Status: DC | PRN
  Administered 2020-02-07 (×2): 1 mg via INTRAVENOUS
  Administered 2020-02-07 (×2): 2 mg via INTRAVENOUS

## 2020-02-07 MED ORDER — SODIUM CHLORIDE 0.9 % IV SOLN: INTRAVENOUS | Status: DC

## 2020-02-07 MED ORDER — MIDAZOLAM HCL 5 MG/5ML IJ SOLN
INTRAMUSCULAR | Status: AC
  Filled 2020-02-07: qty 10

## 2020-02-07 MED ORDER — ONDANSETRON HCL 4 MG/2ML IJ SOLN
INTRAMUSCULAR | Status: AC
  Filled 2020-02-07: qty 2

## 2020-02-07 MED ORDER — MEPERIDINE HCL 50 MG/ML IJ SOLN
INTRAMUSCULAR | Status: AC
  Filled 2020-02-07: qty 1

## 2020-02-07 MED ORDER — ONDANSETRON HCL 4 MG/2ML IJ SOLN
INTRAMUSCULAR | Status: DC | PRN
  Administered 2020-02-07: 4 mg via INTRAVENOUS

## 2020-02-07 NOTE — H&P (Signed)
@LOGO @   Primary Care Physician:  Tisovec, Fransico Him, MD Primary Gastroenterologist:  Dr. Gala Romney  Pre-Procedure History & Physical: HPI:  [name] is a 64 y.o. female is here for a screening colonoscopy.  Hyperplastic polyps on 2010 colonoscopy.  No bowel symptoms currently.  No family history colon cancer/precancerous polyps.  Past Medical History:  Diagnosis Date  . Allergy   . GERD (gastroesophageal reflux disease)   . Heartburn   . High blood pressure     Past Surgical History:  Procedure Laterality Date  . ABDOMINAL HYSTERECTOMY    . CHOLECYSTECTOMY    . COLONOSCOPY  2010   Dr. Aviva Signs: four polyps in rectum and sigmoid colon (hyperplastic polyps)  . ESOPHAGOGASTRODUODENOSCOPY N/A 06/08/2014   SLF: 1. No obvious source for dyspepsia identified. 2. Medium sized hiatal hernia 3. Mild non-erosive gastritis.  Marland Kitchen ESOPHAGOGASTRODUODENOSCOPY  2010   Dr. Aviva Signs: hiatal hernia  . EUS  05/21/10   outlaw:hyperechoic liver mild consistent with steatosis otherwise normal [place]  . GALLBLADDER SURGERY  2004  . LAPAROSCOPIC NISSEN FUNDOPLICATION N/A 51/70/0174   Procedure: LAPAROSCOPIC NISSEN FUNDOPLICATION;  Surgeon: Jamesetta So, MD;  Location: [facility];  Service: General;  Laterality: N/A;  . TONSILLECTOMY    . VIDEO BRONCHOSCOPY Bilateral 01/23/2014   Procedure: VIDEO BRONCHOSCOPY WITHOUT FLUORO;  Surgeon: Collene Gobble, MD;  Location: [facility];  Service: Cardiopulmonary;  Laterality: Bilateral;    Prior to Admission medications   Medication Sig Start Date End Date Taking? Authorizing Provider  albuterol (PROVENTIL HFA;VENTOLIN HFA) 108 (90 Base) MCG/ACT inhaler Inhale 2 puffs into the lungs every 6 (six) hours as needed for wheezing or shortness of breath. 07/23/17  Yes Collene Gobble, MD  aspirin EC 81 MG tablet Take 81 mg by mouth daily.   Yes [provider]  Carboxymethylcellulose Sodium (ARTIFICIAL TEARS OP) Apply 1 drop to eye in the morning and at  bedtime.   Yes [provider]  Cholecalciferol (VITAMIN D) 50 MCG (2000 UT) CAPS Take 2,000 Units by mouth daily.    Yes [provider]  cyclobenzaprine (FLEXERIL) 10 MG tablet take 1 tablet twice a day Patient taking differently: Take 10 mg by mouth at bedtime.  08/23/15  Yes Annitta Needs, NP  Esomeprazole Magnesium 20 MG TBEC Take 20 mg by mouth 2 (two) times daily.    Yes [provider]  irbesartan (AVAPRO) 150 MG tablet TK 1 T PO QD Patient taking differently: Take 150 mg by mouth daily.  02/07/19  Yes Miquel Dunn, NP  Melatonin 5 MG TABS Take 5 mg by mouth at bedtime.    Yes [provider]  rosuvastatin (CRESTOR) 10 MG tablet Take 1 tablet (10 mg total) by mouth daily. 12/27/18  Yes Miquel Dunn, NP  Trospium Chloride 60 MG CP24 Take 60 mg by mouth daily.    Yes [provider]  verapamil (VERELAN PM) 240 MG 24 hr capsule TAKE 1 CAPSULE (240 MG TOTAL) BY MOUTH AT BEDTIME. 11/27/19  Yes Miquel Dunn, NP    Allergies as of 11/20/2019 - Review Complete 08/31/2019  Allergen Reaction Noted  . Dexlansoprazole Diarrhea, Other (See Comments), and Nausea And Vomiting 04/13/2014  . Oxycodone-acetaminophen Other (See Comments) 02/11/2015  . Percocet [oxycodone-acetaminophen]    . Latex Rash 04/30/2010    Family History  Problem Relation Age of Onset  . Heart disease Maternal Grandfather   . Heart disease Paternal Grandmother   . Cancer  Mother        lymphoma  . Allergies Mother   . Allergies Brother   . Colon cancer Neg Hx     Social History   Socioeconomic History  . Marital status: Married    Spouse name: Not on file  . Number of children: 1  . Years of education: Not on file  . Highest education level: Not on file  Occupational History  . Not on file  Tobacco Use  . Smoking status: Former Smoker    Packs/day: 1.50    Years: 30.00    Pack years: 45.00    Types: Cigarettes    Quit date: 05/24/2008     Years since quitting: 11.7  . Smokeless tobacco: Never Used  . Tobacco comment: quit x 6 years  Vaping Use  . Vaping Use: Never used  Substance and Sexual Activity  . Alcohol use: No  . Drug use: No  . Sexual activity: Not on file  Other Topics Concern  . Not on file  Social History Narrative  . Not on file   Social Determinants of Health   Financial Resource Strain:   . Difficulty of Paying Living Expenses:   Food Insecurity:   . Worried About Charity fundraiser in the Last Year:   . Arboriculturist in the Last Year:   Transportation Needs:   . Film/video editor (Medical):   Marland Kitchen Lack of Transportation (Non-Medical):   Physical Activity:   . Days of Exercise per Week:   . Minutes of Exercise per Session:   Stress:   . Feeling of Stress :   Social Connections:   . Frequency of Communication with Friends and Family:   . Frequency of Social Gatherings with Friends and Family:   . Attends Religious Services:   . Active Member of Clubs or Organizations:   . Attends Archivist Meetings:   Marland Kitchen Marital Status:   Intimate Partner Violence:   . Fear of Current or Ex-Partner:   . Emotionally Abused:   Marland Kitchen Physically Abused:   . Sexually Abused:     Review of Systems: See HPI, otherwise negative ROS  Physical Exam: BP (!) 154/63   Pulse 84   Temp (!) 97.4 F (36.3 C) (Oral)   Resp 16   Ht 5' 4.5" (1.638 m)   SpO2 96%   BMI 30.50 kg/m  General:   Alert,  Well-developed, well-nourished, pleasant and cooperative in NAD Lungs:  Clear throughout to auscultation.   No wheezes, crackles, or rhonchi. No acute distress. Heart:  Regular rate and rhythm; no murmurs, clicks, rubs,  or gallops. Abdomen:  Soft, nontender and nondistended. No masses, hepatosplenomegaly or hernias noted. Normal bowel sounds, without guarding, and without rebound.    Impression/Plan: [name] is now here to undergo a screening colonoscopy.  Average rescreening examination.  Risks,  benefits, limitations, imponderables and alternatives regarding colonoscopy have been reviewed with the patient. Questions have been answered. All parties agreeable.     Notice:  This dictation was prepared with Dragon dictation along with smaller phrase technology. Any transcriptional errors that result from this process are unintentional and may not be corrected upon review.

## 2020-02-07 NOTE — Op Note (Signed)
[facility]) Patient Name: [name] Procedure Date: 02/07/2020 7:20 AM MRN: [id number] Date of Birth: [birth year] Attending MD: Norvel Richards , MD CSN: [id number] Age: 64 Admit Type: Outpatient Procedure:                Colonoscopy Indications:              Screening for colorectal malignant neoplasm Providers:                Norvel Richards, MD, Crystal Page, Nelma Rothman,                            Technician Referring MD:              Medicines:                Midazolam 6 mg IV, Meperidine 40 mg IV Complications:            No immediate complications. Estimated Blood Loss:     Estimated blood loss was minimal. Procedure:                Pre-Anesthesia Assessment:                           - Prior to the procedure, a History and Physical                            was performed, and patient medications and                            allergies were reviewed. The patient's tolerance of                            previous anesthesia was also reviewed. The risks                            and benefits of the procedure and the sedation                            options and risks were discussed with the patient.                            All questions were answered, and informed consent                            was obtained. Prior Anticoagulants: The patient has                            taken no previous anticoagulant or antiplatelet                            agents. ASA Grade Assessment: II - A patient with                            mild systemic disease. After reviewing the risks  and benefits, the patient was deemed in                            satisfactory condition to undergo the procedure.                           After obtaining informed consent, the colonoscope                            was passed under direct vision. Throughout the                            procedure, the patient's blood pressure, pulse, and                             oxygen saturations were monitored continuously. The                            CF-HQ190L ([id number]) scope was introduced through                            the anus and advanced to the the cecum, identified                            by appendiceal orifice and ileocecal valve. The                            colonoscopy was performed without difficulty. The                            patient tolerated the procedure well. The quality                            of the bowel preparation was adequate. Scope In: 3:54:65 AM Scope Out: 8:09:17 AM Scope Withdrawal Time: 0 hours 15 minutes 19 seconds  Total Procedure Duration: 0 hours 23 minutes 5 seconds  Findings:      The perianal and digital rectal examinations were normal.      A 8 mm polyp was found in the hepatic flexure. The polyp was       semi-pedunculated. The polyp was removed with a cold snare. Resection       and retrieval were complete. Estimated blood loss was minimal.      A 15 mm polyp was found in the descending colon. The polyp was       pedunculated. The polyp was removed with a hot snare. Resection and       retrieval were complete. Estimated blood loss: none. Polypectomy stalk       clipped x1 to ensure good hemostasis      A few medium-mouthed diverticula were found in the descending colon.      The exam was otherwise without abnormality on direct and retroflexion       views. Impression:               - One 8 mm polyp at the hepatic flexure, removed  with a cold snare. Resected and retrieved.                           - One 15 mm polyp in the descending colon, removed                            with a hot snare. Resected and retrieved.                           - Diverticulosis in the descending colon.                           - The examination was otherwise normal on direct                            and retroflexion views. That is post clip placement. Moderate Sedation:      Moderate  (conscious) sedation was administered by the endoscopy nurse       and supervised by the endoscopist. The following parameters were       monitored: oxygen saturation, heart rate, blood pressure, respiratory       rate, EKG, adequacy of pulmonary ventilation, and response to care.       Total physician intraservice time was 30 minutes. Recommendation:           - Patient has a contact number available for                            emergencies. The signs and symptoms of potential                            delayed complications were discussed with the                            patient. Return to normal activities tomorrow.                            Written discharge instructions were provided to the                            patient.                           - Resume previous diet.                           - Continue present medications.                           - Await pathology results. No MRI until clip gone                           - Repeat colonoscopy date to be determined after                            pending pathology results are reviewed for  surveillance based on pathology results.                           - Return to GI office (date not yet determined). Procedure Code(s):        --- Professional ---                           [phone], Colonoscopy, flexible; with removal of                            tumor(s), polyp(s), or other lesion(s) by snare                            technique                           99153, Moderate sedation; each additional 15                            minutes intraservice time                           G0500, Moderate sedation services provided by the                            same physician or other qualified health care                            professional performing a gastrointestinal                            endoscopic service that sedation supports,                            requiring the presence of an  independent trained                            observer to assist in the monitoring of the                            patient's level of consciousness and physiological                            status; initial 15 minutes of intra-service time;                            patient age 6 years or older (additional time may                            be reported with [phone], as appropriate) Diagnosis Code(s):        --- Professional ---                           Z12.11, Encounter for screening for malignant  neoplasm of colon                           K63.5, Polyp of colon                           K57.30, Diverticulosis of large intestine without                            perforation or abscess without bleeding CPT copyright 2019 [employer]. All rights reserved. The codes documented in this report are preliminary and upon coder review may  be revised to meet current compliance requirements. Cristopher Estimable. Ezriel Boffa, MD Norvel Richards, MD 02/07/2020 8:18:49 AM This report has been signed electronically. Number of Addenda: 0

## 2020-02-07 NOTE — Discharge Instructions (Signed)
Colonoscopy Discharge Instructions  Read the instructions outlined below and refer to this sheet in the next few weeks. These discharge instructions provide you with general information on caring for yourself after you leave the hospital. Your doctor may also give you specific instructions. While your treatment has been planned according to the most current medical practices available, unavoidable complications occasionally occur. If you have any problems or questions after discharge, call Dr. Gala Romney at [phone]. ACTIVITY  You may resume your regular activity, but move at a slower pace for the next 24 hours.   Take frequent rest periods for the next 24 hours.   Walking will help get rid of the air and reduce the bloated feeling in your belly (abdomen).   No driving for 24 hours (because of the medicine (anesthesia) used during the test).    Do not sign any important legal documents or operate any machinery for 24 hours (because of the anesthesia used during the test).  NUTRITION  Drink plenty of fluids.   You may resume your normal diet as instructed by your doctor.   Begin with a light meal and progress to your normal diet. Heavy or fried foods are harder to digest and may make you feel sick to your stomach (nauseated).   Avoid alcoholic beverages for 24 hours or as instructed.  MEDICATIONS  You may resume your normal medications unless your doctor tells you otherwise.  WHAT YOU CAN EXPECT TODAY  Some feelings of bloating in the abdomen.   Passage of more gas than usual.   Spotting of blood in your stool or on the toilet paper.  IF YOU HAD POLYPS REMOVED DURING THE COLONOSCOPY:  No aspirin products for 7 days or as instructed.   No alcohol for 7 days or as instructed.   Eat a soft diet for the next 24 hours.  FINDING OUT THE RESULTS OF YOUR TEST Not all test results are available during your visit. If your test results are not back during the visit, make an appointment  with your caregiver to find out the results. Do not assume everything is normal if you have not heard from your caregiver or the medical facility. It is important for you to follow up on all of your test results.  SEEK IMMEDIATE MEDICAL ATTENTION IF:  You have more than a spotting of blood in your stool.   Your belly is swollen (abdominal distention).   You are nauseated or vomiting.   You have a temperature over 101.   You have abdominal pain or discomfort that is severe or gets worse throughout the day.    Colon polyp and diverticulosis information provided  No future MRI until clip is gone  Further recommendations to follow pending review of pathology report  At patient request, I called Cornelia Copa at [phone]  And reviewed results    Colon Polyps  Polyps are tissue growths inside the body. Polyps can grow in many places, including the large intestine (colon). A polyp may be a round bump or a mushroom-shaped growth. You could have one polyp or several. Most colon polyps are noncancerous (benign). However, some colon polyps can become cancerous over time. Finding and removing the polyps early can help prevent this. What are the causes? The exact cause of colon polyps is not known. What increases the risk? You are more likely to develop this condition if you:  Have a family history of colon cancer or colon polyps.  Are older than 50 or older than  80 if you are African American.  Have inflammatory bowel disease, such as ulcerative colitis or Crohn's disease.  Have certain hereditary conditions, such as: ? Familial adenomatous polyposis. ? Lynch syndrome. ? Turcot syndrome. ? Peutz-Jeghers syndrome.  Are overweight.  Smoke cigarettes.  Do not get enough exercise.  Drink too much alcohol.  Eat a diet that is high in fat and red meat and low in fiber.  Had childhood cancer that was treated with abdominal radiation. What are the signs or symptoms? Most polyps do  not cause symptoms. If you have symptoms, they may include:  Blood coming from your rectum when having a bowel movement.  Blood in your stool. The stool may look dark red or black.  Abdominal pain.  A change in bowel habits, such as constipation or diarrhea. How is this diagnosed? This condition is diagnosed with a colonoscopy. This is a procedure in which a lighted, flexible scope is inserted into the anus and then passed into the colon to examine the area. Polyps are sometimes found when a colonoscopy is done as part of routine cancer screening tests. How is this treated? Treatment for this condition involves removing any polyps that are found. Most polyps can be removed during a colonoscopy. Those polyps will then be tested for cancer. Additional treatment may be needed depending on the results of testing. Follow these instructions at home: Lifestyle  Maintain a healthy weight, or lose weight if recommended by your health care provider.  Exercise every day or as told by your health care provider.  Do not use any products that contain nicotine or tobacco, such as cigarettes and e-cigarettes. If you need help quitting, ask your health care provider.  If you drink alcohol, limit how much you have: ? 0-1 drink a day for women. ? 0-2 drinks a day for men.  Be aware of how much alcohol is in your drink. In the [place] one drink equals one 12 oz bottle of beer (355 mL), one 5 oz glass of wine (148 mL), or one 1 oz shot of hard liquor (44 mL). Eating and drinking   Eat foods that are high in fiber, such as fruits, vegetables, and whole grains.  Eat foods that are high in calcium and vitamin D, such as milk, cheese, yogurt, eggs, liver, fish, and broccoli.  Limit foods that are high in fat, such as fried foods and desserts.  Limit the amount of red meat and processed meat you eat, such as hot dogs, sausage, bacon, and lunch meats. General instructions  Keep all follow-up visits as  told by your health care provider. This is important. ? This includes having regularly scheduled colonoscopies. ? Talk to your health care provider about when you need a colonoscopy. Contact a health care provider if:  You have new or worsening bleeding during a bowel movement.  You have new or increased blood in your stool.  You have a change in bowel habits.  You lose weight for no known reason. Summary  Polyps are tissue growths inside the body. Polyps can grow in many places, including the colon.  Most colon polyps are noncancerous (benign), but some can become cancerous over time.  This condition is diagnosed with a colonoscopy.  Treatment for this condition involves removing any polyps that are found. Most polyps can be removed during a colonoscopy. This information is not intended to replace advice given to you by your health care provider. Make sure you discuss any questions you have with  your health care provider. Document Revised: 11/25/2017 Document Reviewed: 11/25/2017 Elsevier Patient Education  [place].    Diverticulosis  Diverticulosis is a condition that develops when small pouches (diverticula) form in the wall of the large intestine (colon). The colon is where water is absorbed and stool (feces) is formed. The pouches form when the inside layer of the colon pushes through weak spots in the outer layers of the colon. You may have a few pouches or many of them. The pouches usually do not cause problems unless they become inflamed or infected. When this happens, the condition is called diverticulitis. What are the causes? The cause of this condition is not known. What increases the risk? The following factors may make you more likely to develop this condition:  Being older than age 5. Your risk for this condition increases with age. Diverticulosis is rare among people younger than age 60. By age 25, many people have it.  Eating a low-fiber  diet.  Having frequent constipation.  Being overweight.  Not getting enough exercise.  Smoking.  Taking over-the-counter pain medicines, like aspirin and ibuprofen.  Having a family history of diverticulosis. What are the signs or symptoms? In most people, there are no symptoms of this condition. If you do have symptoms, they may include:  Bloating.  Cramps in the abdomen.  Constipation or diarrhea.  Pain in the lower left side of the abdomen. How is this diagnosed? Because diverticulosis usually has no symptoms, it is most often diagnosed during an exam for other colon problems. The condition may be diagnosed by:  Using a flexible scope to examine the colon (colonoscopy).  Taking an X-ray of the colon after dye has been put into the colon (barium enema).  Having a CT scan. How is this treated? You may not need treatment for this condition. Your health care provider may recommend treatment to prevent problems. You may need treatment if you have symptoms or if you previously had diverticulitis. Treatment may include:  Eating a high-fiber diet.  Taking a fiber supplement.  Taking a live bacteria supplement (probiotic).  Taking medicine to relax your colon. Follow these instructions at home: Medicines  Take over-the-counter and prescription medicines only as told by your health care provider.  If told by your health care provider, take a fiber supplement or probiotic. Constipation prevention Your condition may cause constipation. To prevent or treat constipation, you may need to:  Drink enough fluid to keep your urine pale yellow.  Take over-the-counter or prescription medicines.  Eat foods that are high in fiber, such as beans, whole grains, and fresh fruits and vegetables.  Limit foods that are high in fat and processed sugars, such as fried or sweet foods.  General instructions  Try not to strain when you have a bowel movement.  Keep all follow-up visits  as told by your health care provider. This is important. Contact a health care provider if you:  Have pain in your abdomen.  Have bloating.  Have cramps.  Have not had a bowel movement in 3 days. Get help right away if:  Your pain gets worse.  Your bloating becomes very bad.  You have a fever or chills, and your symptoms suddenly get worse.  You vomit.  You have bowel movements that are bloody or black.  You have bleeding from your rectum. Summary  Diverticulosis is a condition that develops when small pouches (diverticula) form in the wall of the large intestine (colon).  You may  have a few pouches or many of them.  This condition is most often diagnosed during an exam for other colon problems.  Treatment may include increasing the fiber in your diet, taking supplements, or taking medicines. This information is not intended to replace advice given to you by your health care provider. Make sure you discuss any questions you have with your health care provider. Document Revised: 03/09/2019 Document Reviewed: 03/09/2019 Elsevier Patient Education  [place].

## 2020-02-08 ENCOUNTER — Encounter: Payer: Self-pay | Admitting: Internal Medicine

## 2020-02-08 LAB — SURGICAL PATHOLOGY

## 2020-02-09 ENCOUNTER — Encounter (HOSPITAL_COMMUNITY): Payer: Self-pay | Admitting: Internal Medicine

## 2020-05-01 ENCOUNTER — Encounter: Payer: Self-pay | Admitting: Emergency Medicine

## 2020-05-01 ENCOUNTER — Ambulatory Visit: Payer: 59 | Admitting: Emergency Medicine

## 2020-05-01 ENCOUNTER — Other Ambulatory Visit: Payer: Self-pay

## 2020-05-01 DIAGNOSIS — R05 Cough: Secondary | ICD-10-CM | POA: Diagnosis not present

## 2020-05-01 DIAGNOSIS — R058 Other specified cough: Secondary | ICD-10-CM

## 2020-05-01 DIAGNOSIS — J4520 Mild intermittent asthma, uncomplicated: Secondary | ICD-10-CM

## 2020-05-01 DIAGNOSIS — K219 Gastro-esophageal reflux disease without esophagitis: Secondary | ICD-10-CM | POA: Diagnosis not present

## 2020-05-01 MED ORDER — ALBUTEROL SULFATE HFA 108 (90 BASE) MCG/ACT IN AERS: 2.0000 | INHALATION_SPRAY | Freq: Four times a day (QID) | RESPIRATORY_TRACT | 11 refills | Status: DC | PRN

## 2020-05-01 NOTE — Patient Instructions (Signed)
Keep your albuterol available use 2 puffs when needed for shortness of breath, chest tightness, wheezing. We will hold off on ordering a CT scan of the chest for now.  We can talk about the timing of any repeat imaging at your next office visit.  If you develop any breathing changes, new respiratory symptoms then please call because we may decide to repeat your imaging sooner. Continue Nexium as you have been taking it Follow with Dr. Lamonte Sakai in 12 months or sooner if you have any problems.

## 2020-05-01 NOTE — Assessment & Plan Note (Signed)
Has not required a maintenance bronchodilator regimen.  Continue albuterol as needed

## 2020-05-01 NOTE — Progress Notes (Signed)
Subjective:   Patient ID: [name], female    DOB: 14-Oct-1955    MRN: [id number]  Brief patient profile:   Spirometry 2012 at [facility] > AFL especially small airways. FOB was performed 01/23/14 > all cx data negative.   All auto-immune labs negative (ANCA, ANA, RF, ascl70, SSA/B).    ROV 03/14/2019 --this is a follow-up visit for 64 year old woman with history of mild intermittent asthma and bronchiectasis on CT scan of the chest with some associated pulmonary nodular disease we have been following with serial films.  There was some progression on her CT from November 2019 so we repeated a CT on 01/14/2019 which I reviewed and shows significant regression of her peribronchovascular nodularity, persistent scattered cylindrical bronchiectasis and small nodular disease suspicious for atypical mycobacterial process.  Overall reassuring scan.  She reports today that she has some exertional SOB, especially w hills - she believes stable.  She has albuterol that she can use as needed, uses approximately 1-2 times daily. Coughs few times a day - dry. Remains on GERD therapy.   ROV 05/01/20 --64 year old woman with bronchiectasis and some associated pulmonary nodular disease suspicious for atypical mycobacterial process (never proven), mild intermittent asthma, GERD.  We have not had her on scheduled bronchodilator therapy, she uses albuterol rarely - does need it during the spring and fall months for SOB. She has intermittent cough - probably daily.    Objective:   Physical Exam Wt Readings from Last 3 Encounters:  05/01/20 167 lb 3.2 oz (75.8 kg)  08/31/19 180 lb 8 oz (81.9 kg)  03/14/19 178 lb 12.8 oz (81.1 kg)   Vitals:   05/01/20 1644  BP: 124/70  Pulse: 74  Temp: (!) 97.3 F (36.3 C)  TempSrc: Temporal  SpO2: 95%  Weight: 167 lb 3.2 oz (75.8 kg)  Height: 5' 4.5" (1.638 m)    Gen: Pleasant, well-nourished, in no distress,  normal affect  ENT: No lesions,  mouth clear,  oropharynx clear,  no postnasal drip  Neck: No JVD, no stridor  Lungs: No use of accessory muscles, clear without rales or rhonchi  Cardiovascular: RRR, heart sounds normal, no murmur or gallops, no peripheral edema  Musculoskeletal: No deformities, no cyanosis or clubbing  Neuro: alert, non focal  Skin: Warm, no lesions or rashes  Exposures > has lived in [place] and [place], she has owned dogs, cats, never birds, no mold exposures Has worked in a Education officer, community, 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 No history of auto-immune disease. Distant relative had SLE.  She was dx with PNA and bronchitis frequently as a youngster.   CT chest 01/14/2019 --   COMPARISON:  Chest CT 06/24/2018.  FINDINGS: Cardiovascular: Heart size is normal. There is no significant pericardial fluid, thickening or pericardial calcification. There is aortic atherosclerosis, as well as atherosclerosis of the great vessels of the mediastinum and the coronary arteries, including calcified atherosclerotic plaque in the left anterior descending coronary artery.  Mediastinum/Nodes: No pathologically enlarged mediastinal or hilar lymph nodes. Please note that accurate exclusion of hilar adenopathy is limited on noncontrast CT scans. Circumferential thickening of the distal esophagus. No axillary lymphadenopathy.  Lungs/Pleura: When compared to the prior study the extent from 06/24/2018 of peribronchovascular micro and macronodularity has significantly regressed, particularly in the right middle lobe, medial aspect of the right upper lobe and periphery of the left upper lobe. Several small pulmonary nodules remain, either stable or smaller in size  compared to the prior examination, largest of which measures 6 x 4 mm (mean diameter 5 mm) on axial image 81 of series 3. No larger more suspicious appearing pulmonary nodules or masses are noted. No acute consolidative airspace disease. No pleural effusions.  Diffuse bronchial wall thickening with moderate centrilobular and paraseptal emphysema. Scattered areas of cylindrical bronchiectasis with some associated thickening of the peribronchovascular interstitium and regional architectural distortion, most evident in the medial segment of the right middle lobe and inferior segment of the lingula.  Upper Abdomen: Aortic atherosclerosis.  Status post cholecystectomy.  Musculoskeletal: There are no aggressive appearing lytic or blastic lesions noted in the visualized portions of the skeleton.  IMPRESSION: 1. Significant regression of previously noted peribronchovascular micro and macronodularity, as above. 2. 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 3. Diffuse bronchial wall thickening with moderate centrilobular and paraseptal emphysema; imaging findings suggestive of underlying COPD. 4. Aortic atherosclerosis, in addition to left anterior descending coronary artery disease. Please note that although the presence of coronary artery calcium documents the presence of coronary artery disease, the severity of this disease and any potential stenosis cannot be assessed on this non-gated CT examination. Assessment for potential risk factor modification, dietary therapy or pharmacologic therapy may be warranted, if clinically indicated.     Assessment & Plan:   Bronchiectasis without complication ([place]) Clinically stable and only mild bronchiectatic change on her most recent CT scan from May 2020, also stable.  She has some subtle micronodular disease, possible colonization with atypical mycobacterial organism.  At this point given her clinical stability I think we can hold off on a repeat CT, hold off on any other diagnostics.  Continue mucus clearance  efforts.  Mild intermittent asthma Has not required a maintenance bronchodilator regimen.  Continue albuterol as needed  GERD Continue Nexium.  She does become symptomatic if she misses it.  Upper airway cough syndrome Fairly well controlled.  Tolerates Nexium and benefits.  She coughs about once a day without proxisms   Baltazar Apo, MD, PhD 05/01/2020, 5:03 PM [facility] [phone] or if no answer [phone]

## 2020-05-01 NOTE — Assessment & Plan Note (Signed)
Clinically stable and only mild bronchiectatic change on her most recent CT scan from May 2020, also stable.  She has some subtle micronodular disease, possible colonization with atypical mycobacterial organism.  At this point given her clinical stability I think we can hold off on a repeat CT, hold off on any other diagnostics.  Continue mucus clearance efforts.

## 2020-05-01 NOTE — Assessment & Plan Note (Signed)
Continue Nexium.  She does become symptomatic if she misses it.

## 2020-05-01 NOTE — Assessment & Plan Note (Signed)
Fairly well controlled.  Tolerates Nexium and benefits.  She coughs about once a day without proxisms

## 2020-08-30 ENCOUNTER — Ambulatory Visit: Payer: 59 | Admitting: Cardiology

## 2020-08-30 ENCOUNTER — Other Ambulatory Visit: Payer: Self-pay

## 2020-08-30 ENCOUNTER — Other Ambulatory Visit (HOSPITAL_COMMUNITY): Payer: Self-pay | Admitting: Internal Medicine

## 2020-08-30 ENCOUNTER — Encounter: Payer: Self-pay | Admitting: Cardiology

## 2020-08-30 VITALS — BP 129/63 | HR 83 | Resp 16 | Ht 64.5 in | Wt 171.1 lb

## 2020-08-30 DIAGNOSIS — E7800 Pure hypercholesterolemia, unspecified: Secondary | ICD-10-CM

## 2020-08-30 DIAGNOSIS — Z1231 Encounter for screening mammogram for malignant neoplasm of breast: Secondary | ICD-10-CM

## 2020-08-30 DIAGNOSIS — Z87891 Personal history of nicotine dependence: Secondary | ICD-10-CM

## 2020-08-30 DIAGNOSIS — I340 Nonrheumatic mitral (valve) insufficiency: Secondary | ICD-10-CM

## 2020-08-30 DIAGNOSIS — I2510 Atherosclerotic heart disease of native coronary artery without angina pectoris: Secondary | ICD-10-CM

## 2020-08-30 DIAGNOSIS — I4510 Unspecified right bundle-branch block: Secondary | ICD-10-CM

## 2020-08-30 DIAGNOSIS — I10 Essential (primary) hypertension: Secondary | ICD-10-CM

## 2020-08-30 DIAGNOSIS — I2584 Coronary atherosclerosis due to calcified coronary lesion: Secondary | ICD-10-CM

## 2020-08-30 MED ORDER — VERAPAMIL HCL ER 240 MG PO CP24: 240.0000 mg | ORAL_CAPSULE | Freq: Every day | ORAL | 3 refills | Status: DC

## 2020-08-30 MED ORDER — ROSUVASTATIN CALCIUM 10 MG PO TABS: 10.0000 mg | ORAL_TABLET | Freq: Every day | ORAL | 3 refills | Status: DC

## 2020-08-30 MED ORDER — IRBESARTAN 150 MG PO TABS: 150.0000 mg | ORAL_TABLET | Freq: Every day | ORAL | 3 refills | Status: DC

## 2020-08-30 NOTE — Progress Notes (Signed)
[name] Date of Birth: [birth year] MRN: [id number] Primary Care Provider:Tisovec, Fransico Him, MD Former Cardiology Providers: Jeri Lager, APRN, FNP-C Primary Cardiologist: Rex Kras, DO, [facility] (established care August 30, 2020)  Date: 08/30/20 Last Visit: 08/31/2019   Chief Complaint  Patient presents with  . Hypertension  . Coronary artery calcification  . Follow-up    1 year    HPI  [name] is a 65 y.o.  female who presents to the office with a chief complaint of " 1 year follow-up for blood pressure, tachycardia, and hx of coronary artery calcification." Patient's past medical history and cardiovascular risk factors include: History of tachycardia, hypertension, hyperlipidemia, coronary artery atherosclerosis due to calcified plaque, mitral regurgitation, postmenopausal female, advanced age.  Patient was formally under the care of Jeri Lager, APRN, FNP-C for tachycardia, and coronary artery calcification.  She presents for 1 year follow-up.  Patient states that she is doing well from a cardiovascular standpoint.  Her tachycardia is essentially stable with current dose of verapamil.  Given her history of coronary artery calcification she is on aspirin and statin therapy.  Outside labs independently reviewed during this encounter.  Patient states that her blood pressures at home are very well controlled.  Patient is requesting refills on her antihypertension medications as well.  Since last office visit she denies any chest pain or anginal equivalent.  No recent hospitalizations for cardiovascular symptoms according to patient.  Patient has good functional capacity for age and no significant change in overall physical endurance since last visit.  FUNCTIONAL STATUS: Walks 1-2 miles 3-4 times per week.     ALLERGIES: Allergies  Allergen Reactions  . Dexlansoprazole Diarrhea, Other (See Comments) and Nausea And Vomiting    N/V/D  . Oxycodone-Acetaminophen  Other (See Comments)    extreme headache  . Percocet [Oxycodone-Acetaminophen]     Vomit and extreme headache  . Latex Rash     MEDICATION LIST PRIOR TO VISIT: Current Outpatient Medications on File Prior to Visit  Medication Sig Dispense Refill  . albuterol (VENTOLIN HFA) 108 (90 Base) MCG/ACT inhaler Inhale 2 puffs into the lungs every 6 (six) hours as needed for wheezing or shortness of breath. 18 g 11  . aspirin EC 81 MG tablet Take 81 mg by mouth daily. Swallow whole.    . cyclobenzaprine (FLEXERIL) 10 MG tablet Take 10 mg by mouth at bedtime.    Marland Kitchen esomeprazole (NEXIUM) 20 MG packet Take 20 mg by mouth in the morning and at bedtime.    . Melatonin 5 MG CAPS Take 5 mg by mouth at bedtime.    . trospium (SANCTURA) 20 MG tablet Take 20 mg by mouth at bedtime.     No current facility-administered medications on file prior to visit.    PAST MEDICAL HISTORY: Past Medical History:  Diagnosis Date  . Allergy   . Coronary atherosclerosis due to calcified coronary lesion   . GERD (gastroesophageal reflux disease)   . Heartburn   . High blood pressure   . Overactive bladder     PAST SURGICAL HISTORY: Past Surgical History:  Procedure Laterality Date  . ABDOMINAL HYSTERECTOMY    . CHOLECYSTECTOMY    . COLONOSCOPY  2010   Dr. Aviva Signs: four polyps in rectum and sigmoid colon (hyperplastic polyps)  . COLONOSCOPY N/A 02/07/2020   Procedure: COLONOSCOPY;  Surgeon: Daneil Dolin, MD;  Location: [facility];  Service: Endoscopy;  Laterality: N/A;  7:30  . ESOPHAGOGASTRODUODENOSCOPY N/A 06/08/2014  SLF: 1. No obvious source for dyspepsia identified. 2. Medium sized hiatal hernia 3. Mild non-erosive gastritis.  Marland Kitchen ESOPHAGOGASTRODUODENOSCOPY  2010   Dr. Aviva Signs: hiatal hernia  . EUS  05/21/10   outlaw:hyperechoic liver mild consistent with steatosis otherwise normal [place]  . GALLBLADDER SURGERY  2004  . HEMOSTASIS CLIP PLACEMENT  02/07/2020   Procedure: HEMOSTASIS CLIP  PLACEMENT;  Surgeon: Daneil Dolin, MD;  Location: [facility];  Service: Endoscopy;;  . LAPAROSCOPIC NISSEN FUNDOPLICATION N/A [id number]   Procedure: LAPAROSCOPIC NISSEN FUNDOPLICATION;  Surgeon: Jamesetta So, MD;  Location: [facility];  Service: General;  Laterality: N/A;  . POLYPECTOMY  02/07/2020   Procedure: POLYPECTOMY;  Surgeon: Daneil Dolin, MD;  Location: [facility];  Service: Endoscopy;;  . TONSILLECTOMY    . VIDEO BRONCHOSCOPY Bilateral 01/23/2014   Procedure: VIDEO BRONCHOSCOPY WITHOUT FLUORO;  Surgeon: Collene Gobble, MD;  Location: [facility];  Service: Cardiopulmonary;  Laterality: Bilateral;    FAMILY HISTORY: The patient's family history includes Allergies in her brother and mother; Cancer in her mother; Heart disease in her father, maternal grandfather, and paternal grandmother.   SOCIAL HISTORY:  The patient  reports that she quit smoking about 12 years ago. Her smoking use included cigarettes. She has a 45.00 pack-year smoking history. She has never used smokeless tobacco. She reports that she does not drink alcohol and does not use drugs.  Review of Systems  Constitutional: Negative for chills and fever.  HENT: Negative for hoarse voice and nosebleeds.   Eyes: Negative for discharge, double vision and pain.  Cardiovascular: Negative for chest pain, claudication, dyspnea on exertion, leg swelling, near-syncope, orthopnea, palpitations, paroxysmal nocturnal dyspnea and syncope.  Respiratory: Negative for hemoptysis and shortness of breath.   Musculoskeletal: Negative for muscle cramps and myalgias.  Gastrointestinal: Negative for abdominal pain, constipation, diarrhea, hematemesis, hematochezia, melena, nausea and vomiting.  Neurological: Negative for dizziness and light-headedness.    PHYSICAL EXAM: Vitals with BMI 08/30/2020 05/01/2020 02/07/2020  Height 5' 4.5" 5' 4.5" -  Weight 171 lbs 2 oz 167 lbs 3 oz -  BMI 52.84 13.24 -  Systolic 401 027 253  Diastolic  63 70 66  Pulse 83 74 -   CONSTITUTIONAL: Well-developed and well-nourished. No acute distress.  SKIN: Skin is warm and dry. No rash noted. No cyanosis. No pallor. No jaundice HEAD: Normocephalic and atraumatic.  EYES: No scleral icterus MOUTH/THROAT: Moist oral membranes.  NECK: No JVD present. No thyromegaly noted. No carotid bruits  LYMPHATIC: No visible cervical adenopathy.  CHEST Normal respiratory effort. No intercostal retractions  LUNGS: Clear to auscultation bilaterally. No stridor. No wheezes. No rales.  CARDIOVASCULAR: Regular rate and rhythm, positive G6-Y4, soft holosystolic murmur heard at the apex, no rubs or gallops appreciated. ABDOMINAL: No apparent ascites.  EXTREMITIES: No peripheral edema  HEMATOLOGIC: No significant bruising NEUROLOGIC: Oriented to person, place, and time. Nonfocal. Normal muscle tone.  PSYCHIATRIC: Normal mood and affect. Normal behavior. Cooperative  RADIOLOGY: CT chest high resolution 06/24/2018: 1. New clustered nodularity/tree-in-bud opacities in the medial basilar right upper lobe and peripheral left upper lobe with dominant new 6 mm peripheral left upper lobe nodule. While probably inflammatory related to progression of MAI given the below findings, non-contrast chest CT at 3-6 months is recommended in this high-risk patient with smoking related changes in the lungs. This recommendation follows the consensus statement: Guidelines for Management of Incidental Pulmonary Nodules Detected on CT Images: From the Fleischner Society 2017; Radiology 2017; 284:228-243.  2. 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 3. Moderate emphysema with mild diffuse bronchial wall thickening, suggesting COPD. 4. Small hiatal hernia. Fluid in the thoracic esophagus, suggesting esophageal dysmotility and/or gastroesophageal  reflux. Nonspecific mild circumferential wall thickening in the lower thoracic esophagus, stable, most commonly due to reflux esophagitis, with Barrett's esophagus or neoplasm not excluded. 5. One vessel coronary atherosclerosis. Aortic Atherosclerosis (ICD10-I70.0) and Emphysema (ICD10-J43.9  CARDIAC DATABASE: EKG: 08/30/2020: Sinus  Rhythm, 71bpm, RBBB, without underlying injury pattern.   Echocardiogram: [07/22/2016]: 1. Left ventricle cavity is normal in size. Hyperdynamic global wall motion. Visual EF is >70%. Doppler evidence of grade I (impaired) diastolic dysfunction, Calculated EF 67%. 2. Mild (Grade I) mitral regurgitation. 3. Mild tricuspid regurgitation. Can't calculate PA pressure accuratrely due to insignificant TR signal.  Stress Testing:  Nuclear stress test [08/06/2016]: 1. The resting electrocardiogram demonstrated normal sinus rhythm, RBBB, no resting arrhythmias and normal rest repolarization. The stress electrocardiogram was normal. Patient exercised on Bruce protocol for 7:00 minutes and achieved 7.05 METS. Stress test terminated due to dyspnea and 91% MPHR achieved (Target HR >85%). Hypertensive Bp response (200/98 mm Hg peak). 2. Myocardial perfusion imaging is normal. Overall left ventricular systolic function was normal without regional wall motion abnormalities. The left ventricular ejection fraction was 71%.  Heart Catheterization: None  LABORATORY DATA: External Labs: Collected: February 13, 2020 Creatinine 0.8 mg/dL. eGFR: 72 mL/min per 1.73 m Potassium 4.7  Hemoglobin 16 g/dL, hematocrit 49.9% Lipid profile: Total cholesterol 153, triglycerides 93, HDL 55, LDL 79, non HDL 98 Hemoglobin A1c: 5.8%  IMPRESSION:    ICD-10-CM   1. Coronary atherosclerosis due to calcified coronary lesion of native artery  I25.10 rosuvastatin (CRESTOR) 10 MG tablet   I25.84   2. Essential hypertension  I10 EKG 12-Lead    verapamil (VERELAN PM) 240 MG 24 hr capsule     irbesartan (AVAPRO) 150 MG tablet  3. Right bundle branch block  I45.10 verapamil (VERELAN PM) 240 MG 24 hr capsule  4. Pure hypercholesterolemia  E78.00   5. Former smoker  Z87.891   61. Nonrheumatic mitral valve regurgitation  I34.0 PCV ECHOCARDIOGRAM COMPLETE     RECOMMENDATIONS: [name] is a 65 y.o. female whose past medical history and cardiovascular risk factors include: tachycardia, hypertension, hyperlipidemia, coronary artery atherosclerosis due to calcified plaque, mitral regurgitation, postmenopausal female, advanced age.  Coronary artery atherosclerosis due to calcified coronary lesion without angina pectoris:  Continue aspirin and statin therapy.  Patient is requesting a refill on Crestor.  Outside labs independently reviewed and noted above.  Patient has undergone an echocardiogram and stress test in the past results reviewed with her at today's visit.  Tachycardia:  Continue verapamil.  Patient discussed medication profile.  Prescription refilled.  Mitral regurgitation, nonrheumatic:  Last echocardiogram 5 years ago.  Patient will be scheduled for a repeat echocardiogram prior to her yearly visit next year.  Hyperlipidemia:  Continue statin therapy.  Crestor refilled.  Last lipid profile from July 2021 reviewed.  Recommend an LDL of less than 70 mg/dL.  Patient states that she will focus on lifestyle changes and improve her cardiovascular risk factors.  Currently managed by primary care provider.  Benign essential hypertension:  Office blood pressure is very well controlled.  Medications reconciled.  Low-salt diet reiterated.  Avapro refilled.  Former smoker: Educated on the importance of continued smoking cessation.  FINAL MEDICATION LIST END OF ENCOUNTER:  Meds ordered this encounter  Medications  . verapamil (VERELAN PM) 240 MG 24 hr capsule    Sig: Take 1 capsule (240 mg total) by mouth daily.    Dispense:  90 capsule    Refill:  3   . rosuvastatin (CRESTOR) 10 MG tablet    Sig: Take 1 tablet (10 mg total) by mouth at bedtime.    Dispense:  90 tablet    Refill:  3  . irbesartan (AVAPRO) 150 MG tablet    Sig: Take 1 tablet (150 mg total) by mouth daily.    Dispense:  90 tablet    Refill:  3    Medications Discontinued During This Encounter  Medication Reason  . irbesartan (AVAPRO) 150 MG tablet Reorder  . verapamil (VERELAN PM) 240 MG 24 hr capsule Reorder  . rosuvastatin (CRESTOR) 10 MG tablet Reorder     Current Outpatient Medications:  .  albuterol (VENTOLIN HFA) 108 (90 Base) MCG/ACT inhaler, Inhale 2 puffs into the lungs every 6 (six) hours as needed for wheezing or shortness of breath., Disp: 18 g, Rfl: 11 .  aspirin EC 81 MG tablet, Take 81 mg by mouth daily. Swallow whole., Disp: , Rfl:  .  cyclobenzaprine (FLEXERIL) 10 MG tablet, Take 10 mg by mouth at bedtime., Disp: , Rfl:  .  esomeprazole (NEXIUM) 20 MG packet, Take 20 mg by mouth in the morning and at bedtime., Disp: , Rfl:  .  Melatonin 5 MG CAPS, Take 5 mg by mouth at bedtime., Disp: , Rfl:  .  trospium (SANCTURA) 20 MG tablet, Take 20 mg by mouth at bedtime., Disp: , Rfl:  .  irbesartan (AVAPRO) 150 MG tablet, Take 1 tablet (150 mg total) by mouth daily., Disp: 90 tablet, Rfl: 3 .  rosuvastatin (CRESTOR) 10 MG tablet, Take 1 tablet (10 mg total) by mouth at bedtime., Disp: 90 tablet, Rfl: 3 .  verapamil (VERELAN PM) 240 MG 24 hr capsule, Take 1 capsule (240 mg total) by mouth daily., Disp: 90 capsule, Rfl: 3  Orders Placed This Encounter  Procedures  . EKG 12-Lead  . PCV ECHOCARDIOGRAM COMPLETE   --Continue cardiac medications as reconciled in final medication list. --Return in about 1 year (around 08/25/2021) for Follow up, BP, Lipid, Coronary artery calcification. Or sooner if needed. --Continue follow-up with your primary care physician regarding the management of your other chronic comorbid conditions.  Patient's questions and concerns were  addressed to her satisfaction. She voices understanding of the instructions provided during this encounter.   This note was created using a voice recognition software as a result there may be grammatical errors inadvertently enclosed that do not reflect the nature of this encounter. Every attempt is made to correct such errors.  Rex Kras, [place], [facility]  Pager: [phone] Office: [phone]

## 2020-09-12 ENCOUNTER — Ambulatory Visit (HOSPITAL_COMMUNITY)
Admission: RE | Admit: 2020-09-12 | Discharge: 2020-09-12 | Disposition: A | Discharge status: Home / Self Care | Payer: 59 | Source: Ambulatory Visit | Attending: Internal Medicine | Admitting: Internal Medicine

## 2020-09-12 ENCOUNTER — Other Ambulatory Visit: Payer: Self-pay

## 2020-09-12 DIAGNOSIS — Z1231 Encounter for screening mammogram for malignant neoplasm of breast: Secondary | ICD-10-CM | POA: Diagnosis present

## 2020-12-05 ENCOUNTER — Telehealth: Payer: Self-pay | Admitting: Emergency Medicine

## 2020-12-05 DIAGNOSIS — U071 COVID-19: Secondary | ICD-10-CM

## 2020-12-05 NOTE — Telephone Encounter (Signed)
Patient is aware of below message/recommendations. She voiced her understanding and had no further questions.  Referral has been placed to infusion clinic.  Nothing further needed at this time.

## 2020-12-05 NOTE — Telephone Encounter (Signed)
She is still in the window when she might qualify for the mAb. I think we should refer her if she agrees

## 2020-12-05 NOTE — Telephone Encounter (Signed)
Spoke with the pt  She states that she started having sneezing, watery eyes 12/02/20  She thought that was just allergies but on 4/12 developed dry cough- took at home covid test and this was neg 4/13 started with sore throat and HA in the evening She took another test today and it was positive  She is not having any increased SOB or wheezing  Just the cough, HA and sore throat at this point  I advised use albuterol if needed should she have any increased SOB, rest, fluids, tylenol for HA may help  RB- do you want me to refer for possible MAB? She has had 3 covid vax Please advise thank you!

## 2020-12-06 ENCOUNTER — Telehealth: Payer: Self-pay | Admitting: Unknown Physician Specialty

## 2020-12-06 ENCOUNTER — Telehealth: Payer: Self-pay

## 2020-12-06 ENCOUNTER — Other Ambulatory Visit: Payer: Self-pay | Admitting: Unknown Physician Specialty

## 2020-12-06 DIAGNOSIS — E1169 Type 2 diabetes mellitus with other specified complication: Secondary | ICD-10-CM

## 2020-12-06 DIAGNOSIS — U071 COVID-19: Secondary | ICD-10-CM

## 2020-12-06 MED ORDER — MOLNUPIRAVIR EUA 200MG CAPSULE: 4.0000 | ORAL_CAPSULE | Freq: Two times a day (BID) | ORAL | 0 refills | Status: AC

## 2020-12-06 NOTE — Telephone Encounter (Signed)
Outpatient Oral COVID Treatment Note  I connected with [name] on 12/06/2020/11:20 AM by telephone and verified that I am speaking with the correct person using two identifiers.  I discussed the limitations, risks, security, and privacy concerns of performing an evaluation and management service by telephone and the availability of in person appointments. I also discussed with the patient that there may be a patient responsible charge related to this service. The patient expressed understanding and agreed to proceed.  Patient location: home Provider location: home  Diagnosis: COVID-19 infection  Purpose of visit: Discussion of potential use of Molnupiravir or Paxlovid, a new treatment for mild to moderate COVID-19 viral infection in non-hospitalized patients.   Subjective: Patient is a 65 y.o. female who has been diagnosed with COVID 19 viral infection.  Their symptoms began on 4/12 with congestion    Past Medical History:  Diagnosis Date  . Allergy   . Coronary atherosclerosis due to calcified coronary lesion   . GERD (gastroesophageal reflux disease)   . Heartburn   . High blood pressure   . Overactive bladder     Allergies  Allergen Reactions  . Dexlansoprazole Diarrhea, Other (See Comments) and Nausea And Vomiting    N/V/D  . Oxycodone-Acetaminophen Other (See Comments)    extreme headache  . Percocet [Oxycodone-Acetaminophen]     Vomit and extreme headache  . Latex Rash     Current Outpatient Medications:  .  albuterol (VENTOLIN HFA) 108 (90 Base) MCG/ACT inhaler, Inhale 2 puffs into the lungs every 6 (six) hours as needed for wheezing or shortness of breath., Disp: 18 g, Rfl: 11 .  aspirin EC 81 MG tablet, Take 81 mg by mouth daily. Swallow whole., Disp: , Rfl:  .  cyclobenzaprine (FLEXERIL) 10 MG tablet, Take 10 mg by mouth at bedtime., Disp: , Rfl:  .  esomeprazole (NEXIUM) 20 MG packet, Take 20 mg by mouth in the morning and at bedtime., Disp: , Rfl:  .   irbesartan (AVAPRO) 150 MG tablet, Take 1 tablet (150 mg total) by mouth daily., Disp: 90 tablet, Rfl: 3 .  Melatonin 5 MG CAPS, Take 5 mg by mouth at bedtime., Disp: , Rfl:  .  rosuvastatin (CRESTOR) 10 MG tablet, Take 1 tablet (10 mg total) by mouth at bedtime., Disp: 90 tablet, Rfl: 3 .  trospium (SANCTURA) 20 MG tablet, Take 20 mg by mouth at bedtime., Disp: , Rfl:  .  verapamil (VERELAN PM) 240 MG 24 hr capsule, Take 1 capsule (240 mg total) by mouth daily., Disp: 90 capsule, Rfl: 3  Objective: Patient appears/sounds congested.  They are in no apparent distress.  Breathing is non labored.  Mood and behavior are normal.  Laboratory Data:  No results found for this or any previous visit (from the past 2160 hour(s)).   Assessment: 65 y.o. female with mild/moderate COVID 19 viral infection diagnosed on 4/12 at high risk for progression to severe COVID 19.  Plan:  This patient is a 65 y.o. female that meets the following criteria for Emergency Use Authorization of: Molnupiravir  1. Age >18 yr 2. SARS-COV-2 positive test 3. Symptom onset < 5 days 4. Mild-to-moderate COVID disease with high risk for severe progression to hospitalization or death   I have spoken and communicated the following to the patient or parent/caregiver regarding: 1. Molnupiravir is an unapproved drug that is authorized for use under an [employer] production planner.  2. There are no adequate, approved, available products for the treatment of  COVID-19 in adults who have mild-to-moderate COVID-19 and are at high risk for progressing to severe COVID-19, including hospitalization or death. 3. Other therapeutics are currently authorized. For additional information on all products authorized for treatment or prevention of COVID-19, please see [employer].  4. There are benefits and risks of taking this treatment as  outlined in the "Fact Sheet for Patients and Caregivers."  5. "Fact Sheet for Patients and Caregivers" was reviewed with patient. A hard copy will be provided to patient from pharmacy prior to the patient receiving treatment. 6. Patients should continue to self-isolate and use infection control measures (e.g., wear mask, isolate, social distance, avoid sharing personal items, clean and disinfect "high touch" surfaces, and frequent handwashing) according to CDC guidelines.  7. The patient or parent/caregiver has the option to accept or refuse treatment. 8. [place] has established a pregnancy surveillance program. 9. Females of childbearing potential should use a reliable method of contraception correctly and consistently, as applicable, for the duration of treatment and for 4 days after the last dose of Molnupiravir. 64. Males of reproductive potential who are sexually active with females of childbearing potential should use a reliable method of contraception correctly and consistently during treatment and for at least 3 months after the last dose. 11. Pregnancy status and risk was assessed. Patient verbalized understanding of precautions.   After reviewing above information with the patient, the patient agrees to receive molnupiravir.  Follow up instructions:    . Take prescription BID x 5 days as directed . Reach out to pharmacist for counseling on medication if desired . For concerns regarding further COVID symptoms please follow up with your PCP or urgent care . For urgent or life-threatening issues, seek care at your local emergency department  The patient was provided an opportunity to ask questions, and all were answered. The patient agreed with the plan and demonstrated an understanding of the instructions.   Script sent to [facility] and opted to pick up RX.  The patient was advised to call their PCP or seek an in-person evaluation if the symptoms worsen or if the  condition fails to improve as anticipated.    Unable to prescribe Paxlovid due to no labs.    Therefore will also schedule MAB  I I have spoken and communicated the following to the patient or parent/caregiver regarding COVID monoclonal antibody treatment:  2. FDA has authorized the emergency use for the treatment of mild to moderate COVID-19 in adults and pediatric patients with positive results of direct SARS-CoV-2 viral testing who are 61 years of age and older weighing at least 40 kg, and who are at high risk for progressing to severe COVID-19 and/or hospitalization.  3. The significant known and potential risks and benefits of COVID monoclonal antibody, and the extent to which such potential risks and benefits are unknown.  4. Information on available alternative treatments and the risks and benefits of those alternatives, including clinical trials.  5. Patients treated with COVID monoclonal antibody should continue to self-isolate and use infection control measures (e.g., wear mask, isolate, social distance, avoid sharing personal items, clean and disinfect "high touch" surfaces, and frequent handwashing) according to CDC guidelines.   6. The patient or parent/caregiver has the option to accept or refuse COVID monoclonal antibody treatment.  After reviewing this information with the patient, the patient has agreed to receive one of the available covid 19 monoclonal antibodies and will be provided an appropriate fact sheet prior to infusion.  Kathrine Haddock, NP 12/06/2020 11:27 AM   I provided 30 minutes of non face-to-face telephone visit time during this encounter, and > 50% was spent counseling as documented under my assessment & plan.    Kathrine Haddock, NP 12/06/2020 /11:20 AM

## 2020-12-06 NOTE — Telephone Encounter (Signed)
Called to discuss with patient about COVID-19 symptoms and the use of one of the available treatments for those with mild to moderate Covid symptoms and at a high risk of hospitalization.  Pt appears to qualify for outpatient treatment due to co-morbid conditions and/or a member of an at-risk group in accordance with the FDA Emergency Use Authorization.    Symptom onset: 12/03/20 Cough, headache,sore throat Vaccinated: Yes Booster? Yes Immunocompromised? No Qualifiers: Asthma,COPD,HTN  Pt. Would like to speak with APP.  Marcello Moores

## 2020-12-09 ENCOUNTER — Other Ambulatory Visit: Payer: Self-pay

## 2020-12-09 ENCOUNTER — Ambulatory Visit (INDEPENDENT_AMBULATORY_CARE_PROVIDER_SITE_OTHER): Payer: 59

## 2020-12-09 VITALS — BP 120/78 | HR 67 | Temp 97.5°F | Resp 16

## 2020-12-09 DIAGNOSIS — E1169 Type 2 diabetes mellitus with other specified complication: Secondary | ICD-10-CM

## 2020-12-09 DIAGNOSIS — J1282 Pneumonia due to coronavirus disease 2019: Secondary | ICD-10-CM

## 2020-12-09 DIAGNOSIS — U071 COVID-19: Secondary | ICD-10-CM

## 2020-12-09 MED ORDER — METHYLPREDNISOLONE SODIUM SUCC 125 MG IJ SOLR: 125.0000 mg | Freq: Once | INTRAMUSCULAR | Status: AC | PRN

## 2020-12-09 MED ORDER — BEBTELOVIMAB 175 MG/2 ML IV (EUA): 175.0000 mg | Freq: Once | INTRAMUSCULAR | Status: DC

## 2020-12-09 MED ORDER — EPINEPHRINE 0.3 MG/0.3ML IJ SOAJ: 0.3000 mg | Freq: Once | INTRAMUSCULAR | Status: AC | PRN

## 2020-12-09 MED ORDER — ALBUTEROL SULFATE HFA 108 (90 BASE) MCG/ACT IN AERS: 2.0000 | INHALATION_SPRAY | Freq: Once | RESPIRATORY_TRACT | Status: AC | PRN

## 2020-12-09 MED ORDER — FAMOTIDINE IN NACL 20-0.9 MG/50ML-% IV SOLN: 20.0000 mg | Freq: Once | INTRAVENOUS | Status: DC | PRN

## 2020-12-09 MED ORDER — BEBTELOVIMAB 175 MG/2 ML IV (EUA)
175.0000 mg | Freq: Once | INTRAMUSCULAR | Status: AC
  Administered 2020-12-09: 175 mg via INTRAVENOUS

## 2020-12-09 MED ORDER — FAMOTIDINE IN NACL 20-0.9 MG/50ML-% IV SOLN: 20.0000 mg | Freq: Once | INTRAVENOUS | Status: AC | PRN

## 2020-12-09 MED ORDER — SODIUM CHLORIDE 0.9 % IV SOLN
INTRAVENOUS | Status: DC | PRN
Start: 2020-12-09 — End: 2021-09-01

## 2020-12-09 MED ORDER — METHYLPREDNISOLONE SODIUM SUCC 125 MG IJ SOLR: 125.0000 mg | Freq: Once | INTRAMUSCULAR | Status: DC | PRN

## 2020-12-09 MED ORDER — DIPHENHYDRAMINE HCL 50 MG/ML IJ SOLN: 50.0000 mg | Freq: Once | INTRAMUSCULAR | Status: AC | PRN

## 2020-12-09 MED ORDER — SODIUM CHLORIDE 0.9 % IV SOLN
INTRAVENOUS | Status: DC | PRN
Start: 2020-12-09 — End: 2020-12-09

## 2020-12-09 MED ORDER — DIPHENHYDRAMINE HCL 50 MG/ML IJ SOLN: 50.0000 mg | Freq: Once | INTRAMUSCULAR | Status: DC | PRN

## 2020-12-09 MED ORDER — EPINEPHRINE 0.3 MG/0.3ML IJ SOAJ: 0.3000 mg | Freq: Once | INTRAMUSCULAR | Status: DC | PRN

## 2020-12-09 MED ORDER — ALBUTEROL SULFATE HFA 108 (90 BASE) MCG/ACT IN AERS: 2.0000 | INHALATION_SPRAY | Freq: Once | RESPIRATORY_TRACT | Status: DC | PRN

## 2020-12-09 NOTE — Progress Notes (Signed)
Previous orders discontinues as they were ordered from incorrect orderset for ambulatory infusion center.  Orders placed with correct smartset and validated to be correct by Tresa Res.  Owens Hara, Charlane Ferretti

## 2020-12-09 NOTE — Patient Instructions (Signed)
10 Things You Can Do to Manage Your COVID-19 Symptoms at Home If you have possible or confirmed COVID-19: 1. Stay home except to get medical care. 2. Monitor your symptoms carefully. If your symptoms get worse, call your healthcare provider immediately. 3. Get rest and stay hydrated. 4. If you have a medical appointment, call the healthcare provider ahead of time and tell them that you have or may have COVID-19. 5. For medical emergencies, call 911 and notify the dispatch personnel that you have or may have COVID-19. 6. Cover your cough and sneezes with a tissue or use the inside of your elbow. 7. Wash your hands often with soap and water for at least 20 seconds or clean your hands with an alcohol-based hand sanitizer that contains at least 60% alcohol. 8. As much as possible, stay in a specific room and away from other people in your home. Also, you should use a separate bathroom, if available. If you need to be around other people in or outside of the home, wear a mask. 9. Avoid sharing personal items with other people in your household, like dishes, towels, and bedding. 10. Clean all surfaces that are touched often, like counters, tabletops, and doorknobs. Use household cleaning sprays or wipes according to the label instructions. cdc.gov/coronavirus 03/08/2020 This information is not intended to replace advice given to you by your health care provider. Make sure you discuss any questions you have with your health care provider. Document Revised: 06/24/2020 Document Reviewed: 06/24/2020 Elsevier Patient Education  2021 Elsevier Inc.  What types of side effects do monoclonal antibody drugs cause?  Common side effects  In general, the more common side effects caused by monoclonal antibody drugs include: . Allergic reactions, such as hives or itching . Flu-like signs and symptoms, including chills, fatigue, fever, and muscle aches and pains . Nausea, vomiting . Diarrhea . Skin  rashes . Low blood pressure   The CDC is recommending patients who receive monoclonal antibody treatments wait at least 90 days before being vaccinated.  Currently, there are no data on the safety and efficacy of mRNA COVID-19 vaccines in persons who received monoclonal antibodies or convalescent plasma as part of COVID-19 treatment. Based on the estimated half-life of such therapies as well as evidence suggesting that reinfection is uncommon in the 90 days after initial infection, vaccination should be deferred for at least 90 days, as a precautionary measure until additional information becomes available, to avoid interference of the antibody treatment with vaccine-induced immune responses.   If someone you know is interested in receiving treatment please have them contact their MD for a referral or visit [email or web address]    

## 2021-02-03 NOTE — Progress Notes (Signed)
Diagnosis: COVID  Provider:  Marshell Garfinkel, MD  Procedure: Infusion  IV Type: Peripheral, IV Location: L Hand  Bebtelovimab, Dose: 175 mg  Infusion Start Time: 4/18 1557  Infusion Stop Time: 4/18 1558  Post Infusion IV Care: Observation period completed and Peripheral IV Discontinued  Discharge: Condition: Good, Destination: Home . AVS provided to patient.   Performed by:  Jonelle Sidle, RN

## 2021-05-06 ENCOUNTER — Ambulatory Visit: Payer: Medicare Other | Admitting: Emergency Medicine

## 2021-05-06 ENCOUNTER — Other Ambulatory Visit: Payer: Self-pay

## 2021-05-21 ENCOUNTER — Ambulatory Visit (INDEPENDENT_AMBULATORY_CARE_PROVIDER_SITE_OTHER): Payer: 59 | Admitting: Emergency Medicine

## 2021-05-21 ENCOUNTER — Encounter: Payer: Self-pay | Admitting: Emergency Medicine

## 2021-05-21 ENCOUNTER — Other Ambulatory Visit: Payer: Self-pay

## 2021-05-21 DIAGNOSIS — J4520 Mild intermittent asthma, uncomplicated: Secondary | ICD-10-CM | POA: Diagnosis not present

## 2021-05-21 DIAGNOSIS — K219 Gastro-esophageal reflux disease without esophagitis: Secondary | ICD-10-CM

## 2021-05-21 DIAGNOSIS — R0609 Other forms of dyspnea: Secondary | ICD-10-CM | POA: Insufficient documentation

## 2021-05-21 DIAGNOSIS — R0600 Dyspnea, unspecified: Secondary | ICD-10-CM

## 2021-05-21 NOTE — Assessment & Plan Note (Signed)
I am concerned that she may have interstitial lung disease or progressive obstructive disease in the aftermath of her COVID-19 about 6 months ago.  She has not gotten back to her usual functional capacity, still has exertional shortness of breath, sometimes hears some wheezing.  We will perform pulmonary function testing, high-resolution CT scan of the chest to better characterize.

## 2021-05-21 NOTE — Addendum Note (Signed)
Addended by: Gavin Potters R on: 05/21/2021 12:08 PM   Modules accepted: Orders

## 2021-05-21 NOTE — Progress Notes (Signed)
Subjective:   Patient ID: LYNA LANINGHAM, female    DOB: 11/05/1955    MRN: [id number]  Brief patient profile:   Spirometry 2012 at [facility] > AFL especially small airways. FOB was performed 01/23/14 > all cx data negative.   All auto-immune labs negative (ANCA, ANA, RF, ascl70, SSA/B).    ROV 03/14/2019 --this is a follow-up visit for 65 year old woman with history of mild intermittent asthma and bronchiectasis on CT scan of the chest with some associated pulmonary nodular disease we have been following with serial films.  There was some progression on her CT from November 2019 so we repeated a CT on 01/14/2019 which I reviewed and shows significant regression of her peribronchovascular nodularity, persistent scattered cylindrical bronchiectasis and small nodular disease suspicious for atypical mycobacterial process.  Overall reassuring scan.  She reports today that she has some exertional SOB, especially w hills - she believes stable.  She has albuterol that she can use as needed, uses approximately 1-2 times daily. Coughs few times a day - dry. Remains on GERD therapy.   ROV 05/01/20 --65 year old woman with bronchiectasis and some associated pulmonary nodular disease suspicious for atypical mycobacterial process (never proven), mild intermittent asthma, GERD.  We have not had her on scheduled bronchodilator therapy, she uses albuterol rarely - does need it during the spring and fall months for SOB. She has intermittent cough - probably daily.   ROV 05/21/2021 --follow-up visit is a 65 year old woman with a history of bronchiectasis and some associated micronodular disease suspicious for possible mycobacterial colonization (never proven).  She has associated mild intermittent asthma, GERD. She had COVID in April, received mAb. She has had persistent exertional SOB. She is hearing some exertional exp wheeze. Her sx are not responding to albuterol. No real cough. She has probably had 5 lbs wt gain.    Past  Medical History:  Diagnosis Date   Allergy    Coronary atherosclerosis due to calcified coronary lesion    GERD (gastroesophageal reflux disease)    Heartburn    High blood pressure    Overactive bladder     Objective:   Physical Exam Wt Readings from Last 3 Encounters:  05/21/21 182 lb 12.8 oz (82.9 kg)  08/30/20 171 lb 1.6 oz (77.6 kg)  05/01/20 167 lb 3.2 oz (75.8 kg)   Vitals:   05/21/21 1034  BP: 140/73  Pulse: 99  Temp: 97.8 F (36.6 C)  TempSrc: Oral  SpO2: 95%  Weight: 182 lb 12.8 oz (82.9 kg)  Height: 5\' 4"  (1.626 m)    Gen: Pleasant, well-nourished, in no distress,  normal affect  ENT: No lesions,  mouth clear,  oropharynx clear, no postnasal drip  Neck: No JVD, no stridor  Lungs: No use of accessory muscles, clear without rales or rhonchi  Cardiovascular: RRR, heart sounds normal, no murmur or gallops, no peripheral edema  Musculoskeletal: No deformities, no cyanosis or clubbing  Neuro: alert, non focal  Skin: Warm, no lesions or rashes  Exposures > has lived in [place] and [place], she has owned dogs, cats, never birds, no mold exposures Has worked in a Education officer, community, 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 No history of auto-immune disease. Distant relative had SLE.  She was dx with PNA and bronchitis frequently as a youngster.       Assessment & Plan:   GERD Cough and GERD have both benefited from PPI.  Please continue your Nexium as you  have been taking it  Dyspnea on exertion I am concerned that she may have interstitial lung disease or progressive obstructive disease in the aftermath of her COVID-19 about 6 months ago.  She has not gotten back to her usual functional capacity, still has exertional shortness of breath, sometimes hears some wheezing.  We will perform pulmonary function testing, high-resolution CT scan of the chest to better characterize.  Mild intermittent asthma Continue albuterol as needed.  Based on her  PFT will decide whether she needs to be on scheduled ICS/LABA.   Baltazar Apo, MD, PhD 05/21/2021, 11:10 AM [facility] [phone] or if no answer [phone]

## 2021-05-21 NOTE — Assessment & Plan Note (Signed)
Continue albuterol as needed.  Based on her PFT will decide whether she needs to be on scheduled ICS/LABA.

## 2021-05-21 NOTE — Patient Instructions (Addendum)
We will obtain a high-resolution CT scan of the chest to evaluate for interstitial lung disease associated with your recent COVID-19. We will repeat your pulmonary function testing in next office visit Keep albuterol available to use 2 puffs if needed for shortness of breath, chest tightness, wheezing. Please continue your Nexium as you have been taking it Follow with Dr. Lamonte Sakai next available with full pulmonary function testing on the same day.

## 2021-05-21 NOTE — Assessment & Plan Note (Signed)
Cough and GERD have both benefited from PPI.  Please continue your Nexium as you have been taking it

## 2021-05-29 ENCOUNTER — Other Ambulatory Visit: Payer: Self-pay

## 2021-05-29 ENCOUNTER — Ambulatory Visit (INDEPENDENT_AMBULATORY_CARE_PROVIDER_SITE_OTHER)
Admission: RE | Admit: 2021-05-29 | Discharge: 2021-05-29 | Disposition: A | Discharge status: Home / Self Care | Payer: Medicare Other | Source: Ambulatory Visit | Attending: Emergency Medicine | Admitting: Emergency Medicine

## 2021-05-29 DIAGNOSIS — J4520 Mild intermittent asthma, uncomplicated: Secondary | ICD-10-CM | POA: Diagnosis not present

## 2021-06-25 ENCOUNTER — Other Ambulatory Visit: Payer: Self-pay

## 2021-06-25 ENCOUNTER — Ambulatory Visit (INDEPENDENT_AMBULATORY_CARE_PROVIDER_SITE_OTHER): Payer: 59 | Admitting: Emergency Medicine

## 2021-06-25 ENCOUNTER — Ambulatory Visit (INDEPENDENT_AMBULATORY_CARE_PROVIDER_SITE_OTHER): Payer: Medicare Other | Admitting: Emergency Medicine

## 2021-06-25 ENCOUNTER — Encounter: Payer: Self-pay | Admitting: Emergency Medicine

## 2021-06-25 DIAGNOSIS — J4520 Mild intermittent asthma, uncomplicated: Secondary | ICD-10-CM

## 2021-06-25 DIAGNOSIS — J479 Bronchiectasis, uncomplicated: Secondary | ICD-10-CM | POA: Diagnosis not present

## 2021-06-25 LAB — PULMONARY FUNCTION TEST
DL/VA % pred: 91 %
DL/VA: 3.82 ml/min/mmHg/L
DLCO cor % pred: 71 %
DLCO cor: 13.83 ml/min/mmHg
DLCO unc % pred: 71 %
DLCO unc: 13.83 ml/min/mmHg
FEF 25-75 Post: 1.16 L/sec
FEF 25-75 Pre: 1.36 L/sec
FEF2575-%Change-Post: -15 %
FEF2575-%Pred-Post: 56 %
FEF2575-%Pred-Pre: 66 %
FEV1-%Change-Post: 0 %
FEV1-%Pred-Post: 75 %
FEV1-%Pred-Pre: 75 %
FEV1-Post: 1.76 L
FEV1-Pre: 1.78 L
FEV1FVC-%Change-Post: 1 %
FEV1FVC-%Pred-Pre: 96 %
FEV6-%Change-Post: -2 %
FEV6-%Pred-Post: 79 %
FEV6-%Pred-Pre: 81 %
FEV6-Post: 2.33 L
FEV6-Pre: 2.4 L
FEV6FVC-%Pred-Post: 104 %
FEV6FVC-%Pred-Pre: 104 %
FVC-%Change-Post: -2 %
FVC-%Pred-Post: 76 %
FVC-%Pred-Pre: 78 %
FVC-Post: 2.33 L
FVC-Pre: 2.4 L
Post FEV1/FVC ratio: 75 %
Post FEV6/FVC ratio: 100 %
Pre FEV1/FVC ratio: 74 %
Pre FEV6/FVC Ratio: 100 %
RV % pred: 103 %
RV: 2.16 L
TLC % pred: 90 %
TLC: 4.5 L

## 2021-06-25 MED ORDER — BREZTRI AEROSPHERE 160-9-4.8 MCG/ACT IN AERO: 2.0000 | INHALATION_SPRAY | Freq: Two times a day (BID) | RESPIRATORY_TRACT | 0 refills | Status: DC

## 2021-06-25 NOTE — Progress Notes (Signed)
Subjective:   Patient ID: MELLONIE GUESS, female    DOB: [birth year]    MRN: [id number]  Brief patient profile:   Spirometry 2012 at [facility] > AFL especially small airways. FOB was performed 01/23/14 > all cx data negative.   All auto-immune labs negative (ANCA, ANA, RF, ascl70, SSA/B).    ROV 05/21/2021 --follow-up visit is a 65 year old woman with a history of bronchiectasis and some associated micronodular disease suspicious for possible mycobacterial colonization (never proven).  She has associated mild intermittent asthma, GERD. She had COVID in April, received mAb. She has had persistent exertional SOB. She is hearing some exertional exp wheeze. Her sx are not responding to albuterol. No real cough. She has probably had 5 lbs wt gain.   ROV 06/25/21 --65 year old woman with a history of intermittent asthma, GERD, bronchiectasis with some associated micronodular disease suspicious for mycobacterial colonization (never proven by culture).  She had COVID-19 in April 2022.  She is had persistent exertional shortness of breath since then.  Also cough, on PPI for her GERD.  We repeated her pulmonary function testing today as below, high-resolution CT chest.  Currently on albuterol as needed.  Pulmonary function testing performed today and reviewed by me, shows moderate decrease in FEV1, spirometry consistent with mixed restriction and obstruction.  Her lung volumes are normal.  Her diffusion capacity is decreased and corrects to the normal range when adjusted for alveolar volume.   High-resolution CT scan of the chest 05/29/2021 reviewed by me shows some scattered areas of bronchiectasis and clustered nodules, waxing and waning.  Moderate hiatal hernia.  No evidence of interstitial lung disease.   Past Medical History:  Diagnosis Date   Allergy    Coronary atherosclerosis due to calcified coronary lesion    GERD (gastroesophageal reflux disease)    Heartburn    High blood pressure    Overactive  bladder     Objective:   Physical Exam Wt Readings from Last 3 Encounters:  06/25/21 183 lb 3.2 oz (83.1 kg)  05/21/21 182 lb 12.8 oz (82.9 kg)  08/30/20 171 lb 1.6 oz (77.6 kg)   Vitals:   06/25/21 1606  BP: 128/68  Pulse: 86  Temp: 97.9 F (36.6 C)  TempSrc: Oral  SpO2: 96%  Weight: 183 lb 3.2 oz (83.1 kg)  Height: 5\' 4"  (1.626 m)    Gen: Pleasant, well-nourished, in no distress,  normal affect  ENT: No lesions,  mouth clear,  oropharynx clear, no postnasal drip  Neck: No JVD, no stridor  Lungs: No use of accessory muscles, clear without rales or rhonchi  Cardiovascular: RRR, heart sounds normal, no murmur or gallops, no peripheral edema  Musculoskeletal: No deformities, no cyanosis or clubbing  Neuro: alert, non focal  Skin: Warm, no lesions or rashes  Exposures > has lived in [place] and [place], she has owned dogs, cats, never birds, no mold exposures Has worked in a Education officer, community, 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 No history of auto-immune disease. Distant relative had SLE.  She was dx with PNA and bronchitis frequently as a youngster.       Assessment & Plan:   Mild intermittent asthma Her FEV1 is slightly lower, consistent with mixed obstruction and restriction.  I like to try her on scheduled bronchodilator therapy to see if she gets any benefit.  I do not see any evidence of interstitial disease on her CT chest, question whether there may be a  contribution of upper airway irritation or even deconditioning, weight gain.   We will try starting Breztri 2 puffs twice a day.  Rinse and gargle after using this medication.  Keep track of whether it helps your overall breathing.  If so then we will continue it going forward. Keep your albuterol available to use 2 puffs if needed for shortness of breath, chest tightness, wheezing. Please continue your Nexium twice a day.  Take this 1 hour around food. Depending on how you are doing going  forward we may consider further evaluation of your lung inflammation.  This could include repeat bronchoscopy. Up with APP in 1 month to discuss how you have been doing on the new medication Follow with Dr Lamonte Sakai in 4 months or sooner if you have any problems.  Bronchiectasis without complication (HCC) No real progression of her bronchiectasis.  No interstitial disease appears to be evolving.  She does have micronodular disease and tree-in-bud changes that suggest atypical mycobacterial infection.  She had bronchoscopy remotely that was negative for AFB.  It may be reasonable to consider repeating her bronchoscopy going forward since she is symptomatic.  We can discuss depending on how she responds to the [place].   Baltazar Apo, MD, PhD 06/25/2021, 4:33 PM  [facility] [phone] or if no answer [phone]

## 2021-06-25 NOTE — Progress Notes (Signed)
PFT done toay.

## 2021-06-25 NOTE — Addendum Note (Signed)
Addended by: Fran Lowes on: 06/25/2021 04:46 PM   Modules accepted: Orders

## 2021-06-25 NOTE — Assessment & Plan Note (Signed)
No real progression of her bronchiectasis.  No interstitial disease appears to be evolving.  She does have micronodular disease and tree-in-bud changes that suggest atypical mycobacterial infection.  She had bronchoscopy remotely that was negative for AFB.  It may be reasonable to consider repeating her bronchoscopy going forward since she is symptomatic.  We can discuss depending on how she responds to the [place].

## 2021-06-25 NOTE — Assessment & Plan Note (Signed)
Her FEV1 is slightly lower, consistent with mixed obstruction and restriction.  I like to try her on scheduled bronchodilator therapy to see if she gets any benefit.  I do not see any evidence of interstitial disease on her CT chest, question whether there may be a contribution of upper airway irritation or even deconditioning, weight gain.   We will try starting Breztri 2 puffs twice a day.  Rinse and gargle after using this medication.  Keep track of whether it helps your overall breathing.  If so then we will continue it going forward. Keep your albuterol available to use 2 puffs if needed for shortness of breath, chest tightness, wheezing. Please continue your Nexium twice a day.  Take this 1 hour around food. Depending on how you are doing going forward we may consider further evaluation of your lung inflammation.  This could include repeat bronchoscopy. Up with APP in 1 month to discuss how you have been doing on the new medication Follow with Dr Lamonte Sakai in 4 months or sooner if you have any problems.

## 2021-06-25 NOTE — Patient Instructions (Addendum)
We will try starting Breztri 2 puffs twice a day.  Rinse and gargle after using this medication.  Keep track of whether it helps your overall breathing.  If so then we will continue it going forward. Keep your albuterol available to use 2 puffs if needed for shortness of breath, chest tightness, wheezing. Please continue your Nexium twice a day.  Take this 1 hour around food. Depending on how you are doing going forward we may consider further evaluation of your lung inflammation.  This could include repeat bronchoscopy. Up with APP in 1 month to discuss how you have been doing on the new medication Follow with Dr Lamonte Sakai in 4 months or sooner if you have any problems.

## 2021-07-04 ENCOUNTER — Telehealth: Payer: Self-pay | Admitting: Emergency Medicine

## 2021-07-04 NOTE — Telephone Encounter (Signed)
Yes - agree with stopping the [facility] She can go back to her prior inhaler regimen.

## 2021-07-04 NOTE — Telephone Encounter (Signed)
Call made to patient, confirmed DOB. Patient reports she is having really bad blinding headaches that mess with her vision since starting [place]. Her BP is stable but her heart rate is running in the 90's. Her normal is 70 with medication. She has taken tylenol and motrin and they help but do not stop the headache. She is wanting to know if she can stop the [place] all together. I told her not to use the Breztri anymore until she hears back from [place]. Voiced understanding.   RB please advise any recommendations. Thanks :)

## 2021-07-04 NOTE — Telephone Encounter (Signed)
Spoke with the pt and notified of response per RB  She verbalized understanding  I updated med and allergy list  Nothing further needed

## 2021-07-05 ENCOUNTER — Encounter (HOSPITAL_COMMUNITY): Payer: Self-pay | Admitting: Emergency Medicine

## 2021-07-05 ENCOUNTER — Other Ambulatory Visit: Payer: Self-pay

## 2021-07-05 ENCOUNTER — Emergency Department (HOSPITAL_COMMUNITY)
Admission: EM | Admit: 2021-07-05 | Discharge: 2021-07-05 | Disposition: A | Discharge status: Home / Self Care | Payer: Medicare Other | Attending: Emergency Medicine | Admitting: Emergency Medicine

## 2021-07-05 ENCOUNTER — Emergency Department (HOSPITAL_COMMUNITY): Payer: Medicare Other

## 2021-07-05 DIAGNOSIS — R519 Headache, unspecified: Secondary | ICD-10-CM | POA: Diagnosis present

## 2021-07-05 DIAGNOSIS — J45909 Unspecified asthma, uncomplicated: Secondary | ICD-10-CM | POA: Diagnosis not present

## 2021-07-05 DIAGNOSIS — I2510 Atherosclerotic heart disease of native coronary artery without angina pectoris: Secondary | ICD-10-CM | POA: Diagnosis not present

## 2021-07-05 DIAGNOSIS — Z7982 Long term (current) use of aspirin: Secondary | ICD-10-CM | POA: Insufficient documentation

## 2021-07-05 DIAGNOSIS — E119 Type 2 diabetes mellitus without complications: Secondary | ICD-10-CM | POA: Diagnosis not present

## 2021-07-05 DIAGNOSIS — Z79899 Other long term (current) drug therapy: Secondary | ICD-10-CM | POA: Diagnosis not present

## 2021-07-05 DIAGNOSIS — Z91040 Latex allergy status: Secondary | ICD-10-CM | POA: Diagnosis not present

## 2021-07-05 DIAGNOSIS — Z87891 Personal history of nicotine dependence: Secondary | ICD-10-CM | POA: Insufficient documentation

## 2021-07-05 LAB — COMPREHENSIVE METABOLIC PANEL
ALT: 23 U/L (ref 0–44)
AST: 16 U/L (ref 15–41)
Albumin: 3.8 g/dL (ref 3.5–5.0)
Alkaline Phosphatase: 116 U/L (ref 38–126)
Anion gap: 8 (ref 5–15)
BUN: 24 mg/dL — ABNORMAL HIGH (ref 8–23)
CO2: 26 mmol/L (ref 22–32)
Calcium: 9.2 mg/dL (ref 8.9–10.3)
Chloride: 104 mmol/L (ref 98–111)
Creatinine, Ser: 0.9 mg/dL (ref 0.44–1.00)
GFR, Estimated: >60 mL/min (ref >60)
Glucose, Bld: 105 mg/dL — ABNORMAL HIGH (ref 70–99)
Potassium: 4.3 mmol/L (ref 3.5–5.1)
Sodium: 138 mmol/L (ref 135–145)
Total Bilirubin: 0.5 mg/dL (ref 0.3–1.2)
Total Protein: 7 g/dL (ref 6.5–8.1)

## 2021-07-05 LAB — CBC WITH DIFFERENTIAL/PLATELET
Abs Immature Granulocytes: 0.04 10*3/uL (ref 0.00–0.07)
Basophils Absolute: 0 10*3/uL (ref 0.0–0.1)
Basophils Relative: 1 %
Eosinophils Absolute: 0.4 10*3/uL (ref 0.0–0.5)
Eosinophils Relative: 5 %
HCT: 47.3 % — ABNORMAL HIGH (ref 36.0–46.0)
Hemoglobin: 15.9 g/dL — ABNORMAL HIGH (ref 12.0–15.0)
Immature Granulocytes: 1 %
Lymphocytes Relative: 32 %
Lymphs Abs: 2.6 10*3/uL (ref 0.7–4.0)
MCH: 30.5 pg (ref 26.0–34.0)
MCHC: 33.6 g/dL (ref 30.0–36.0)
MCV: 90.6 fL (ref 80.0–100.0)
Monocytes Absolute: 0.6 10*3/uL (ref 0.1–1.0)
Monocytes Relative: 8 %
Neutro Abs: 4.5 10*3/uL (ref 1.7–7.7)
Neutrophils Relative %: 53 %
Platelets: 249 10*3/uL (ref 150–400)
RBC: 5.22 MIL/uL — ABNORMAL HIGH (ref 3.87–5.11)
RDW: 13.4 % (ref 11.5–15.5)
WBC: 8.3 10*3/uL (ref 4.0–10.5)
nRBC: 0 % (ref 0.0–0.2)

## 2021-07-05 MED ORDER — DEXAMETHASONE SODIUM PHOSPHATE 10 MG/ML IJ SOLN
10.0000 mg | Freq: Once | INTRAMUSCULAR | Status: AC
  Administered 2021-07-05: 10 mg via INTRAVENOUS
  Filled 2021-07-05: qty 1

## 2021-07-05 MED ORDER — SODIUM CHLORIDE 0.9 % IV BOLUS
1000.0000 mL | Freq: Once | INTRAVENOUS | Status: AC
  Administered 2021-07-05: 1000 mL via INTRAVENOUS

## 2021-07-05 MED ORDER — DIPHENHYDRAMINE HCL 50 MG/ML IJ SOLN
25.0000 mg | Freq: Once | INTRAMUSCULAR | Status: AC
  Administered 2021-07-05: 25 mg via INTRAVENOUS
  Filled 2021-07-05: qty 1

## 2021-07-05 MED ORDER — IOHEXOL 350 MG/ML SOLN
100.0000 mL | Freq: Once | INTRAVENOUS | Status: AC | PRN
  Administered 2021-07-05: 100 mL via INTRAVENOUS

## 2021-07-05 NOTE — ED Provider Notes (Signed)
[facility] EMERGENCY DEPARTMENT Provider Note   CSN: [id number] Arrival date & time: 07/05/21  0411     History Chief Complaint  Patient presents with   Headache    [name] is a 65 y.o. female.  65 year old female that presents emerged part of headache.  Patient states she started a new inhaler recently which caused her to have palpitations and headaches but this headache is much worse than anything she is ever felt in her life.  Is behind her eyes and wraps around the left side of her head.  No neurologic changes.  She states that it woke her from sleep.  There was pretty severe and did not get any better over about an hour so she presented here for further evaluation.  No recent fevers or illnesses.  No nausea or vomiting.  No vision changes.  No other associated symptoms.   Headache     Past Medical History:  Diagnosis Date   Allergy    Coronary atherosclerosis due to calcified coronary lesion    GERD (gastroesophageal reflux disease)    Heartburn    High blood pressure    Overactive bladder     Patient Active Problem List   Diagnosis Date Noted   Dyspnea on exertion 05/21/2021   Mild intermittent asthma 06/30/2016   Diabetes mellitus ([place]) 05/07/2016   Chest pain 02/11/2015   Status post Nissen fundoplication 42/70/6237   Substernal chest pain 10/12/2014   Acid reflux disease 08/03/2014   Upper airway cough syndrome 02/09/2014   Bronchiectasis without complication ([place]) 62/83/1517   GERD 04/30/2010   ABDOMINAL PAIN RIGHT UPPER QUADRANT 04/30/2010    LATEX ALLERGY  04/30/2010    Past Surgical History:  Procedure Laterality Date   ABDOMINAL HYSTERECTOMY     CHOLECYSTECTOMY     COLONOSCOPY  2010   Dr. Aviva Signs: four polyps in rectum and sigmoid colon (hyperplastic polyps)   COLONOSCOPY N/A 02/07/2020   Procedure: COLONOSCOPY;  Surgeon: Daneil Dolin, MD;  Location: [facility];  Service: Endoscopy;  Laterality: N/A;  7:30    ESOPHAGOGASTRODUODENOSCOPY N/A 06/08/2014   SLF: 1. No obvious source for dyspepsia identified. 2. Medium sized hiatal hernia 3. Mild non-erosive gastritis.   ESOPHAGOGASTRODUODENOSCOPY  2010   Dr. Aviva Signs: hiatal hernia   EUS  05/21/10   outlaw:hyperechoic liver mild consistent with steatosis otherwise normal US   GALLBLADDER SURGERY  2004   HEMOSTASIS CLIP PLACEMENT  02/07/2020   Procedure: HEMOSTASIS CLIP PLACEMENT;  Surgeon: Daneil Dolin, MD;  Location: [facility];  Service: Endoscopy;;   LAPAROSCOPIC NISSEN FUNDOPLICATION N/A 61/60/7371   Procedure: LAPAROSCOPIC NISSEN FUNDOPLICATION;  Surgeon: Jamesetta So, MD;  Location: [facility] ORS;  Service: General;  Laterality: N/A;   POLYPECTOMY  02/07/2020   Procedure: POLYPECTOMY;  Surgeon: Daneil Dolin, MD;  Location: [facility];  Service: Endoscopy;;   TONSILLECTOMY     VIDEO BRONCHOSCOPY Bilateral 01/23/2014   Procedure: VIDEO BRONCHOSCOPY WITHOUT FLUORO;  Surgeon: Collene Gobble, MD;  Location: [facility];  Service: Cardiopulmonary;  Laterality: Bilateral;     OB History   No obstetric history on file.     Family History  Problem Relation Age of Onset   Heart disease Maternal Grandfather    Heart disease Paternal Grandmother    Cancer Mother        lymphoma   Allergies Mother    Allergies Brother    Heart disease Father    Colon cancer Neg  Hx     Social History   Tobacco Use   Smoking status: Former    Packs/day: 1.50    Years: 30.00    Pack years: 45.00    Types: Cigarettes    Quit date: 05/24/2008    Years since quitting: 13.1   Smokeless tobacco: Never   Tobacco comments:    quit x 6 years  Vaping Use   Vaping Use: Never used  Substance Use Topics   Alcohol use: No   Drug use: No    Home Medications Prior to Admission medications   Medication Sig Start Date End Date Taking? Authorizing Provider  albuterol (VENTOLIN HFA) 108 (90 Base) MCG/ACT inhaler Inhale 2 puffs into the lungs every 6 (six)  hours as needed for wheezing or shortness of breath. 05/01/20   Collene Gobble, MD  aspirin EC 81 MG tablet Take 81 mg by mouth daily. Swallow whole.    [provider]  cyclobenzaprine (FLEXERIL) 10 MG tablet Take 10 mg by mouth at bedtime.    [provider]  esomeprazole (NEXIUM) 20 MG packet Take 20 mg by mouth in the morning and at bedtime.    [provider]  irbesartan (AVAPRO) 150 MG tablet Take 1 tablet (150 mg total) by mouth daily. 08/30/20 08/30/21  Tolia, Sunit, DO  Melatonin 5 MG CAPS Take 5 mg by mouth at bedtime.    [provider]  rosuvastatin (CRESTOR) 10 MG tablet Take 1 tablet (10 mg total) by mouth at bedtime. 08/30/20 08/30/21  Tolia, Sunit, DO  trospium (SANCTURA) 20 MG tablet Take 20 mg by mouth at bedtime.    [provider]  verapamil (VERELAN PM) 240 MG 24 hr capsule Take 1 capsule (240 mg total) by mouth daily. 08/30/20 08/30/21  Rex Kras, DO    Allergies    Dexlansoprazole, Oxycodone-acetaminophen, Judithann Sauger aerosphere [budeson-glycopyrrol-formoterol], Percocet [oxycodone-acetaminophen], and Latex  Review of Systems   Review of Systems  Neurological:  Positive for headaches.  All other systems reviewed and are negative.  Physical Exam Updated Vital Signs BP (!) 147/80 (BP Location: Right Arm)   Pulse 69   Temp 97.8 F (36.6 C) (Oral)   Resp 16   Ht 5\' 4"  (1.626 m)   Wt 83 kg   SpO2 96%   BMI 31.41 kg/m   Physical Exam Vitals and nursing note reviewed.  Constitutional:      Appearance: She is well-developed.  HENT:     Head: Normocephalic and atraumatic.  Cardiovascular:     Rate and Rhythm: Normal rate and regular rhythm.  Pulmonary:     Effort: No respiratory distress.     Breath sounds: No stridor.  Abdominal:     General: There is no distension.  Musculoskeletal:     Cervical back: Normal range of motion. No rigidity.  Lymphadenopathy:     Cervical: No cervical adenopathy.  Neurological:     Mental  Status: She is alert and oriented to person, place, and time. Mental status is at baseline.     GCS: GCS eye subscore is 4. GCS verbal subscore is 5. GCS motor subscore is 6.     Cranial Nerves: No cranial nerve deficit, dysarthria or facial asymmetry.     Sensory: No sensory deficit.     Motor: No weakness.     Gait: Gait normal.    ED Results / Procedures / Treatments   Labs (all labs ordered are listed, but only abnormal results are displayed) Labs  Reviewed  CBC WITH DIFFERENTIAL/PLATELET - Abnormal; Notable for the following components:      Result Value   RBC 5.22 (*)    Hemoglobin 15.9 (*)    HCT 47.3 (*)    All other components within normal limits  COMPREHENSIVE METABOLIC PANEL - Abnormal; Notable for the following components:   Glucose, Bld 105 (*)    BUN 24 (*)    All other components within normal limits    EKG None  Radiology CT Angio Head W or Wo Contrast  Result Date: 07/05/2021 CLINICAL DATA:  Headache since Thursday EXAM: CT ANGIOGRAPHY HEAD TECHNIQUE: Multidetector CT imaging of the head was performed using the standard protocol during bolus administration of intravenous contrast. Multiplanar CT image reconstructions and MIPs were obtained to evaluate the vascular anatomy. CONTRAST:  177mL OMNIPAQUE IOHEXOL 350 MG/ML SOLN COMPARISON:  None similar FINDINGS: CT HEAD Brain: No evidence of acute infarction, hemorrhage, hydrocephalus, extra-axial collection or mass lesion/mass effect. Vascular: No hyperdense vessel or unexpected calcification. Skull: Normal. Negative for fracture or focal lesion. Sinuses: No acute or significant finding. CTA HEAD Anterior circulation: No significant stenosis, proximal occlusion, aneurysm, or vascular malformation. Atheromatous calcification of the carotid siphons. Posterior circulation: No significant stenosis, proximal occlusion, aneurysm, or vascular malformation. Venous sinuses: As permitted by contrast timing, patent. Anatomic  variants: None Review of the MIP images confirms the above findings. IMPRESSION: No acute finding or explanation for headache. Electronically Signed   By: Jorje Guild M.D.   On: 07/05/2021 07:11    Procedures Procedures   Medications Ordered in ED Medications  diphenhydrAMINE (BENADRYL) injection 25 mg (25 mg Intravenous Given 07/05/21 0522)  sodium chloride 0.9 % bolus 1,000 mL (0 mLs Intravenous Stopped 07/05/21 0648)  dexamethasone (DECADRON) injection 10 mg (10 mg Intravenous Given 07/05/21 0522)  iohexol (OMNIPAQUE) 350 MG/ML injection 100 mL (100 mLs Intravenous Contrast Given 07/05/21 0641)    ED Course  I have reviewed the triage vital signs and the nursing notes.  Pertinent labs & imaging results that were available during my care of the patient were reviewed by me and considered in my medical decision making (see chart for details).    MDM Rules/Calculators/A&P                         Sounds like a migraine but apparently is also on side effects of this inhaler she has been having.  We will go and treat her headache is a migraine.  We will get a CT angio as this is a new headache for her.  I suspect that this will probably be negative and if her headaches improve she can be discharged  CTA negative. HA almost totally gone. Did appear slightly dehydrated on her CBC so I suspect the fluids helpe more than anything.   Final Clinical Impression(s) / ED Diagnoses Final diagnoses:  Nonintractable headache, unspecified chronicity pattern, unspecified headache type    Rx / DC Orders ED Discharge Orders     None        Elianny Buxbaum, Corene Cornea, MD 07/06/21 0002

## 2021-07-05 NOTE — ED Triage Notes (Signed)
Pt with c/o headache since Thursday. States she spoke with her PCP and he recommended she stop a new inhaler she had been placed on and if it did not get any better, to seek care at ER.

## 2021-08-06 ENCOUNTER — Ambulatory Visit: Payer: 59

## 2021-08-06 ENCOUNTER — Other Ambulatory Visit: Payer: Self-pay

## 2021-08-06 DIAGNOSIS — I340 Nonrheumatic mitral (valve) insufficiency: Secondary | ICD-10-CM

## 2021-08-29 ENCOUNTER — Other Ambulatory Visit: Payer: Self-pay | Admitting: Emergency Medicine

## 2021-08-29 ENCOUNTER — Other Ambulatory Visit: Payer: Self-pay | Admitting: Cardiology

## 2021-08-29 DIAGNOSIS — I2510 Atherosclerotic heart disease of native coronary artery without angina pectoris: Secondary | ICD-10-CM

## 2021-08-29 DIAGNOSIS — I2584 Coronary atherosclerosis due to calcified coronary lesion: Secondary | ICD-10-CM

## 2021-09-01 ENCOUNTER — Ambulatory Visit: Payer: 59 | Admitting: Cardiology

## 2021-09-01 ENCOUNTER — Encounter: Payer: Self-pay | Admitting: Cardiology

## 2021-09-01 ENCOUNTER — Other Ambulatory Visit: Payer: Self-pay

## 2021-09-01 VITALS — BP 139/71 | HR 83 | Temp 97.2°F | Resp 16 | Ht 64.0 in | Wt 186.0 lb

## 2021-09-01 DIAGNOSIS — E7800 Pure hypercholesterolemia, unspecified: Secondary | ICD-10-CM

## 2021-09-01 DIAGNOSIS — I4510 Unspecified right bundle-branch block: Secondary | ICD-10-CM

## 2021-09-01 DIAGNOSIS — I2510 Atherosclerotic heart disease of native coronary artery without angina pectoris: Secondary | ICD-10-CM

## 2021-09-01 DIAGNOSIS — Z87891 Personal history of nicotine dependence: Secondary | ICD-10-CM

## 2021-09-01 DIAGNOSIS — I10 Essential (primary) hypertension: Secondary | ICD-10-CM

## 2021-09-01 DIAGNOSIS — I493 Ventricular premature depolarization: Secondary | ICD-10-CM

## 2021-09-01 NOTE — Progress Notes (Signed)
[name] Date of Birth: October 10, 1955 MRN: [id number] Primary Care Provider:Tisovec, Fransico Him, MD Former Cardiology Providers: Jeri Lager, APRN, FNP-C Primary Cardiologist: Rex Kras, DO, [facility] (established care August 30, 2020)  Date: 09/01/21 Last Office Visit: 01/107/2022  Chief Complaint  Patient presents with   Coronary artery calcification   Hypertension   Hyperlipidemia   Follow-up    HPI  [name] is a 66 y.o.  female who presents to the office with a chief complaint of " 1 year follow-up for blood pressure, tachycardia, and hx of coronary artery calcification." Patient's past medical history and cardiovascular risk factors include: History of tachycardia, Hx of COVID x2, hypertension, hyperlipidemia, coronary artery atherosclerosis due to calcified plaque, mitral regurgitation, postmenopausal female, advanced age.  Patient has been followed by the practice for tachycardia and coronary artery calcification.  She presents for 1 year follow-up.  From a cardiovascular standpoint she remains relatively stable.  However, in November 2022 she had an episode of shingles affecting her eyes and face.  The pain has significantly improved since then.  She also had COVID-19 infection in December 2022 and is recovering well.  She did not need to be hospitalized.  From a cardiovascular standpoint her shortness of breath is chronic and stable slightly worse after her recent COVID infection.  She denies any anginal discomfort, orthopnea, paroxysmal nocturnal dyspnea or lower extremity swelling.  No hospitalizations or urgent care visits since last office encounter.  She is currently on verapamil and her heart rates are in the 80s EKG notes PVCs.  She is asymptomatic regarding them.  FUNCTIONAL STATUS: Use to walk 1-2 miles 3-4 times per week; limited now.   ALLERGIES: Allergies  Allergen Reactions   Dexlansoprazole Diarrhea, Other (See Comments) and Nausea And Vomiting     N/V/D   Oxycodone-Acetaminophen Other (See Comments)    extreme headache   Breztri Aerosphere [Budeson-Glycopyrrol-Formoterol]     Severe headache and visual changes   Percocet [Oxycodone-Acetaminophen]     Vomit and extreme headache   Latex Rash     MEDICATION LIST PRIOR TO VISIT: Current Outpatient Medications on File Prior to Visit  Medication Sig Dispense Refill   albuterol (VENTOLIN HFA) 108 (90 Base) MCG/ACT inhaler TAKE 2 PUFFS BY MOUTH EVERY 6 HOURS AS NEEDED FOR WHEEZE OR SHORTNESS OF BREATH 8.5 each 11   aspirin EC 81 MG tablet Take 81 mg by mouth daily. Swallow whole.     Cholecalciferol (VITAMIN D3) 1.25 MG (50000 UT) CAPS Take by mouth.     cyclobenzaprine (FLEXERIL) 10 MG tablet Take 10 mg by mouth at bedtime.     esomeprazole (NEXIUM) 20 MG packet Take 20 mg by mouth in the morning and at bedtime.     irbesartan (AVAPRO) 150 MG tablet Take 1 tablet (150 mg total) by mouth daily. 90 tablet 3   Melatonin 5 MG CAPS Take 5 mg by mouth at bedtime.     rosuvastatin (CRESTOR) 10 MG tablet TAKE 1 TABLET BY MOUTH EVERYDAY AT BEDTIME 90 tablet 3   trospium (SANCTURA) 20 MG tablet Take 20 mg by mouth at bedtime.     verapamil (VERELAN PM) 240 MG 24 hr capsule Take 1 capsule (240 mg total) by mouth daily. 90 capsule 3   No current facility-administered medications on file prior to visit.    PAST MEDICAL HISTORY: Past Medical History:  Diagnosis Date   Allergy    Coronary atherosclerosis due to calcified coronary lesion  GERD (gastroesophageal reflux disease)    Heartburn    High blood pressure    History of COVID-19    Overactive bladder     PAST SURGICAL HISTORY: Past Surgical History:  Procedure Laterality Date   ABDOMINAL HYSTERECTOMY     CHOLECYSTECTOMY     COLONOSCOPY  2010   Dr. Aviva Signs: four polyps in rectum and sigmoid colon (hyperplastic polyps)   COLONOSCOPY N/A 02/07/2020   Procedure: COLONOSCOPY;  Surgeon: Daneil Dolin, MD;  [facility];  Service: Endoscopy;  Laterality: N/A;  7:30   ESOPHAGOGASTRODUODENOSCOPY N/A 06/08/2014   SLF: 1. No obvious source for dyspepsia identified. 2. Medium sized hiatal hernia 3. Mild non-erosive gastritis.   ESOPHAGOGASTRODUODENOSCOPY  2010   Dr. Aviva Signs: hiatal hernia   EUS  05/21/10   outlaw:hyperechoic liver mild consistent with steatosis otherwise normal US   GALLBLADDER SURGERY  2004   HEMOSTASIS CLIP PLACEMENT  02/07/2020   Procedure: HEMOSTASIS CLIP PLACEMENT;  Surgeon: Daneil Dolin, MD;  [facility];  Service: Endoscopy;;   LAPAROSCOPIC NISSEN FUNDOPLICATION N/A 38/25/0539   Procedure: LAPAROSCOPIC NISSEN FUNDOPLICATION;  Surgeon: Jamesetta So, MD;  Location: [facility];  Service: General;  Laterality: N/A;   POLYPECTOMY  02/07/2020   Procedure: POLYPECTOMY;  Surgeon: Daneil Dolin, MD;  [facility];  Service: Endoscopy;;   TONSILLECTOMY     VIDEO BRONCHOSCOPY Bilateral 01/23/2014   Procedure: VIDEO BRONCHOSCOPY WITHOUT FLUORO;  Surgeon: Collene Gobble, MD;  Location: [facility];  Service: Cardiopulmonary;  Laterality: Bilateral;    FAMILY HISTORY: The patient's family history includes Allergies in her brother and mother; Cancer in her mother; Heart disease in her father, maternal grandfather, and paternal grandmother.   SOCIAL HISTORY:  The patient  reports that she quit smoking about 13 years ago. Her smoking use included cigarettes. She has a 45.00 pack-year smoking history. She has never used smokeless tobacco. She reports that she does not drink alcohol and does not use drugs.  Review of Systems  Constitutional: Negative for chills and fever.  HENT:  Negative for hoarse voice and nosebleeds.   Eyes:  Negative for discharge, double vision and pain.  Cardiovascular:  Negative for chest pain, claudication, dyspnea on exertion, leg swelling, near-syncope, orthopnea, palpitations, paroxysmal nocturnal dyspnea and syncope.  Respiratory:  Positive  for shortness of breath (Chronic and slightly more sinceCOVID). Negative for hemoptysis.   Musculoskeletal:  Negative for muscle cramps and myalgias.  Gastrointestinal:  Negative for abdominal pain, constipation, diarrhea, hematemesis, hematochezia, melena, nausea and vomiting.  Neurological:  Negative for dizziness and light-headedness.   PHYSICAL EXAM: Vitals with BMI 09/01/2021 07/05/2021 07/05/2021  Height 5' 4"  - -  Weight 186 lbs - -  BMI 76.73 - -  Systolic 419 379 024  Diastolic 71 80 66  Pulse 83 69 64   CONSTITUTIONAL: Well-developed and well-nourished. No acute distress.  SKIN: Skin is warm and dry. No rash noted. No cyanosis. No pallor. No jaundice HEAD: Normocephalic and atraumatic.  EYES: No scleral icterus MOUTH/THROAT: Moist oral membranes.  NECK: No JVD present. No thyromegaly noted. No carotid bruits  LYMPHATIC: No visible cervical adenopathy.  CHEST Normal respiratory effort. No intercostal retractions  LUNGS: Clear to auscultation bilaterally. No stridor. No wheezes. No rales.  CARDIOVASCULAR: Regular rate and rhythm, positive O9-B3, soft holosystolic murmur heard at the apex, no rubs or gallops appreciated. ABDOMINAL: Obese, soft, nontender, nondistended, positive bowel sounds all 4 quadrants. No apparent ascites.  EXTREMITIES: No peripheral edema  HEMATOLOGIC: No significant bruising NEUROLOGIC: Oriented to person, place, and time. Nonfocal. Normal muscle tone.  PSYCHIATRIC: Normal mood and affect. Normal behavior. Cooperative  RADIOLOGY: CT chest high resolution 06/24/2018: 1. New clustered nodularity/tree-in-bud opacities in the medial basilar right upper lobe and peripheral left upper lobe with dominant new 6 mm peripheral left upper lobe nodule. While probably inflammatory related to progression of MAI given the below findings, non-contrast chest CT at 3-6 months is recommended in this high-risk patient with smoking related changes in the lungs. This  recommendation follows the consensus statement: Guidelines for Management of Incidental Pulmonary Nodules Detected on CT Images: From the Fleischner Society 2017; Radiology 2017; 284:228-243. 2. 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 3. Moderate emphysema with mild diffuse bronchial wall thickening, suggesting COPD. 4. Small hiatal hernia. Fluid in the thoracic esophagus, suggesting esophageal dysmotility and/or gastroesophageal reflux. Nonspecific mild circumferential wall thickening in the lower thoracic esophagus, stable, most commonly due to reflux esophagitis, with Barrett's esophagus or neoplasm not excluded. 5. One vessel coronary atherosclerosis. Aortic Atherosclerosis (ICD10-I70.0) and Emphysema (ICD10-J43.9  CARDIAC DATABASE: EKG: 08/30/2020: Sinus  Rhythm, 71bpm, RBBB, without underlying injury pattern.  09/01/2021: NSR, 89 bpm, right bundle branch block, PVCs.    Echocardiogram: 08/06/2021: Normal LV systolic function with visual EF 60-65%. Left ventricle cavity is normal in size. Normal left ventricular wall thickness. Normal global wall motion. Normal diastolic filling pattern, normal LAP.  Trace tricuspid regurgitation. No evidence of pulmonary hypertension. Insignificant pericardial effusion. There is no hemodynamic significance. Compared to study 07/22/2016 LVEF remains stable, MR and TR have improved, otherwise no significant change.   Stress Testing:  Nuclear stress test  [08/06/2016]: 1. The resting electrocardiogram demonstrated normal sinus rhythm, RBBB, no resting arrhythmias and normal rest repolarization. The stress electrocardiogram was normal. Patient exercised on Bruce protocol for 7:00 minutes and achieved 7.05 METS. Stress test terminated due to dyspnea and 91% MPHR achieved (Target HR >85%). Hypertensive Bp response  (200/98 mm Hg peak). 2. Myocardial perfusion imaging is normal. Overall left ventricular systolic function was normal without regional wall motion abnormalities. The left ventricular ejection fraction was 71%.  Heart Catheterization: None  LABORATORY DATA: External Labs: Collected: February 13, 2020 Creatinine 0.8 mg/dL. eGFR: 72 mL/min per 1.73 m Potassium 4.7  Hemoglobin 16 g/dL, hematocrit 49.9% Lipid profile: Total cholesterol 153, triglycerides 93, HDL 55, LDL 79, non HDL 98 Hemoglobin A1c: 5.8%  External labs: Collected 04/01/2021. Available in Care Everywhere. COMPLETE METABOLIC   [id number]    GLUCOSE 107   60 - 110  BUN 26   5 - 23  CREATININE 1.0   0.3 - 1.5  eGFR Non-African American 55.5   <  eGFR African American 67.1   <  SODIUM 142   135 - 148  POTASSIUM 4.8   3.5 - 5.3  CHLORIDE 106   80 - 111  CO2 24   15 - 35  CALCIUM 9.0   7.0 - 10.5  TOTAL PROTEIN 6.4   6.0 - 8.5  ALBUMIN 3.7   2.0 - 5.5  AST 16   7 - 45  ALT 25   5 - 40  ALK PHOS 97   37 - 137  TOTAL BILIRUBIN 0.5   0.0 - 1.5  CBC   2021-04-01    WBC 9.95   4.10 - 10.90  LYM 3.0   0.6 -  4.1  BASO% 0.8   0.0 - 2.0  EOS 0.4   0.0 - 0.4  BASO 0.1   0.0 - 0.2  EOS% 4.5   0.0 - 7.8  LYM% 29.7   10.0 - 58.5  RBC 5.2   4.2 - 6.3  HGB 15.1   12.0 - 18.0  HCT 46.3   37.0 - 51.0  MCV 89.5   80.0 - 97.0  MCH 29.1   26.0 - 32.0  MCHC 32.5   31.0 - 36.0  RDW 12.7   12.3 - 15.4  PLT 241   140 - 440  MPV 7.3   6.9 - 10.6  NEU 5.6   1.4 - 7.0  MONO% 8.5   4.6 - 12.4  MONO 0.8   0.1 - 0.9  NEU% 56.5   43.3 - 71.9  LIPID   2021-04-01    CHOLESTEROL 152   100 - 200  TRIGLYCERIDES 109   30 - 149  HDL 58   40 - 60  LDL 72   <  CHOL/HDL RATIO 2.6   <  LDL/HDL RATIO 1.2   1.7 - 2.5  NON-HDL 94.0   <  A1C   2021-04-01    A1C 6.2      IMPRESSION:    ICD-10-CM   1. PVC (premature ventricular contraction)  I49.3 LONG TERM MONITOR (3-14 DAYS)    2. Coronary atherosclerosis due to calcified coronary lesion  of native artery  I25.10 EKG 12-Lead   I25.84     3. Right bundle branch block  I45.10     4. Pure hypercholesterolemia  E78.00     5. Essential hypertension  I10     6. Former smoker  Z87.891        RECOMMENDATIONS: DENISSA COZART is a 66 y.o. female whose past medical history and cardiovascular risk factors include: tachycardia, hypertension, hyperlipidemia, coronary artery atherosclerosis due to calcified plaque, mitral regurgitation, postmenopausal female, advanced age.  PVC (premature ventricular contraction) Asymptomatic. Noted on surface ECG. Likely secondary to recovering from COVID-19 infection. Currently on verapamil. We will repeat a 3-day extended Holter monitor in March 2023 prior to her office visit in April 2023 to reevaluate her PVC burden/dysrhythmias.  Coronary atherosclerosis due to calcified coronary lesion of native artery Continue aspirin and statin therapy. Most recent echocardiogram results reviewed from December 2022.  LVEF preserved, normal diastolic function, no significant valvular heart disease. EKG sinus rhythm with PVCs and right bundle branch block.  Pure hypercholesterolemia Currently on rosuvastatin.   She denies myalgia or other side effects. Most recent lipids dated 03/2021 reviewed as noted above. Currently managed by primary care provider.  Essential hypertension Office blood pressures within acceptable range. Medications reconciled. Reemphasized the importance of low-salt diet. Currently managed by primary care provider.  Former smoker Educated on the importance of continued smoking cessation.  FINAL MEDICATION LIST END OF ENCOUNTER: No orders of the defined types were placed in this encounter.   Medications Discontinued During This Encounter  Medication Reason   0.9 %  sodium chloride infusion      Current Outpatient Medications:    albuterol (VENTOLIN HFA) 108 (90 Base) MCG/ACT inhaler, TAKE 2 PUFFS BY MOUTH EVERY 6 HOURS AS  NEEDED FOR WHEEZE OR SHORTNESS OF BREATH, Disp: 8.5 each, Rfl: 11   aspirin EC 81 MG tablet, Take 81 mg by mouth daily. Swallow whole., Disp: , Rfl:    Cholecalciferol (VITAMIN D3) 1.25 MG (50000 UT) CAPS, Take by  mouth., Disp: , Rfl:    cyclobenzaprine (FLEXERIL) 10 MG tablet, Take 10 mg by mouth at bedtime., Disp: , Rfl:    esomeprazole (NEXIUM) 20 MG packet, Take 20 mg by mouth in the morning and at bedtime., Disp: , Rfl:    irbesartan (AVAPRO) 150 MG tablet, Take 1 tablet (150 mg total) by mouth daily., Disp: 90 tablet, Rfl: 3   Melatonin 5 MG CAPS, Take 5 mg by mouth at bedtime., Disp: , Rfl:    rosuvastatin (CRESTOR) 10 MG tablet, TAKE 1 TABLET BY MOUTH EVERYDAY AT BEDTIME, Disp: 90 tablet, Rfl: 3   trospium (SANCTURA) 20 MG tablet, Take 20 mg by mouth at bedtime., Disp: , Rfl:    verapamil (VERELAN PM) 240 MG 24 hr capsule, Take 1 capsule (240 mg total) by mouth daily., Disp: 90 capsule, Rfl: 3  Orders Placed This Encounter  Procedures   LONG TERM MONITOR (3-14 DAYS)   EKG 12-Lead   --Continue cardiac medications as reconciled in final medication list. --Return in about 3 months (around 11/30/2021). Or sooner if needed. --Continue follow-up with your primary care physician regarding the management of your other chronic comorbid conditions.  Patient's questions and concerns were addressed to her satisfaction. She voices understanding of the instructions provided during this encounter.   This note was created using a voice recognition software as a result there may be grammatical errors inadvertently enclosed that do not reflect the nature of this encounter. Every attempt is made to correct such errors.  Rex Kras, [place], [facility]  Pager: [phone] Office: [phone]

## 2021-10-20 ENCOUNTER — Other Ambulatory Visit (HOSPITAL_COMMUNITY): Payer: Self-pay | Admitting: Internal Medicine

## 2021-10-20 DIAGNOSIS — Z1231 Encounter for screening mammogram for malignant neoplasm of breast: Secondary | ICD-10-CM

## 2021-10-28 ENCOUNTER — Encounter: Payer: Self-pay | Admitting: Emergency Medicine

## 2021-10-28 ENCOUNTER — Other Ambulatory Visit: Payer: Self-pay

## 2021-10-28 ENCOUNTER — Ambulatory Visit: Payer: Medicare Other | Admitting: Emergency Medicine

## 2021-10-28 DIAGNOSIS — J4520 Mild intermittent asthma, uncomplicated: Secondary | ICD-10-CM | POA: Diagnosis not present

## 2021-10-28 DIAGNOSIS — J479 Bronchiectasis, uncomplicated: Secondary | ICD-10-CM | POA: Diagnosis not present

## 2021-10-28 DIAGNOSIS — K219 Gastro-esophageal reflux disease without esophagitis: Secondary | ICD-10-CM | POA: Diagnosis not present

## 2021-10-28 NOTE — Assessment & Plan Note (Signed)
Continue your Nexium twice a day. ?

## 2021-10-28 NOTE — Patient Instructions (Addendum)
We will retry Breztri 2 puffs twice a day.  Rinse and gargle after using.  Keep track of whether it helps you so we can decide whether to continue it going forward.  Please call us and let us know how you have done on the medication. ?Keep albuterol available to use 2 puffs up to every 4 hours if needed for shortness of breath, chest tightness, wheezing.  ?Continue your Nexium twice a day. ?We will hold off on scheduling a repeat CT scan of the chest for now.  We can discuss the timing of any repeat imaging going forward depending on how your symptoms are doing. ?Follow with Dr Lamonte Sakai in 6 months or sooner if you have any problems ? ?

## 2021-10-28 NOTE — Progress Notes (Signed)
? ?Subjective:  ? ?Patient ID: [name], female    DOB: 1956-07-02    MRN: [id number] ? ?Brief patient profile:  ? ?Spirometry 2012 at [facility] > AFL especially small airways. ?FOB was performed 01/23/14 > all cx data negative.   ?All auto-immune labs negative (ANCA, ANA, RF, ascl70, SSA/B).   ? ?ROV 06/25/21 --66 year old woman with a history of intermittent asthma, GERD, bronchiectasis with some associated micronodular disease suspicious for mycobacterial colonization (never proven by culture).  She had COVID-19 in April 2022.  She is had persistent exertional shortness of breath since then.  Also cough, on PPI for her GERD.  We repeated her pulmonary function testing today as below, high-resolution CT chest.  Currently on albuterol as needed. ? ?Pulmonary function testing performed today and reviewed by me, shows moderate decrease in FEV1, spirometry consistent with mixed restriction and obstruction.  Her lung volumes are normal.  Her diffusion capacity is decreased and corrects to the normal range when adjusted for alveolar volume.  ? ?High-resolution CT scan of the chest 05/29/2021 reviewed by me shows some scattered areas of bronchiectasis and clustered nodules, waxing and waning.  Moderate hiatal hernia.  No evidence of interstitial lung disease. ? ? ?ROV 10/28/21 --66 year old woman whom I have followed for bronchiectasis and intermittent asthma.  She also has a hiatal hernia with GERD.  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 ?I tried starting her on Breztri in November 2022, had to stopped due to headaches > turned out to be developing shingles. She has exertional SOB, she takes a walk every day. Hears some wheeze. Has some SOB and hears some wheeze when working in her flower beds ?She is on Nexium twice daily. She has very little cough.  ? ? ?Past Medical History:  ?Diagnosis Date  ? Allergy   ? Coronary atherosclerosis due to calcified  coronary lesion   ? GERD (gastroesophageal reflux disease)   ? Heartburn   ? High blood pressure   ? History of COVID-19   ? Overactive bladder   ? ? ?Objective:  ? Physical Exam ?Wt Readings from Last 3 Encounters:  ?10/28/21 182 lb 9.6 oz (82.8 kg)  ?09/01/21 186 lb (84.4 kg)  ?07/05/21 182 lb 15.7 oz (83 kg)  ? ?Vitals:  ? 10/28/21 0915  ?BP: 128/76  ?Pulse: 89  ?Temp: 97.8 ?F (36.6 ?C)  ?TempSrc: Oral  ?SpO2: 96%  ?Weight: 182 lb 9.6 oz (82.8 kg)  ?Height: '5\' 4"'$  (1.626 m)  ? ? ? ?Gen: Pleasant, well-nourished, in no distress,  normal affect ? ?ENT: No lesions,  mouth clear,  oropharynx clear, no postnasal drip ? ?Neck: No JVD, no stridor ? ?Lungs: No use of accessory muscles, clear without rales or rhonchi ? ?Cardiovascular: RRR, heart sounds normal, no murmur or gallops, no peripheral edema ? ?Musculoskeletal: No deformities, no cyanosis or clubbing ? ?Neuro: alert, non focal ? ?Skin: Warm, no lesions or rashes ? 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 No history of auto-immune disease. Distant relative had SLE.  She was dx with PNA and bronchitis frequently as a youngster.  ? ? ?   ?Assessment & Plan:  ? Mild intermittent asthma ?We will retry Breztri 2 puffs twice a day.  Rinse and gargle after using.  Keep track  of whether it helps you so we can decide whether to continue it going forward.  Please call us and let us know how you have done on the medication. ?Keep albuterol available to use 2 puffs up to every 4 hours if needed for shortness of breath, chest tightness, wheezing.  ?Follow with Dr Lamonte Sakai in 6 months or sooner if you have any problems ? ?Bronchiectasis without complication ([place]) ?We will hold off on scheduling a repeat CT scan of the chest for now.  We can discuss the timing of any repeat imaging going forward depending on how your symptoms are  doing. ? ?GERD ?Continue your Nexium twice a day. ? ? ? ?Baltazar Apo, MD, PhD ?10/28/2021, 9:40 AM ?[facility] ?[phone] or if no answer [phone] ? ? ? ?

## 2021-10-28 NOTE — Assessment & Plan Note (Signed)
We will hold off on scheduling a repeat CT scan of the chest for now.  We can discuss the timing of any repeat imaging going forward depending on how your symptoms are doing. ?

## 2021-10-28 NOTE — Assessment & Plan Note (Signed)
We will retry Breztri 2 puffs twice a day.  Rinse and gargle after using.  Keep track of whether it helps you so we can decide whether to continue it going forward.  Please call us and let us know how you have done on the medication. ?Keep albuterol available to use 2 puffs up to every 4 hours if needed for shortness of breath, chest tightness, wheezing.  ?Follow with Dr Lamonte Sakai in 6 months or sooner if you have any problems ?

## 2021-11-03 ENCOUNTER — Other Ambulatory Visit: Payer: Self-pay

## 2021-11-03 ENCOUNTER — Other Ambulatory Visit (HOSPITAL_COMMUNITY): Payer: Self-pay | Admitting: Internal Medicine

## 2021-11-03 ENCOUNTER — Ambulatory Visit (HOSPITAL_COMMUNITY)
Admission: RE | Admit: 2021-11-03 | Discharge: 2021-11-03 | Disposition: A | Discharge status: Home / Self Care | Payer: Medicare Other | Source: Ambulatory Visit | Attending: Internal Medicine | Admitting: Internal Medicine

## 2021-11-03 DIAGNOSIS — Z1231 Encounter for screening mammogram for malignant neoplasm of breast: Secondary | ICD-10-CM | POA: Diagnosis present

## 2021-11-05 ENCOUNTER — Other Ambulatory Visit (HOSPITAL_COMMUNITY): Payer: Self-pay | Admitting: Internal Medicine

## 2021-11-05 DIAGNOSIS — R928 Other abnormal and inconclusive findings on diagnostic imaging of breast: Secondary | ICD-10-CM

## 2021-11-11 ENCOUNTER — Other Ambulatory Visit: Payer: Self-pay

## 2021-11-11 ENCOUNTER — Ambulatory Visit (HOSPITAL_COMMUNITY)
Admission: RE | Admit: 2021-11-11 | Discharge: 2021-11-11 | Disposition: A | Discharge status: Home / Self Care | Payer: 59 | Source: Ambulatory Visit | Attending: Internal Medicine | Admitting: Internal Medicine

## 2021-11-11 ENCOUNTER — Ambulatory Visit (HOSPITAL_COMMUNITY)
Admission: RE | Admit: 2021-11-11 | Discharge: 2021-11-11 | Disposition: A | Discharge status: Home / Self Care | Payer: Medicare Other | Source: Ambulatory Visit | Attending: Internal Medicine | Admitting: Internal Medicine

## 2021-11-11 DIAGNOSIS — R928 Other abnormal and inconclusive findings on diagnostic imaging of breast: Secondary | ICD-10-CM

## 2021-11-12 ENCOUNTER — Inpatient Hospital Stay: Payer: Medicare Other

## 2021-11-18 ENCOUNTER — Telehealth: Payer: Self-pay | Admitting: Emergency Medicine

## 2021-11-18 ENCOUNTER — Other Ambulatory Visit: Payer: Self-pay

## 2021-11-18 ENCOUNTER — Inpatient Hospital Stay: Payer: Medicare Other

## 2021-11-18 MED ORDER — BREZTRI AEROSPHERE 160-9-4.8 MCG/ACT IN AERO: 2.0000 | INHALATION_SPRAY | Freq: Two times a day (BID) | RESPIRATORY_TRACT | 11 refills | Status: DC

## 2021-11-18 NOTE — Telephone Encounter (Signed)
Spoke to patient. She is requesting Rx for [facility], as she feels this medication is effective.  ?Rx sent top referred pharmacy. ?Nothing further needed.  ? ?

## 2021-11-23 ENCOUNTER — Other Ambulatory Visit: Payer: Self-pay | Admitting: Cardiology

## 2021-11-23 DIAGNOSIS — I10 Essential (primary) hypertension: Secondary | ICD-10-CM

## 2021-11-23 DIAGNOSIS — I4510 Unspecified right bundle-branch block: Secondary | ICD-10-CM

## 2021-11-24 ENCOUNTER — Other Ambulatory Visit: Payer: Self-pay | Admitting: Cardiology

## 2021-11-24 DIAGNOSIS — I10 Essential (primary) hypertension: Secondary | ICD-10-CM

## 2021-12-09 ENCOUNTER — Encounter: Payer: Self-pay | Admitting: Cardiology

## 2021-12-09 ENCOUNTER — Ambulatory Visit: Payer: Medicare Other | Admitting: Cardiology

## 2021-12-09 VITALS — BP 124/75 | HR 85 | Temp 97.3°F | Resp 16 | Ht 64.0 in | Wt 181.8 lb

## 2021-12-09 DIAGNOSIS — Z87891 Personal history of nicotine dependence: Secondary | ICD-10-CM

## 2021-12-09 DIAGNOSIS — I493 Ventricular premature depolarization: Secondary | ICD-10-CM

## 2021-12-09 DIAGNOSIS — E7800 Pure hypercholesterolemia, unspecified: Secondary | ICD-10-CM

## 2021-12-09 DIAGNOSIS — Z79899 Other long term (current) drug therapy: Secondary | ICD-10-CM

## 2021-12-09 DIAGNOSIS — I4510 Unspecified right bundle-branch block: Secondary | ICD-10-CM

## 2021-12-09 DIAGNOSIS — I10 Essential (primary) hypertension: Secondary | ICD-10-CM

## 2021-12-09 DIAGNOSIS — I2510 Atherosclerotic heart disease of native coronary artery without angina pectoris: Secondary | ICD-10-CM

## 2021-12-09 MED ORDER — FLECAINIDE ACETATE 50 MG PO TABS: 100.0000 mg | ORAL_TABLET | Freq: Two times a day (BID) | ORAL | 0 refills | Status: DC

## 2021-12-09 MED ORDER — FLECAINIDE ACETATE 50 MG PO TABS: 50.0000 mg | ORAL_TABLET | Freq: Two times a day (BID) | ORAL | 0 refills | Status: DC

## 2021-12-09 NOTE — Progress Notes (Signed)
? ?[name] ?Date of Birth: [birth year] ?MRN: [id number] ?Primary Care Provider:Tisovec, Fransico Him, MD ?Former Cardiology Providers: Jeri Lager, APRN, FNP-C ?Primary Cardiologist: Rex Kras, DO, [facility] (established care August 30, 2020) ? ?Date: 12/09/21 ?Last Office Visit: 09/01/2021 ? ?Chief Complaint  ?Patient presents with  ? pvc  ? Follow-up  ?  3 month  ? ? ?HPI  ?[name] is a 66 y.o.  female whose past medical history and cardiovascular risk factors include: History of tachycardia, Hx of COVID x2, hypertension, hyperlipidemia, coronary artery atherosclerosis due to calcified plaque, premature ventricular contractions, mitral regurgitation, postmenopausal female, advanced age. ? ?Patient has been following our practice for the management of tachycardia/PVCs and coronary artery calcification. ? ?She presented to the office for 1 year follow-up in January 2023 and during that time her shortness of breath was getting more noticeable.  This was likely secondary to her recent COVID-19 infection in December 2022.  She was overall euvolemic and not in congestive heart failure.  An EKG noted frequent PVCs despite being on verapamil.  The shared decision was to proceed with a 3-day extended Holter monitor to evaluate for PVC burden. ? ?Extended Holter results reviewed with the patient at today's office visit.  She is noted to have a PVC burden of approximately 5% they are multifocal and asymptomatic.  Her shortness of breath since last office visit has slightly improved without any significant changes. ? ?She denies angina pectoris.  Patient plans to move to [place] possibly during the summer months. ? ? ?FUNCTIONAL STATUS: Use to walk 1-2 miles 3-4 times per week; limited now.  ? ?ALLERGIES: ?Allergies  ?Allergen Reactions  ? Dexlansoprazole Diarrhea, Other (See Comments) and Nausea And Vomiting  ?  N/V/D  ? Oxycodone-Acetaminophen Other (See Comments)  ?  extreme headache  ? Breztri Aerosphere  [Budeson-Glycopyrrol-Formoterol]   ?  Severe headache and visual changes  ? Percocet [Oxycodone-Acetaminophen]   ?  Vomit and extreme headache  ? Latex Rash  ? ? ? ?MEDICATION LIST PRIOR TO VISIT: ?Current Outpatient Medications on File Prior to Visit  ?Medication Sig Dispense Refill  ? albuterol (VENTOLIN HFA) 108 (90 Base) MCG/ACT inhaler TAKE 2 PUFFS BY MOUTH EVERY 6 HOURS AS NEEDED FOR WHEEZE OR SHORTNESS OF BREATH 8.5 each 11  ? aspirin EC 81 MG tablet Take 81 mg by mouth daily. Swallow whole.    ? Budeson-Glycopyrrol-Formoterol (BREZTRI AEROSPHERE) 160-9-4.8 MCG/ACT AERO Inhale 2 puffs into the lungs in the morning and at bedtime. 10.7 g 11  ? Cholecalciferol (VITAMIN D3) 1.25 MG (50000 UT) CAPS Take by mouth.    ? cyclobenzaprine (FLEXERIL) 10 MG tablet Take 10 mg by mouth at bedtime.    ? esomeprazole (NEXIUM) 20 MG packet Take 20 mg by mouth in the morning and at bedtime.    ? irbesartan (AVAPRO) 150 MG tablet TAKE 1 TABLET BY MOUTH EVERY DAY 90 tablet 3  ? Melatonin 5 MG CAPS Take 5 mg by mouth at bedtime.    ? rosuvastatin (CRESTOR) 10 MG tablet TAKE 1 TABLET BY MOUTH EVERYDAY AT BEDTIME 90 tablet 3  ? trospium (SANCTURA) 20 MG tablet Take 20 mg by mouth at bedtime.    ? verapamil (VERELAN PM) 240 MG 24 hr capsule TAKE 1 CAPSULE BY MOUTH EVERY DAY 90 capsule 3  ? metroNIDAZOLE (METROCREAM) 0.75 % cream Apply 1 application. topically.    ? ?No current facility-administered medications on file prior to visit.  ? ? ?PAST MEDICAL HISTORY: ?Past  Medical History:  ?Diagnosis Date  ? Allergy   ? Coronary atherosclerosis due to calcified coronary lesion   ? GERD (gastroesophageal reflux disease)   ? Heartburn   ? High blood pressure   ? History of COVID-19   ? Overactive bladder   ? ? ?PAST SURGICAL HISTORY: ?Past Surgical History:  ?Procedure Laterality Date  ? ABDOMINAL HYSTERECTOMY    ? CHOLECYSTECTOMY    ? COLONOSCOPY  2010  ? Dr. Aviva Signs: four polyps in rectum and sigmoid colon (hyperplastic polyps)  ?  COLONOSCOPY N/A 02/07/2020  ? Procedure: COLONOSCOPY;  Surgeon: Daneil Dolin, MD;  [facility];  Service: Endoscopy;  Laterality: N/A;  7:30  ? ESOPHAGOGASTRODUODENOSCOPY N/A 06/08/2014  ? SLF: 1. No obvious source for dyspepsia identified. 2. Medium sized hiatal hernia 3. Mild non-erosive gastritis.  ? ESOPHAGOGASTRODUODENOSCOPY  2010  ? Dr. Aviva Signs: hiatal hernia  ? EUS  05/21/10  ? outlaw:hyperechoic liver mild consistent with steatosis otherwise normal [place]  ? GALLBLADDER SURGERY  2004  ? HEMOSTASIS CLIP PLACEMENT  02/07/2020  ? Procedure: HEMOSTASIS CLIP PLACEMENT;  Surgeon: Daneil Dolin, MD;  [facility];  Service: Endoscopy;;  ? LAPAROSCOPIC NISSEN FUNDOPLICATION N/A 62/10/5595  ? Procedure: LAPAROSCOPIC NISSEN FUNDOPLICATION;  Surgeon: Jamesetta So, MD;  Location: [facility];  Service: General;  Laterality: N/A;  ? POLYPECTOMY  02/07/2020  ? Procedure: POLYPECTOMY;  Surgeon: Daneil Dolin, MD;  [facility];  Service: Endoscopy;;  ? TONSILLECTOMY    ? VIDEO BRONCHOSCOPY Bilateral 01/23/2014  ? Procedure: VIDEO BRONCHOSCOPY WITHOUT FLUORO;  Surgeon: Collene Gobble, MD;  Location: [facility];  Service: Cardiopulmonary;  Laterality: Bilateral;  ? ? ?FAMILY HISTORY: ?The patient's family history includes Allergies in her brother and mother; Cancer in her mother; Heart disease in her father, maternal grandfather, and paternal grandmother. ?  ?SOCIAL HISTORY:  ?The patient  reports that she quit smoking about 13 years ago. Her smoking use included cigarettes. She has a 45.00 pack-year smoking history. She has never used smokeless tobacco. She reports that she does not drink alcohol and does not use drugs. ? ?Review of Systems  ?Cardiovascular:  Negative for chest pain, dyspnea on exertion, leg swelling, near-syncope, orthopnea, palpitations, paroxysmal nocturnal dyspnea and syncope.  ?Respiratory:  Positive for shortness of breath.   ? ?PHYSICAL EXAM: ? ?  12/09/2021  ? 10:13  AM 10/28/2021  ?  9:15 AM 09/01/2021  ?  9:51 AM  ?Vitals with BMI  ?Height '5\' 4"'$  '5\' 4"'$  '5\' 4"'$   ?Weight 181 lbs 13 oz 182 lbs 10 oz 186 lbs  ?BMI 31.19 31.33 31.91  ?Systolic 416 384 536  ?Diastolic 75 76 71  ?Pulse 85 89 83  ? ?CONSTITUTIONAL: Well-developed and well-nourished. No acute distress.  ?SKIN: Skin is warm and dry. No rash noted. No cyanosis. No pallor. No jaundice ?HEAD: Normocephalic and atraumatic.  ?EYES: No scleral icterus ?MOUTH/THROAT: Moist oral membranes.  ?NECK: No JVD present. No thyromegaly noted. No carotid bruits  ?LYMPHATIC: No visible cervical adenopathy.  ?CHEST Normal respiratory effort. No intercostal retractions  ?LUNGS: Clear to auscultation bilaterally. No stridor. No wheezes. No rales.  ?CARDIOVASCULAR: Regular rate and rhythm, positive I6-O0, soft holosystolic murmur heard at the apex  ?ABDOMINAL: Obese, soft, nontender, nondistended, positive bowel sounds all 4 quadrants. No apparent ascites.  ?EXTREMITIES: No peripheral edema, warm to touch. ?HEMATOLOGIC: No significant bruising ?NEUROLOGIC: Oriented to person, place, and time. Nonfocal. Normal muscle tone.  ?PSYCHIATRIC:  Normal mood and affect. Normal behavior. Cooperative ? ?RADIOLOGY: ?CT chest high resolution 06/24/2018: ?1. New clustered nodularity/tree-in-bud opacities in the medial basilar right upper lobe and peripheral left upper lobe with dominant new 6 mm peripheral left upper lobe nodule. While probably inflammatory related to progression of MAI given the below findings, non-contrast chest CT at 3-6 months is recommended in this high-risk patient with smoking related changes in the lungs. This recommendation follows the consensus statement: Guidelines for Management of Incidental Pulmonary Nodules Detected on CT Images: From the Fleischner Society 2017; Radiology 2017; 284:228-243. ?2. 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 ?3. Moderate emphysema with mild diffuse bronchial wall thickening, suggesting COPD. ?4. Small hiatal hernia. Fluid in the thoracic esophagus, suggesting esopha

## 2021-12-15 ENCOUNTER — Other Ambulatory Visit: Payer: Self-pay

## 2021-12-15 DIAGNOSIS — Z79899 Other long term (current) drug therapy: Secondary | ICD-10-CM

## 2021-12-15 DIAGNOSIS — I493 Ventricular premature depolarization: Secondary | ICD-10-CM

## 2021-12-17 ENCOUNTER — Ambulatory Visit: Payer: Medicare Other | Admitting: Cardiology

## 2022-01-01 ENCOUNTER — Other Ambulatory Visit: Payer: Self-pay | Admitting: Cardiology

## 2022-01-01 DIAGNOSIS — Z79899 Other long term (current) drug therapy: Secondary | ICD-10-CM

## 2022-01-01 DIAGNOSIS — I493 Ventricular premature depolarization: Secondary | ICD-10-CM

## 2022-01-01 NOTE — Telephone Encounter (Signed)
Okay to refill? 

## 2022-01-14 ENCOUNTER — Ambulatory Visit: Payer: Medicare Other | Admitting: Cardiology

## 2022-01-15 ENCOUNTER — Other Ambulatory Visit: Payer: Self-pay | Admitting: Cardiology

## 2022-01-15 DIAGNOSIS — I493 Ventricular premature depolarization: Secondary | ICD-10-CM

## 2022-01-15 DIAGNOSIS — Z79899 Other long term (current) drug therapy: Secondary | ICD-10-CM

## 2022-01-15 NOTE — Telephone Encounter (Signed)
Refill request

## 2022-03-10 ENCOUNTER — Encounter: Payer: Self-pay | Admitting: Cardiology

## 2022-03-10 ENCOUNTER — Ambulatory Visit: Payer: Medicare Other | Admitting: Podiatry

## 2022-03-10 ENCOUNTER — Ambulatory Visit (INDEPENDENT_AMBULATORY_CARE_PROVIDER_SITE_OTHER): Payer: Medicare Other

## 2022-03-10 ENCOUNTER — Ambulatory Visit: Payer: Medicare Other | Admitting: Cardiology

## 2022-03-10 ENCOUNTER — Encounter: Payer: Self-pay | Admitting: Podiatry

## 2022-03-10 VITALS — BP 128/68 | HR 80 | Temp 98.4°F | Resp 16 | Ht 64.0 in | Wt 181.2 lb

## 2022-03-10 DIAGNOSIS — M778 Other enthesopathies, not elsewhere classified: Secondary | ICD-10-CM

## 2022-03-10 DIAGNOSIS — M722 Plantar fascial fibromatosis: Secondary | ICD-10-CM

## 2022-03-10 DIAGNOSIS — I493 Ventricular premature depolarization: Secondary | ICD-10-CM

## 2022-03-10 DIAGNOSIS — I2510 Atherosclerotic heart disease of native coronary artery without angina pectoris: Secondary | ICD-10-CM

## 2022-03-10 DIAGNOSIS — I10 Essential (primary) hypertension: Secondary | ICD-10-CM

## 2022-03-10 DIAGNOSIS — M25871 Other specified joint disorders, right ankle and foot: Secondary | ICD-10-CM

## 2022-03-10 DIAGNOSIS — R0609 Other forms of dyspnea: Secondary | ICD-10-CM

## 2022-03-10 DIAGNOSIS — I4510 Unspecified right bundle-branch block: Secondary | ICD-10-CM

## 2022-03-10 DIAGNOSIS — E7800 Pure hypercholesterolemia, unspecified: Secondary | ICD-10-CM

## 2022-03-10 DIAGNOSIS — Z87891 Personal history of nicotine dependence: Secondary | ICD-10-CM

## 2022-03-10 MED ORDER — TRIAMCINOLONE ACETONIDE 40 MG/ML IJ SUSP
20.0000 mg | Freq: Once | INTRAMUSCULAR | Status: AC
  Administered 2022-03-10: 20 mg

## 2022-03-10 MED ORDER — METHYLPREDNISOLONE 4 MG PO TBPK: ORAL_TABLET | ORAL | 0 refills | Status: DC

## 2022-03-10 MED ORDER — MELOXICAM 15 MG PO TABS: 15.0000 mg | ORAL_TABLET | Freq: Every day | ORAL | 3 refills | Status: DC

## 2022-03-10 NOTE — Patient Instructions (Signed)

## 2022-03-10 NOTE — Progress Notes (Signed)
[name] Date of Birth: 12-01-1955 MRN: [id number] Primary Care Provider:Tisovec, Fransico Him, MD Former Cardiology Providers: Jeri Lager, APRN, FNP-C Primary Cardiologist: Rex Kras, DO, [facility] (established care August 30, 2020)  Date: 03/10/22 Last Office Visit: 12/09/2021  Chief Complaint  Patient presents with   PVC   Follow-up    3 months    HPI  [name] is a 66 y.o.  female whose past medical history and cardiovascular risk factors include: History of tachycardia, Hx of COVID x2, hypertension, hyperlipidemia, coronary artery atherosclerosis due to calcified plaque, premature ventricular contractions, mitral regurgitation, postmenopausal female, advanced age.  Referred to practice for management of tachycardia/PVCs and coronary artery calcification.  Her shortness of breath is initially attributed to COVID-19 infection back in December 2022.  She is been in sinus rhythm and overall euvolemic on physical examination.  However on surface ECG she is noted to have PVCs in the past and was placed on a 3-day extended Holter monitor which noted a PVC burden of approximately 5%.  The PVCs are multifocal in the shared decision at last office visit was to initiate flecainide as if she was already on diltiazem.  However, it appears that flecainide was never initiated if she continues to have shortness of breath with effort related activities.  Intensity, frequency, and or duration has not changed significantly.  She denies anginal discomfort.  FUNCTIONAL STATUS: Use to walk 1-2 miles 3-4 times per week; limited now.   ALLERGIES: Allergies  Allergen Reactions   Dexlansoprazole Diarrhea, Other (See Comments) and Nausea And Vomiting    N/V/D   Oxycodone-Acetaminophen Other (See Comments)    extreme headache   Breztri Aerosphere [Budeson-Glycopyrrol-Formoterol]     Severe headache and visual changes   Percocet [Oxycodone-Acetaminophen]     Vomit and extreme headache    Latex Rash     MEDICATION LIST PRIOR TO VISIT: Current Outpatient Medications on File Prior to Visit  Medication Sig Dispense Refill   albuterol (VENTOLIN HFA) 108 (90 Base) MCG/ACT inhaler TAKE 2 PUFFS BY MOUTH EVERY 6 HOURS AS NEEDED FOR WHEEZE OR SHORTNESS OF BREATH 8.5 each 11   aspirin EC 81 MG tablet Take 81 mg by mouth daily. Swallow whole.     Budeson-Glycopyrrol-Formoterol (BREZTRI AEROSPHERE) 160-9-4.8 MCG/ACT AERO Inhale 2 puffs into the lungs in the morning and at bedtime. 10.7 g 11   Cholecalciferol (VITAMIN D3) 1.25 MG (50000 UT) CAPS Take by mouth.     cyclobenzaprine (FLEXERIL) 10 MG tablet Take 10 mg by mouth at bedtime.     esomeprazole (NEXIUM) 20 MG packet Take 20 mg by mouth in the morning and at bedtime.     irbesartan (AVAPRO) 150 MG tablet TAKE 1 TABLET BY MOUTH EVERY DAY 90 tablet 3   Melatonin 5 MG CAPS Take 5 mg by mouth at bedtime.     metroNIDAZOLE (METROCREAM) 0.75 % cream Apply 1 application. topically.     rosuvastatin (CRESTOR) 10 MG tablet TAKE 1 TABLET BY MOUTH EVERYDAY AT BEDTIME 90 tablet 3   trospium (SANCTURA) 20 MG tablet Take 20 mg by mouth at bedtime.     No current facility-administered medications on file prior to visit.    PAST MEDICAL HISTORY: Past Medical History:  Diagnosis Date   Allergy    Coronary atherosclerosis due to calcified coronary lesion    GERD (gastroesophageal reflux disease)    Heartburn    High blood pressure    History of COVID-19    Overactive  bladder     PAST SURGICAL HISTORY: Past Surgical History:  Procedure Laterality Date   ABDOMINAL HYSTERECTOMY     CHOLECYSTECTOMY     COLONOSCOPY  2010   Dr. Aviva Signs: four polyps in rectum and sigmoid colon (hyperplastic polyps)   COLONOSCOPY N/A 02/07/2020   Procedure: COLONOSCOPY;  Surgeon: Daneil Dolin, MD;  [facility];  Service: Endoscopy;  Laterality: N/A;  7:30   ESOPHAGOGASTRODUODENOSCOPY N/A 06/08/2014   SLF: 1. No obvious source for  dyspepsia identified. 2. Medium sized hiatal hernia 3. Mild non-erosive gastritis.   ESOPHAGOGASTRODUODENOSCOPY  2010   Dr. Aviva Signs: hiatal hernia   EUS  05/21/10   outlaw:hyperechoic liver mild consistent with steatosis otherwise normal [place]   GALLBLADDER SURGERY  2004   HEMOSTASIS CLIP PLACEMENT  02/07/2020   Procedure: HEMOSTASIS CLIP PLACEMENT;  Surgeon: Daneil Dolin, MD;  [facility];  Service: Endoscopy;;   LAPAROSCOPIC NISSEN FUNDOPLICATION N/A 50/04/3817   Procedure: LAPAROSCOPIC NISSEN FUNDOPLICATION;  Surgeon: Jamesetta So, MD;  Location: [facility];  Service: General;  Laterality: N/A;   POLYPECTOMY  02/07/2020   Procedure: POLYPECTOMY;  Surgeon: Daneil Dolin, MD;  [facility];  Service: Endoscopy;;   TONSILLECTOMY     VIDEO BRONCHOSCOPY Bilateral 01/23/2014   Procedure: VIDEO BRONCHOSCOPY WITHOUT FLUORO;  Surgeon: Collene Gobble, MD;  Location: [facility];  Service: Cardiopulmonary;  Laterality: Bilateral;    FAMILY HISTORY: The patient's family history includes Allergies in her brother and mother; Cancer in her mother; Heart disease in her father, maternal grandfather, and paternal grandmother.   SOCIAL HISTORY:  The patient  reports that she quit smoking about 13 years ago. Her smoking use included cigarettes. She has a 45.00 pack-year smoking history. She has never used smokeless tobacco. She reports that she does not drink alcohol and does not use drugs.  Review of Systems  Cardiovascular:  Positive for dyspnea on exertion and palpitations (w/ exertion- chronic and stable). Negative for chest pain, leg swelling, near-syncope, orthopnea, paroxysmal nocturnal dyspnea and syncope.    PHYSICAL EXAM:    03/10/2022   10:19 AM 12/09/2021   10:13 AM 10/28/2021    9:15 AM  Vitals with BMI  Height 5' 4"  5' 4"  5' 4"   Weight 181 lbs 3 oz 181 lbs 13 oz 182 lbs 10 oz  BMI 31.09 29.93 71.69  Systolic 678 938 101  Diastolic 68 75 76  Pulse 80 85 89    Physical Exam  Neck: No JVD present.  Cardiovascular: Normal rate, regular rhythm, S1 normal, S2 normal, intact distal pulses and normal pulses. Exam reveals no gallop, no S3 and no S4.  No murmur heard. Pulmonary/Chest: Effort normal and breath sounds normal. No stridor.  Abdominal: Soft. Bowel sounds are normal. She exhibits no distension. There is no abdominal tenderness.  Musculoskeletal:        General: No edema.   RADIOLOGY: CT chest high resolution 06/24/2018: 1. New clustered nodularity/tree-in-bud opacities in the medial basilar right upper lobe and peripheral left upper lobe with dominant new 6 mm peripheral left upper lobe nodule. While probably inflammatory related to progression of MAI given the below findings, non-contrast chest CT at 3-6 months is recommended in this high-risk patient with smoking related changes in the lungs. This recommendation follows the consensus statement: Guidelines for Management of Incidental Pulmonary Nodules Detected on CT Images: From the Fleischner Society 2017; Radiology 2017; 284:228-243. 2. 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 3. Moderate emphysema with mild diffuse bronchial wall thickening, suggesting COPD. 4. Small hiatal hernia. Fluid in the thoracic esophagus, suggesting esophageal dysmotility and/or gastroesophageal reflux. Nonspecific mild circumferential wall thickening in the lower thoracic esophagus, stable, most commonly due to reflux esophagitis, with Barrett's esophagus or neoplasm not excluded. 5. One vessel coronary atherosclerosis. Aortic Atherosclerosis (ICD10-I70.0) and Emphysema (ICD10-J43.9  CARDIAC DATABASE: EKG: 08/30/2020: Sinus  Rhythm, 71bpm, RBBB, without underlying injury pattern.  09/01/2021: NSR, 89 bpm, right bundle branch block, PVCs.   03/10/2022: Normal sinus rhythm,  86 bpm, occasional PVCs, right bundle branch block.   Echocardiogram: 08/06/2021: Normal LV systolic function with visual EF 60-65%. Left ventricle cavity is normal in size. Normal left ventricular wall thickness. Normal global wall motion. Normal diastolic filling pattern, normal LAP.  Trace tricuspid regurgitation. No evidence of pulmonary hypertension. Insignificant pericardial effusion. There is no hemodynamic significance. Compared to study 07/22/2016 LVEF remains stable, MR and TR have improved, otherwise no significant change.   Stress Testing:  Nuclear stress test  [08/06/2016]: 1. The resting electrocardiogram demonstrated normal sinus rhythm, RBBB, no resting arrhythmias and normal rest repolarization. The stress electrocardiogram was normal. Patient exercised on Bruce protocol for 7:00 minutes and achieved 7.05 METS. Stress test terminated due to dyspnea and 91% MPHR achieved (Target HR >85%). Hypertensive Bp response (200/98 mm Hg peak). 2. Myocardial perfusion imaging is normal. Overall left ventricular systolic function was normal without regional wall motion abnormalities. The left ventricular ejection fraction was 71%.  Heart Catheterization: None  3 day extended Holter monitor: Dominant rhythm normal sinus., followed by sinus tachycardia (10%). Heart rate 55-133 bpm.  Avg HR 83 bpm. No atrial fibrillation, supraventricular tachycardia, ventricular tachycardia, high grade AV block, pauses (3 seconds or longer). Total ventricular ectopic burden 5% (predominantly isolated PVCs but also present in ventricular bigeminy/trigeminal pattern). Total supraventricular ectopic burden <1%. Patient triggered events: 0.    LABORATORY DATA: External Labs: Collected: February 13, 2020 Creatinine 0.8 mg/dL. eGFR: 72 mL/min per 1.73 m Potassium 4.7  Hemoglobin 16 g/dL, hematocrit 49.9% Lipid profile: Total cholesterol 153, triglycerides 93, HDL 55, LDL 79, non HDL 98 Hemoglobin A1c:  5.8%  External labs: Collected 04/01/2021. Available in Care Everywhere. COMPLETE METABOLIC   [id number]    GLUCOSE 107   60 - 110  BUN 26   5 - 23  CREATININE 1.0   0.3 - 1.5  eGFR Non-African American 55.5   <  eGFR African American 67.1   <  SODIUM 142   135 - 148  POTASSIUM 4.8   3.5 - 5.3  CHLORIDE 106   80 - 111  CO2 24   15 - 35  CALCIUM 9.0   7.0 - 10.5  TOTAL PROTEIN 6.4   6.0 - 8.5  ALBUMIN 3.7   2.0 - 5.5  AST 16   7 - 45  ALT 25   5 - 40  ALK PHOS 97   37 - 137  TOTAL BILIRUBIN 0.5   0.0 - 1.5  CBC   2021-04-01    WBC 9.95   4.10 - 10.90  LYM 3.0   0.6 - 4.1  BASO% 0.8   0.0 - 2.0  EOS 0.4   0.0 - 0.4  BASO 0.1   0.0 - 0.2  EOS% 4.5   0.0 - 7.8  LYM% 29.7   10.0 - 58.5  RBC 5.2   4.2 -  6.3  HGB 15.1   12.0 - 18.0  HCT 46.3   37.0 - 51.0  MCV 89.5   80.0 - 97.0  MCH 29.1   26.0 - 32.0  MCHC 32.5   31.0 - 36.0  RDW 12.7   12.3 - 15.4  PLT 241   140 - 440  MPV 7.3   6.9 - 10.6  NEU 5.6   1.4 - 7.0  MONO% 8.5   4.6 - 12.4  MONO 0.8   0.1 - 0.9  NEU% 56.5   43.3 - 71.9  LIPID   2021-04-01    CHOLESTEROL 152   100 - 200  TRIGLYCERIDES 109   30 - 149  HDL 58   40 - 60  LDL 72   <  CHOL/HDL RATIO 2.6   <  LDL/HDL RATIO 1.2   1.7 - 2.5  NON-HDL 94.0   <  A1C   2021-04-01    A1C 6.2      IMPRESSION:    ICD-10-CM   1. PVC (premature ventricular contraction)  I49.3 EKG 12-Lead    verapamil 300 MG CP24    2. Dyspnea on exertion  R06.09     3. Coronary atherosclerosis due to calcified coronary lesion of native artery  I25.10    I25.84     4. Pure hypercholesterolemia  E78.00     5. Essential hypertension  I10     6. Right bundle branch block  I45.10     7. Former smoker  Z87.891        RECOMMENDATIONS: [name] is a 66 y.o. female whose past medical history and cardiovascular risk factors include: tachycardia, hypertension, hyperlipidemia, coronary artery atherosclerosis due to calcified plaque, mitral regurgitation, postmenopausal  female, advanced age.  PVC (premature ventricular contraction) Noted to have a PVC burden of approximately 5%. PVCs multifocal. EKG shows sinus rhythm with occasional PVCs. Due to her shortness of breath with effort related activities discussed initiating flecainide or uptitration of verapamil. Patient called the office after her visit and requested that the dose of verapamil to be uptitrated. Verapamil 300 mg p.o. daily.  30-day supply provided. If you continue to have dyspnea despite up titration of AV nodal blocking agents that shared decision was to proceed with ischemic work-up at the next office visit.  Coronary atherosclerosis due to calcified coronary lesion of native artery Continue aspirin and statin therapy. Prior ischemic work-up reviewed. If her dyspnea on exertion continues we will discuss undergoing a coronary CT scan versus MPI.  Pure hypercholesterolemia Currently on rosuvastatin. Does not endorse myalgias. Has an upcoming appointment with PCP in August 2023 and will have repeat lipids.  Essential hypertension Office blood pressures are well controlled. Continue current medical therapy. I have asked her to keep a log of her blood pressures to see if they are elevated at home and if so this could also contribute to her dyspnea on exertion.  We will await her feedback.    FINAL MEDICATION LIST END OF ENCOUNTER: Meds ordered this encounter  Medications   verapamil 300 MG CP24    Sig: Take 1 capsule (300 mg total) by mouth at bedtime.    Dispense:  30 capsule    Refill:  0    Medications Discontinued During This Encounter  Medication Reason   flecainide (TAMBOCOR) 50 MG tablet    verapamil (VERELAN PM) 240 MG 24 hr capsule      Current Outpatient Medications:    albuterol (VENTOLIN HFA)  108 (90 Base) MCG/ACT inhaler, TAKE 2 PUFFS BY MOUTH EVERY 6 HOURS AS NEEDED FOR WHEEZE OR SHORTNESS OF BREATH, Disp: 8.5 each, Rfl: 11   aspirin EC 81 MG tablet, Take 81 mg by  mouth daily. Swallow whole., Disp: , Rfl:    Budeson-Glycopyrrol-Formoterol (BREZTRI AEROSPHERE) 160-9-4.8 MCG/ACT AERO, Inhale 2 puffs into the lungs in the morning and at bedtime., Disp: 10.7 g, Rfl: 11   Cholecalciferol (VITAMIN D3) 1.25 MG (50000 UT) CAPS, Take by mouth., Disp: , Rfl:    cyclobenzaprine (FLEXERIL) 10 MG tablet, Take 10 mg by mouth at bedtime., Disp: , Rfl:    esomeprazole (NEXIUM) 20 MG packet, Take 20 mg by mouth in the morning and at bedtime., Disp: , Rfl:    irbesartan (AVAPRO) 150 MG tablet, TAKE 1 TABLET BY MOUTH EVERY DAY, Disp: 90 tablet, Rfl: 3   Melatonin 5 MG CAPS, Take 5 mg by mouth at bedtime., Disp: , Rfl:    metroNIDAZOLE (METROCREAM) 0.75 % cream, Apply 1 application. topically., Disp: , Rfl:    rosuvastatin (CRESTOR) 10 MG tablet, TAKE 1 TABLET BY MOUTH EVERYDAY AT BEDTIME, Disp: 90 tablet, Rfl: 3   trospium (SANCTURA) 20 MG tablet, Take 20 mg by mouth at bedtime., Disp: , Rfl:    verapamil 300 MG CP24, Take 1 capsule (300 mg total) by mouth at bedtime., Disp: 30 capsule, Rfl: 0   doxycycline (VIBRAMYCIN) 100 MG capsule, Take 100 mg by mouth 2 (two) times daily., Disp: , Rfl:    meloxicam (MOBIC) 15 MG tablet, Take 1 tablet (15 mg total) by mouth daily., Disp: 30 tablet, Rfl: 3   methylPREDNISolone (MEDROL DOSEPAK) 4 MG TBPK tablet, 6 day dose pack - take as directed, Disp: 21 tablet, Rfl: 0   mupirocin ointment (BACTROBAN) 2 %, SMARTSIG:1 sparingly Topical Twice Daily, Disp: , Rfl:   Orders Placed This Encounter  Procedures   EKG 12-Lead   --Continue cardiac medications as reconciled in final medication list. --Return in about 6 months (around 09/10/2022) for Follow up dyspnea, PVCs . Or sooner if needed. --Continue follow-up with your primary care physician regarding the management of your other chronic comorbid conditions.  Patient's questions and concerns were addressed to her satisfaction. She voices understanding of the instructions provided during  this encounter.   This note was created using a voice recognition software as a result there may be grammatical errors inadvertently enclosed that do not reflect the nature of this encounter. Every attempt is made to correct such errors.  Rex Kras, [place], [facility]  Pager: [phone] Office: [phone]

## 2022-03-10 NOTE — Progress Notes (Signed)
Subjective:  Patient ID: [name], female    DOB: 06-10-1956,  MRN: [id number] HPI Chief Complaint  Patient presents with   Foot Pain    Plantar heel left - aching, sharp, stabbing sensations x 3 weeks, AM pain, tried Advil and Tylenol, icy hot-helped some  1st MPJ right - bunion surgery 04/29/2018, started to swell and limited ROM   New Patient (Initial Visit)    Est pt 10/2018    66 y.o. female presents with the above complaint.   ROS: Denies fever chills nausea vomiting muscle aches pains calf pain back pain chest pain shortness of breath.  Past Medical History:  Diagnosis Date   Allergy    Coronary atherosclerosis due to calcified coronary lesion    GERD (gastroesophageal reflux disease)    Heartburn    High blood pressure    History of COVID-19    Overactive bladder    Past Surgical History:  Procedure Laterality Date   ABDOMINAL HYSTERECTOMY     CHOLECYSTECTOMY     COLONOSCOPY  2010   Dr. Aviva Signs: four polyps in rectum and sigmoid colon (hyperplastic polyps)   COLONOSCOPY N/A 02/07/2020   Procedure: COLONOSCOPY;  Surgeon: Daneil Dolin, MD;  Location: [facility];  Service: Endoscopy;  Laterality: N/A;  7:30   ESOPHAGOGASTRODUODENOSCOPY N/A 06/08/2014   SLF: 1. No obvious source for dyspepsia identified. 2. Medium sized hiatal hernia 3. Mild non-erosive gastritis.   ESOPHAGOGASTRODUODENOSCOPY  2010   Dr. Aviva Signs: hiatal hernia   EUS  05/21/10   outlaw:hyperechoic liver mild consistent with steatosis otherwise normal US   GALLBLADDER SURGERY  2004   HEMOSTASIS CLIP PLACEMENT  02/07/2020   Procedure: HEMOSTASIS CLIP PLACEMENT;  Surgeon: Daneil Dolin, MD;  [facility];  Service: Endoscopy;;   LAPAROSCOPIC NISSEN FUNDOPLICATION N/A 34/91/7915   Procedure: LAPAROSCOPIC NISSEN FUNDOPLICATION;  Surgeon: Jamesetta So, MD;  Location: [facility];  Service: General;  Laterality: N/A;   POLYPECTOMY  02/07/2020   Procedure: POLYPECTOMY;  Surgeon:  Daneil Dolin, MD;  [facility];  Service: Endoscopy;;   TONSILLECTOMY     VIDEO BRONCHOSCOPY Bilateral 01/23/2014   Procedure: VIDEO BRONCHOSCOPY WITHOUT FLUORO;  Surgeon: Collene Gobble, MD;  Location: [facility];  Service: Cardiopulmonary;  Laterality: Bilateral;    Current Outpatient Medications:    meloxicam (MOBIC) 15 MG tablet, Take 1 tablet (15 mg total) by mouth daily., Disp: 30 tablet, Rfl: 3   methylPREDNISolone (MEDROL DOSEPAK) 4 MG TBPK tablet, 6 day dose pack - take as directed, Disp: 21 tablet, Rfl: 0   albuterol (VENTOLIN HFA) 108 (90 Base) MCG/ACT inhaler, TAKE 2 PUFFS BY MOUTH EVERY 6 HOURS AS NEEDED FOR WHEEZE OR SHORTNESS OF BREATH, Disp: 8.5 each, Rfl: 11   aspirin EC 81 MG tablet, Take 81 mg by mouth daily. Swallow whole., Disp: , Rfl:    Budeson-Glycopyrrol-Formoterol (BREZTRI AEROSPHERE) 160-9-4.8 MCG/ACT AERO, Inhale 2 puffs into the lungs in the morning and at bedtime., Disp: 10.7 g, Rfl: 11   Cholecalciferol (VITAMIN D3) 1.25 MG (50000 UT) CAPS, Take by mouth., Disp: , Rfl:    cyclobenzaprine (FLEXERIL) 10 MG tablet, Take 10 mg by mouth at bedtime., Disp: , Rfl:    doxycycline (VIBRAMYCIN) 100 MG capsule, Take 100 mg by mouth 2 (two) times daily., Disp: , Rfl:    esomeprazole (NEXIUM) 20 MG packet, Take 20 mg by mouth in the morning and at bedtime., Disp: , Rfl:    irbesartan (  AVAPRO) 150 MG tablet, TAKE 1 TABLET BY MOUTH EVERY DAY, Disp: 90 tablet, Rfl: 3   Melatonin 5 MG CAPS, Take 5 mg by mouth at bedtime., Disp: , Rfl:    metroNIDAZOLE (METROCREAM) 0.75 % cream, Apply 1 application. topically., Disp: , Rfl:    mupirocin ointment (BACTROBAN) 2 %, SMARTSIG:1 sparingly Topical Twice Daily, Disp: , Rfl:    rosuvastatin (CRESTOR) 10 MG tablet, TAKE 1 TABLET BY MOUTH EVERYDAY AT BEDTIME, Disp: 90 tablet, Rfl: 3   trospium (SANCTURA) 20 MG tablet, Take 20 mg by mouth at bedtime., Disp: , Rfl:    verapamil (VERELAN PM) 240 MG 24 hr capsule, TAKE 1 CAPSULE BY  MOUTH EVERY DAY, Disp: 90 capsule, Rfl: 3  Allergies  Allergen Reactions   Dexlansoprazole Diarrhea, Other (See Comments) and Nausea And Vomiting    N/V/D   Oxycodone-Acetaminophen Other (See Comments)    extreme headache   Breztri Aerosphere [Budeson-Glycopyrrol-Formoterol]     Severe headache and visual changes   Percocet [Oxycodone-Acetaminophen]     Vomit and extreme headache   Latex Rash   Review of Systems Objective:  There were no vitals filed for this visit.  General: Well developed, nourished, in no acute distress, alert and oriented x3   Dermatological: Skin is warm, dry and supple bilateral. Nails x 10 are well maintained; remaining integument appears unremarkable at this time. There are no open sores, no preulcerative lesions, no rash or signs of infection present.  Vascular: Dorsalis Pedis artery and Posterior Tibial artery pedal pulses are 2/4 bilateral with immedate capillary fill time. Pedal hair growth present. No varicosities and no lower extremity edema present bilateral.   Neruologic: Grossly intact via light touch bilateral. Vibratory intact via tuning fork bilateral. Protective threshold with Semmes Wienstein monofilament intact to all pedal sites bilateral. Patellar and Achilles deep tendon reflexes 2+ bilateral. No Babinski or clonus noted bilateral.   Musculoskeletal: No gross boney pedal deformities bilateral. No pain, crepitus, or limitation noted with foot and ankle range of motion bilateral. Muscular strength 5/5 in all groups tested bilateral.  Pain on palpation medial calcaneal tubercle of the left heel.  She has no pain on medial-lateral compression of the calcaneus.  She also has some mild tenderness on end range of motion of the first metatarsophalangeal joint with some palpable spurring dorsally.  Gait: Unassisted, Nonantalgic.    Radiographs:  Radiographs taken today demonstrate soft tissue increase in density plantar fascial Caney insertion site  with plantar distally oriented calcaneal heel spur.  This is indicative of Planter fasciitis.  Assessment & Plan:   Assessment: Plan fasciitis.  Hallux limitus with sesamoiditis  Plan: Injected her left heel today 20 mg Kenalog 5 mg Marcaine start her Medrol Dosepak to be followed by meloxicam.  Placed on plantar fascia brace.  Discussed appropriate shoe gear stretching exercise and ice therapy.     Blimie Vaness T. [place], [place]

## 2022-03-13 ENCOUNTER — Other Ambulatory Visit: Payer: Self-pay

## 2022-03-13 ENCOUNTER — Telehealth: Payer: Self-pay

## 2022-03-13 NOTE — Telephone Encounter (Signed)
Pt called to inform us that she spoke to you on 07/18 and was told that you would like to increase her Verapamil. On your note it mention that pt had just refilled her old Prescription. Pt is now saying she would like to refill her new Prescription instead. I did not see the increase dose for the medication. Would you be able to send in the new prescription please.

## 2022-03-15 ENCOUNTER — Encounter: Payer: Self-pay | Admitting: Cardiology

## 2022-03-15 MED ORDER — VERAPAMIL HCL ER 300 MG PO CP24: 300.0000 mg | ORAL_CAPSULE | Freq: Every day | ORAL | 0 refills | Status: DC

## 2022-03-15 NOTE — Telephone Encounter (Signed)
Script sent to her for 30days.  If the dose works for her great will send in 90days at time for refill.  If she has shortness of breath and no improvement please have her come in sooner for re-check.   Dr.Glenisha Gundry

## 2022-03-16 NOTE — Telephone Encounter (Signed)
Called pt no answer, left a vm

## 2022-03-20 ENCOUNTER — Other Ambulatory Visit: Payer: Self-pay

## 2022-03-20 DIAGNOSIS — I493 Ventricular premature depolarization: Secondary | ICD-10-CM

## 2022-03-20 MED ORDER — VERAPAMIL HCL ER 300 MG PO CP24: 300.0000 mg | ORAL_CAPSULE | Freq: Every day | ORAL | 0 refills | Status: DC

## 2022-03-20 NOTE — Telephone Encounter (Signed)
Called and spoke to patient she said medication is working fine

## 2022-04-07 ENCOUNTER — Encounter: Payer: Self-pay | Admitting: Podiatry

## 2022-04-07 ENCOUNTER — Ambulatory Visit: Payer: Medicare Other | Admitting: Podiatry

## 2022-04-07 DIAGNOSIS — M722 Plantar fascial fibromatosis: Secondary | ICD-10-CM | POA: Diagnosis not present

## 2022-04-07 NOTE — Progress Notes (Signed)
She presents today for follow-up of her Planter fasciitis and capsulitis of her right foot.  She denies fever chills nausea vomiting muscle aches pains calf pain back chest pain shortness of breath states that she is doing 100% better.  States that is considerably improved and she is very happy with the fasciitis.  Objective: Vital signs stable alert oriented x3 she has no pain on palpation medial calcaneal tubercle of the left heel.  She does have still some tenderness on range of motion of the first metatarsophalangeal joint.  Assessment: Capsulitis hallux limitus first metatarsophalangeal joint right foot resolving tendinitis Planter fasciitis left foot.  Plan: I encouraged her to continue her plantar fascia brace her meloxicam and her good shoes and to allow her to start back to her regular walking routine.  I will follow-up with her in a few weeks or on an as-needed basis.

## 2022-06-29 ENCOUNTER — Ambulatory Visit: Payer: Medicare Other | Admitting: Podiatry

## 2022-06-29 ENCOUNTER — Other Ambulatory Visit: Payer: Self-pay | Admitting: Podiatry

## 2022-07-02 DIAGNOSIS — J189 Pneumonia, unspecified organism: Secondary | ICD-10-CM | POA: Insufficient documentation

## 2022-07-05 ENCOUNTER — Other Ambulatory Visit: Payer: Self-pay | Admitting: Cardiology

## 2022-07-05 DIAGNOSIS — I493 Ventricular premature depolarization: Secondary | ICD-10-CM

## 2022-08-28 ENCOUNTER — Other Ambulatory Visit: Payer: Self-pay | Admitting: Cardiology

## 2022-08-28 DIAGNOSIS — I2510 Atherosclerotic heart disease of native coronary artery without angina pectoris: Secondary | ICD-10-CM

## 2022-09-10 ENCOUNTER — Ambulatory Visit: Payer: Medicare Other | Admitting: Cardiology

## 2022-09-10 ENCOUNTER — Encounter: Payer: Self-pay | Admitting: Cardiology

## 2022-09-10 VITALS — BP 150/81 | HR 80 | Resp 16 | Ht 64.0 in

## 2022-09-10 DIAGNOSIS — I493 Ventricular premature depolarization: Secondary | ICD-10-CM

## 2022-09-10 DIAGNOSIS — I4510 Unspecified right bundle-branch block: Secondary | ICD-10-CM

## 2022-09-10 DIAGNOSIS — E7800 Pure hypercholesterolemia, unspecified: Secondary | ICD-10-CM

## 2022-09-10 DIAGNOSIS — Z87891 Personal history of nicotine dependence: Secondary | ICD-10-CM

## 2022-09-10 DIAGNOSIS — R0609 Other forms of dyspnea: Secondary | ICD-10-CM

## 2022-09-10 DIAGNOSIS — I2510 Atherosclerotic heart disease of native coronary artery without angina pectoris: Secondary | ICD-10-CM

## 2022-09-10 DIAGNOSIS — I10 Essential (primary) hypertension: Secondary | ICD-10-CM

## 2022-09-10 MED ORDER — VERAPAMIL HCL ER 300 MG PO CP24: ORAL_CAPSULE | ORAL | 0 refills | Status: DC

## 2022-09-10 NOTE — Progress Notes (Signed)
[name] Date of Birth: 1956-02-20 MRN: [id number] Primary Care Provider:Tisovec, Cathy Him, MD Former Cardiology Providers: Jeri Lager, APRN, FNP-C Primary Cardiologist: Rex Kras, DO, [facility] (established care August 30, 2020)  Date: 09/10/22 Last Office Visit: 03/10/2022  Chief Complaint  Patient presents with   PVC   Follow-up    6 month    HPI  [name] is a 67 y.o.  female whose past medical history and cardiovascular risk factors include: History of tachycardia, Hx of COVID x2, hypertension, hyperlipidemia, coronary artery atherosclerosis due to calcified plaque, premature ventricular contractions, mitral regurgitation, postmenopausal female, advanced age.  Patient is being followed by the practice for management of tachycardia/PVC as well as CAC.  In the past her PVC burden was approximately 5% and noted to have multifocal PVCs.  Shared decision was to uptitrate her dose of diltiazem.  She now presents for 52-monthfollow-up visit.  Over the last 6 months she is doing well from a cardiovascular standpoint.  However, from a general medicine standpoint she had a bout of pneumonia and a flareup of planta fasciitis.  She is doing well on the higher dose of verapamil 300 mg p.o. daily but with effort related activities she still experiences palpitations and dyspnea on exertion.  Overall intensity frequency and duration has improved.  Home blood pressures are around 127 mmHg when checked.  FUNCTIONAL STATUS: Use to walk 1-2 miles 3-4 times per week weather permitting.   ALLERGIES: Allergies  Allergen Reactions   Dexlansoprazole Diarrhea, Other (See Comments) and Nausea And Vomiting    N/V/D   Oxycodone-Acetaminophen Other (See Comments)    extreme headache   Breztri Aerosphere [Budeson-Glycopyrrol-Formoterol]     Severe headache and visual changes   Percocet [Oxycodone-Acetaminophen]     Vomit and extreme headache   Latex Rash     MEDICATION LIST PRIOR TO  VISIT: Current Outpatient Medications on File Prior to Visit  Medication Sig Dispense Refill   albuterol (VENTOLIN HFA) 108 (90 Base) MCG/ACT inhaler TAKE 2 PUFFS BY MOUTH EVERY 6 HOURS AS NEEDED FOR WHEEZE OR SHORTNESS OF BREATH 8.5 each 11   aspirin EC 81 MG tablet Take 81 mg by mouth daily. Swallow whole.     Budeson-Glycopyrrol-Formoterol (BREZTRI AEROSPHERE) 160-9-4.8 MCG/ACT AERO Inhale 2 puffs into the lungs in the morning and at bedtime. 10.7 g 11   Cholecalciferol (VITAMIN D3) 1.25 MG (50000 UT) CAPS Take by mouth.     cyclobenzaprine (FLEXERIL) 10 MG tablet Take 10 mg by mouth at bedtime.     esomeprazole (NEXIUM) 20 MG packet Take 20 mg by mouth in the morning and at bedtime.     irbesartan (AVAPRO) 150 MG tablet TAKE 1 TABLET BY MOUTH EVERY DAY 90 tablet 3   Melatonin 5 MG CAPS Take 5 mg by mouth at bedtime.     meloxicam (MOBIC) 15 MG tablet TAKE 1 TABLET (15 MG TOTAL) BY MOUTH DAILY. 30 tablet 3   rosuvastatin (CRESTOR) 10 MG tablet TAKE 1 TABLET BY MOUTH EVERYDAY AT BEDTIME 90 tablet 3   trospium (SANCTURA) 20 MG tablet Take 20 mg by mouth at bedtime.     No current facility-administered medications on file prior to visit.    PAST MEDICAL HISTORY: Past Medical History:  Diagnosis Date   Allergy    Coronary atherosclerosis due to calcified coronary lesion    GERD (gastroesophageal reflux disease)    Heartburn    High blood pressure    History of COVID-19  Overactive bladder     PAST SURGICAL HISTORY: Past Surgical History:  Procedure Laterality Date   ABDOMINAL HYSTERECTOMY     CHOLECYSTECTOMY     COLONOSCOPY  2010   Dr. Aviva Signs: four polyps in rectum and sigmoid colon (hyperplastic polyps)   COLONOSCOPY N/A 02/07/2020   Procedure: COLONOSCOPY;  Surgeon: Daneil Dolin, MD;  [facility];  Service: Endoscopy;  Laterality: N/A;  7:30   ESOPHAGOGASTRODUODENOSCOPY N/A 06/08/2014   SLF: 1. No obvious source for dyspepsia identified. 2. Medium sized  hiatal hernia 3. Mild non-erosive gastritis.   ESOPHAGOGASTRODUODENOSCOPY  2010   Dr. Aviva Signs: hiatal hernia   EUS  05/21/10   outlaw:hyperechoic liver mild consistent with steatosis otherwise normal US   GALLBLADDER SURGERY  2004   HEMOSTASIS CLIP PLACEMENT  02/07/2020   Procedure: HEMOSTASIS CLIP PLACEMENT;  Surgeon: Daneil Dolin, MD;  [facility];  Service: Endoscopy;;   LAPAROSCOPIC NISSEN FUNDOPLICATION N/A 16/05/9603   Procedure: LAPAROSCOPIC NISSEN FUNDOPLICATION;  Surgeon: Jamesetta So, MD;  Location: [facility];  Service: General;  Laterality: N/A;   POLYPECTOMY  02/07/2020   Procedure: POLYPECTOMY;  Surgeon: Daneil Dolin, MD;  [facility];  Service: Endoscopy;;   TONSILLECTOMY     VIDEO BRONCHOSCOPY Bilateral 01/23/2014   Procedure: VIDEO BRONCHOSCOPY WITHOUT FLUORO;  Surgeon: Collene Gobble, MD;  Location: [facility];  Service: Cardiopulmonary;  Laterality: Bilateral;    FAMILY HISTORY: The patient's family history includes Allergies in her brother and mother; Cancer in her mother; Heart disease in her father, maternal grandfather, and paternal grandmother.   SOCIAL HISTORY:  The patient  reports that she quit smoking about 14 years ago. Her smoking use included cigarettes. She has a 45.00 pack-year smoking history. She has never used smokeless tobacco. She reports that she does not drink alcohol and does not use drugs.  Review of Systems  Cardiovascular:  Positive for dyspnea on exertion (improved) and palpitations (improved). Negative for chest pain, claudication, irregular heartbeat, leg swelling, near-syncope, orthopnea, paroxysmal nocturnal dyspnea and syncope.  Respiratory:  Negative for shortness of breath.   Hematologic/Lymphatic: Negative for bleeding problem.  Musculoskeletal:  Negative for muscle cramps and myalgias.  Neurological:  Negative for dizziness and light-headedness.    PHYSICAL EXAM:    09/10/2022   11:15 AM 09/10/2022    10:50 AM 03/10/2022   10:19 AM  Vitals with BMI  Height  '5\' 4"'$  '5\' 4"'$   Weight   181 lbs 3 oz  BMI   54.09  Systolic 811 914 782  Diastolic 81 89 68  Pulse 80 68 80   Physical Exam  Neck: No JVD present.  Cardiovascular: Normal rate, regular rhythm, S1 normal, S2 normal, intact distal pulses and normal pulses. Exam reveals no gallop, no S3 and no S4.  No murmur heard. Pulmonary/Chest: Effort normal and breath sounds normal. No stridor.  Abdominal: Soft. Bowel sounds are normal. She exhibits no distension. There is no abdominal tenderness.  Musculoskeletal:        General: No edema.   RADIOLOGY: CT chest high resolution 06/24/2018: 1. New clustered nodularity/tree-in-bud opacities in the medial basilar right upper lobe and peripheral left upper lobe with dominant new 6 mm peripheral left upper lobe nodule. While probably inflammatory related to progression of MAI given the below findings, non-contrast chest CT at 3-6 months is recommended in this high-risk patient with smoking related changes in the lungs. This recommendation follows the consensus statement: Guidelines for  Management of Incidental Pulmonary Nodules Detected on CT Images: From the Fleischner Society 2017; Radiology 2017; 284:228-243. 2. 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 3. Moderate emphysema with mild diffuse bronchial wall thickening, suggesting COPD. 4. Small hiatal hernia. Fluid in the thoracic esophagus, suggesting esophageal dysmotility and/or gastroesophageal reflux. Nonspecific mild circumferential wall thickening in the lower thoracic esophagus, stable, most commonly due to reflux esophagitis, with Barrett's esophagus or neoplasm not excluded. 5. One vessel coronary atherosclerosis. Aortic Atherosclerosis (ICD10-I70.0) and Emphysema (ICD10-J43.9  CARDIAC  DATABASE: EKG: 09/10/2022: Normal sinus rhythm, 81 bpm, RBBB, LAE, rare PVCs.  Echocardiogram: 08/06/2021: Normal LV systolic function with visual EF 60-65%. Left ventricle cavity is normal in size. Normal left ventricular wall thickness. Normal global wall motion. Normal diastolic filling pattern, normal LAP.  Trace tricuspid regurgitation. No evidence of pulmonary hypertension. Insignificant pericardial effusion. There is no hemodynamic significance. Compared to study 07/22/2016 LVEF remains stable, MR and TR have improved, otherwise no significant change.   Stress Testing:  Nuclear stress test  [08/06/2016]: 1. The resting electrocardiogram demonstrated normal sinus rhythm, RBBB, no resting arrhythmias and normal rest repolarization. The stress electrocardiogram was normal. Patient exercised on Bruce protocol for 7:00 minutes and achieved 7.05 METS. Stress test terminated due to dyspnea and 91% MPHR achieved (Target HR >85%). Hypertensive Bp response (200/98 mm Hg peak). 2. Myocardial perfusion imaging is normal. Overall left ventricular systolic function was normal without regional wall motion abnormalities. The left ventricular ejection fraction was 71%.  Heart Catheterization: None  3 day extended Holter monitor: March 2023 Dominant rhythm normal sinus., followed by sinus tachycardia (10%). Heart rate 55-133 bpm.  Avg HR 83 bpm. No atrial fibrillation, supraventricular tachycardia, ventricular tachycardia, high grade AV block, pauses (3 seconds or longer). Total ventricular ectopic burden 5% (predominantly isolated PVCs but also present in ventricular bigeminy/trigeminal pattern). Total supraventricular ectopic burden <1%. Patient triggered events: 0.    LABORATORY DATA: External Labs: Collected: February 13, 2020 Creatinine 0.8 mg/dL. eGFR: 72 mL/min per 1.73 m Potassium 4.7  Hemoglobin 16 g/dL, hematocrit 49.9% Lipid profile: Total cholesterol 153, triglycerides 93, HDL 55, LDL  79, non HDL 98 Hemoglobin A1c: 5.8%  External labs: Collected 04/01/2021. Available in Care Everywhere. COMPLETE METABOLIC   9833-82-50    GLUCOSE 107   60 - 110  BUN 26   5 - 23  CREATININE 1.0   0.3 - 1.5  eGFR Non-African American 55.5   <  eGFR African American 67.1   <  SODIUM 142   135 - 148  POTASSIUM 4.8   3.5 - 5.3  CHLORIDE 106   80 - 111  CO2 24   15 - 35  CALCIUM 9.0   7.0 - 10.5  TOTAL PROTEIN 6.4   6.0 - 8.5  ALBUMIN 3.7   2.0 - 5.5  AST 16   7 - 45  ALT 25   5 - 40  ALK PHOS 97   37 - 137  TOTAL BILIRUBIN 0.5   0.0 - 1.5  CBC   2021-04-01    WBC 9.95   4.10 - 10.90  LYM 3.0   0.6 - 4.1  BASO% 0.8   0.0 - 2.0  EOS 0.4   0.0 - 0.4  BASO 0.1   0.0 - 0.2  EOS% 4.5   0.0 - 7.8  LYM% 29.7   10.0 - 58.5  RBC 5.2  4.2 - 6.3  HGB 15.1   12.0 - 18.0  HCT 46.3   37.0 - 51.0  MCV 89.5   80.0 - 97.0  MCH 29.1   26.0 - 32.0  MCHC 32.5   31.0 - 36.0  RDW 12.7   12.3 - 15.4  PLT 241   140 - 440  MPV 7.3   6.9 - 10.6  NEU 5.6   1.4 - 7.0  MONO% 8.5   4.6 - 12.4  MONO 0.8   0.1 - 0.9  NEU% 56.5   43.3 - 71.9  LIPID   2021-04-01    CHOLESTEROL 152   100 - 200  TRIGLYCERIDES 109   30 - 149  HDL 58   40 - 60  LDL 72   <  CHOL/HDL RATIO 2.6   <  LDL/HDL RATIO 1.2   1.7 - 2.5  NON-HDL 94.0   <  A1C   2021-04-01    A1C 6.2      IMPRESSION:    ICD-10-CM   1. PVC (premature ventricular contraction)  I49.3 EKG 12-Lead    Verapamil HCl CR 300 MG CP24    2. Dyspnea on exertion  R06.09     3. Coronary atherosclerosis due to calcified coronary lesion of native artery  I25.10    I25.84     4. Pure hypercholesterolemia  E78.00     5. Essential hypertension  I10     6. Right bundle branch block  I45.10     7. Former smoker  Z87.891        RECOMMENDATIONS: [name] is a 67 y.o. female whose past medical history and cardiovascular risk factors include: tachycardia, hypertension, hyperlipidemia, coronary artery atherosclerosis due to calcified plaque,  mitral regurgitation, postmenopausal female, advanced age.  PVC (premature ventricular contraction) Prior history of 5% PVC burden. EKG today notes sinus rhythm with rare PVCs. Currently on verapamil and overall her symptoms are well-controlled. At times with exertion she does experience palpitations and dyspnea but overall intensity frequency and duration has not worsened. We discussed considering repeat monitor as a 1 year follow-up to reevaluate her PVC burden versus ischemic workup given her risk factors and continued dyspnea.  However, patient would like to hold off on additional testing at this time predominantly due to it being cost prohibitive.  Dyspnea on exertion Patient would like to hold off on repeating a Zio patch or cardiac stress test at this time for reasons mentioned above. Patient is asked to keep a log of her blood pressures and to review with PCP to see if additional medication titration is warranted. She will also discuss with PCP regarding noncardiac causes of dyspnea given her history of COVID and recent pneumonia.  Coronary atherosclerosis due to calcified coronary lesion of native artery Currently on aspirin and statin therapy. Reviewed the results of the last echo and stress test.  Pure hypercholesterolemia Currently on rosuvastatin.   She denies myalgia or other side effects. No recent lipid profile available for review.   Currently managed by primary care provider.  Essential hypertension Blood pressures are not well-controlled. Home blood pressures are better controlled but should be more cognizant with regards to checking it. Continue current medical therapy.   FINAL MEDICATION LIST END OF ENCOUNTER: Meds ordered this encounter  Medications   Verapamil HCl CR 300 MG CP24    Sig: TAKE 1 CAPSULE BY MOUTH EVERYDAY AT BEDTIME    Dispense:  90 capsule    Refill:  0    Medications Discontinued During This Encounter  Medication Reason   metroNIDAZOLE  (METROCREAM) 0.75 % cream    Verapamil HCl CR 300 MG CP24 Reorder     Current Outpatient Medications:    albuterol (VENTOLIN HFA) 108 (90 Base) MCG/ACT inhaler, TAKE 2 PUFFS BY MOUTH EVERY 6 HOURS AS NEEDED FOR WHEEZE OR SHORTNESS OF BREATH, Disp: 8.5 each, Rfl: 11   aspirin EC 81 MG tablet, Take 81 mg by mouth daily. Swallow whole., Disp: , Rfl:    Budeson-Glycopyrrol-Formoterol (BREZTRI AEROSPHERE) 160-9-4.8 MCG/ACT AERO, Inhale 2 puffs into the lungs in the morning and at bedtime., Disp: 10.7 g, Rfl: 11   Cholecalciferol (VITAMIN D3) 1.25 MG (50000 UT) CAPS, Take by mouth., Disp: , Rfl:    cyclobenzaprine (FLEXERIL) 10 MG tablet, Take 10 mg by mouth at bedtime., Disp: , Rfl:    esomeprazole (NEXIUM) 20 MG packet, Take 20 mg by mouth in the morning and at bedtime., Disp: , Rfl:    irbesartan (AVAPRO) 150 MG tablet, TAKE 1 TABLET BY MOUTH EVERY DAY, Disp: 90 tablet, Rfl: 3   Melatonin 5 MG CAPS, Take 5 mg by mouth at bedtime., Disp: , Rfl:    meloxicam (MOBIC) 15 MG tablet, TAKE 1 TABLET (15 MG TOTAL) BY MOUTH DAILY., Disp: 30 tablet, Rfl: 3   rosuvastatin (CRESTOR) 10 MG tablet, TAKE 1 TABLET BY MOUTH EVERYDAY AT BEDTIME, Disp: 90 tablet, Rfl: 3   trospium (SANCTURA) 20 MG tablet, Take 20 mg by mouth at bedtime., Disp: , Rfl:    Verapamil HCl CR 300 MG CP24, TAKE 1 CAPSULE BY MOUTH EVERYDAY AT BEDTIME, Disp: 90 capsule, Rfl: 0  Orders Placed This Encounter  Procedures   EKG 12-Lead   --Continue cardiac medications as reconciled in final medication list. --Return in about 6 months (around 03/11/2023) for Follow up, Dyspnea, PVC. Or sooner if needed. --Continue follow-up with your primary care physician regarding the management of your other chronic comorbid conditions.  Patient's questions and concerns were addressed to her satisfaction. She voices understanding of the instructions provided during this encounter.   This note was created using a voice recognition software as a result there  may be grammatical errors inadvertently enclosed that do not reflect the nature of this encounter. Every attempt is made to correct such errors.  Rex Kras, [place], [facility]  Pager: [phone] Office: [phone]

## 2022-09-22 ENCOUNTER — Other Ambulatory Visit: Payer: Self-pay | Admitting: Cardiology

## 2022-09-22 DIAGNOSIS — I10 Essential (primary) hypertension: Secondary | ICD-10-CM

## 2022-10-14 ENCOUNTER — Other Ambulatory Visit (HOSPITAL_COMMUNITY): Payer: Self-pay | Admitting: Internal Medicine

## 2022-10-14 DIAGNOSIS — Z1231 Encounter for screening mammogram for malignant neoplasm of breast: Secondary | ICD-10-CM

## 2022-10-15 ENCOUNTER — Other Ambulatory Visit: Payer: Self-pay

## 2022-10-15 DIAGNOSIS — I493 Ventricular premature depolarization: Secondary | ICD-10-CM

## 2022-10-16 ENCOUNTER — Other Ambulatory Visit: Payer: Self-pay | Admitting: Podiatry

## 2022-10-19 ENCOUNTER — Ambulatory Visit: Payer: Medicare Other

## 2022-10-19 DIAGNOSIS — I493 Ventricular premature depolarization: Secondary | ICD-10-CM

## 2022-11-06 ENCOUNTER — Encounter (HOSPITAL_COMMUNITY): Payer: Self-pay

## 2022-11-06 ENCOUNTER — Ambulatory Visit (HOSPITAL_COMMUNITY)
Admission: RE | Admit: 2022-11-06 | Discharge: 2022-11-06 | Disposition: A | Discharge status: Home / Self Care | Payer: Medicare Other | Source: Ambulatory Visit | Attending: Internal Medicine | Admitting: Internal Medicine

## 2022-11-06 DIAGNOSIS — Z1231 Encounter for screening mammogram for malignant neoplasm of breast: Secondary | ICD-10-CM | POA: Diagnosis present

## 2022-12-08 NOTE — Progress Notes (Signed)
Called patient to inform her about her monitor. Patient understood

## 2023-01-05 ENCOUNTER — Encounter: Payer: Self-pay | Admitting: *Deleted

## 2023-01-06 ENCOUNTER — Other Ambulatory Visit: Payer: Self-pay | Admitting: Cardiology

## 2023-01-06 DIAGNOSIS — I493 Ventricular premature depolarization: Secondary | ICD-10-CM

## 2023-02-24 ENCOUNTER — Other Ambulatory Visit: Payer: Self-pay | Admitting: Podiatry

## 2023-03-09 ENCOUNTER — Encounter: Payer: Self-pay | Admitting: Cardiology

## 2023-03-11 ENCOUNTER — Ambulatory Visit: Payer: Medicare Other | Admitting: Cardiology

## 2023-03-11 ENCOUNTER — Encounter: Payer: Self-pay | Admitting: Cardiology

## 2023-03-11 VITALS — BP 137/67 | HR 82 | Resp 16 | Ht 64.0 in | Wt 180.0 lb

## 2023-03-11 DIAGNOSIS — I10 Essential (primary) hypertension: Secondary | ICD-10-CM

## 2023-03-11 DIAGNOSIS — Z87891 Personal history of nicotine dependence: Secondary | ICD-10-CM

## 2023-03-11 DIAGNOSIS — E7800 Pure hypercholesterolemia, unspecified: Secondary | ICD-10-CM

## 2023-03-11 DIAGNOSIS — I493 Ventricular premature depolarization: Secondary | ICD-10-CM

## 2023-03-11 DIAGNOSIS — I2510 Atherosclerotic heart disease of native coronary artery without angina pectoris: Secondary | ICD-10-CM

## 2023-03-11 DIAGNOSIS — I4510 Unspecified right bundle-branch block: Secondary | ICD-10-CM

## 2023-03-11 DIAGNOSIS — R0609 Other forms of dyspnea: Secondary | ICD-10-CM

## 2023-03-11 NOTE — Progress Notes (Signed)
[name] Date of Birth: October 06, 1955 MRN: [id number] Primary Care Provider:Tisovec, Adelfa Koh, MD Former Cardiology Providers: Altamese [place], APRN, FNP-C Primary Cardiologist: Tessa Lerner, DO, [facility] (established care August 30, 2020)  Date: 03/11/23 Last Office Visit: 09/10/2022  Chief Complaint  Patient presents with   PVC   Follow-up    6 month    HPI  [name] is a 67 y.o.  female whose past medical history and cardiovascular risk factors include: History of tachycardia, Hx of COVID x2, hypertension, hyperlipidemia, coronary artery atherosclerosis due to calcified plaque, premature ventricular contractions, mitral regurgitation, postmenopausal female, advanced age.  Patient is being followed by the practice for management of tachycardia/PVCs as well as coronary artery calcification.  In the past she was noted to have a PVC burden of approximately 5% majority of which were multifocal PVCs and she was symptomatic.  She was started on verapamil and the dose has been uptitrated as she is now asymptomatic.  Since last office visit she had a repeat Zio patch which notes an improvement in overall PVC burden.  She also had her yearly well visit with PCP labs independently reviewed and noted below for further reference.  Denies any anginal chest pain or heart failure symptoms.  Dyspnea on exertion remains chronic and stable.  Home blood pressures are well-controlled.  ALLERGIES: Allergies  Allergen Reactions   Dexlansoprazole Diarrhea, Other (See Comments) and Nausea And Vomiting    N/V/D   Oxycodone-Acetaminophen Other (See Comments)    extreme headache   Breztri Aerosphere [Budeson-Glycopyrrol-Formoterol]     Severe headache and visual changes   Percocet [Oxycodone-Acetaminophen]     Vomit and extreme headache   Latex Rash     MEDICATION LIST PRIOR TO VISIT: Current Outpatient Medications on File Prior to Visit  Medication Sig Dispense Refill   albuterol  (VENTOLIN HFA) 108 (90 Base) MCG/ACT inhaler TAKE 2 PUFFS BY MOUTH EVERY 6 HOURS AS NEEDED FOR WHEEZE OR SHORTNESS OF BREATH 8.5 each 11   aspirin EC 81 MG tablet Take 81 mg by mouth daily. Swallow whole.     Budeson-Glycopyrrol-Formoterol (BREZTRI AEROSPHERE) 160-9-4.8 MCG/ACT AERO Inhale 2 puffs into the lungs in the morning and at bedtime. 10.7 g 11   cetirizine (ZYRTEC) 10 MG chewable tablet Chew 10 mg by mouth daily.     Cholecalciferol (VITAMIN D3) 1.25 MG (50000 UT) CAPS Take by mouth.     esomeprazole (NEXIUM) 20 MG packet Take 20 mg by mouth in the morning and at bedtime.     irbesartan (AVAPRO) 150 MG tablet TAKE 1 TABLET BY MOUTH EVERY DAY 90 tablet 3   Melatonin 5 MG CAPS Take 5 mg by mouth at bedtime.     meloxicam (MOBIC) 15 MG tablet TAKE 1 TABLET (15 MG TOTAL) BY MOUTH DAILY. 30 tablet 3   metroNIDAZOLE (METROCREAM) 0.75 % cream Apply 1 Application topically 2 (two) times daily.     rosuvastatin (CRESTOR) 20 MG tablet Take 20 mg by mouth daily.     trospium (SANCTURA) 20 MG tablet Take 20 mg by mouth at bedtime.     Verapamil HCl CR 300 MG CP24 TAKE 1 CAPSULE BY MOUTH EVERYDAY AT BEDTIME 90 capsule 0   cyclobenzaprine (FLEXERIL) 10 MG tablet Take 10 mg by mouth at bedtime. (Patient not taking: Reported on 03/11/2023)     No current facility-administered medications on file prior to visit.    PAST MEDICAL HISTORY: Past Medical History:  Diagnosis Date   Allergy  Coronary atherosclerosis due to calcified coronary lesion    GERD (gastroesophageal reflux disease)    Heartburn    High blood pressure    History of COVID-19    Overactive bladder     PAST SURGICAL HISTORY: Past Surgical History:  Procedure Laterality Date   ABDOMINAL HYSTERECTOMY     CHOLECYSTECTOMY     COLONOSCOPY  2010   Dr. Franky Macho: four polyps in rectum and sigmoid colon (hyperplastic polyps)   COLONOSCOPY N/A 02/07/2020   Procedure: COLONOSCOPY;  Surgeon: Corbin Ade, MD;  [facility];  Service: Endoscopy;  Laterality: N/A;  7:30   ESOPHAGOGASTRODUODENOSCOPY N/A 06/08/2014   SLF: 1. No obvious source for dyspepsia identified. 2. Medium sized hiatal hernia 3. Mild non-erosive gastritis.   ESOPHAGOGASTRODUODENOSCOPY  2010   Dr. Franky Macho: hiatal hernia   EUS  05/21/10   outlaw:hyperechoic liver mild consistent with steatosis otherwise normal US   GALLBLADDER SURGERY  2004   HEMOSTASIS CLIP PLACEMENT  02/07/2020   Procedure: HEMOSTASIS CLIP PLACEMENT;  Surgeon: Corbin Ade, MD;  [facility];  Service: Endoscopy;;   LAPAROSCOPIC NISSEN FUNDOPLICATION N/A 08/03/2014   Procedure: LAPAROSCOPIC NISSEN FUNDOPLICATION;  Surgeon: Dalia Heading, MD;  Location: [facility];  Service: General;  Laterality: N/A;   POLYPECTOMY  02/07/2020   Procedure: POLYPECTOMY;  Surgeon: Corbin Ade, MD;  [facility];  Service: Endoscopy;;   TONSILLECTOMY     VIDEO BRONCHOSCOPY Bilateral 01/23/2014   Procedure: VIDEO BRONCHOSCOPY WITHOUT FLUORO;  Surgeon: Leslye Peer, MD;  Location: [facility];  Service: Cardiopulmonary;  Laterality: Bilateral;    FAMILY HISTORY: The patient's family history includes Allergies in her brother and mother; Cancer in her mother; Heart disease in her father, maternal grandfather, and paternal grandmother; Heart failure in her mother.   SOCIAL HISTORY:  The patient  reports that she quit smoking about 14 years ago. Her smoking use included cigarettes. She started smoking about 44 years ago. She has a 45 pack-year smoking history. She has never used smokeless tobacco. She reports that she does not drink alcohol and does not use drugs.  Review of Systems  Cardiovascular:  Positive for dyspnea on exertion (Stable). Negative for chest pain, claudication, irregular heartbeat, leg swelling, near-syncope, orthopnea, palpitations, paroxysmal nocturnal dyspnea and syncope.  Respiratory:  Negative for shortness of breath.    Hematologic/Lymphatic: Negative for bleeding problem.  Musculoskeletal:  Negative for muscle cramps and myalgias.  Neurological:  Negative for dizziness and light-headedness.    PHYSICAL EXAM:    03/11/2023   10:32 AM 09/10/2022   11:15 AM 09/10/2022   10:50 AM  Vitals with BMI  Height 5\' 4"   5\' 4"   Weight 180 lbs    BMI 30.88    Systolic 137 150 789  Diastolic 67 81 89  Pulse 82 80 68   Physical Exam  Constitutional: No distress.  Age appropriate, hemodynamically stable.   Neck: No JVD present.  Cardiovascular: Normal rate, regular rhythm, S1 normal, S2 normal, intact distal pulses and normal pulses. Exam reveals no gallop, no S3 and no S4.  No murmur heard. Pulmonary/Chest: Effort normal and breath sounds normal. No stridor. She has no wheezes. She has no rales.  Abdominal: Soft. Bowel sounds are normal. She exhibits no distension. There is no abdominal tenderness.  Musculoskeletal:        General: No edema.     Cervical back: Neck supple.  Neurological: She is alert and oriented  to person, place, and time. She has intact cranial nerves (2-12).  Skin: Skin is warm and moist.   RADIOLOGY: CT chest high resolution 06/24/2018: 1. New clustered nodularity/tree-in-bud opacities in the medial basilar right upper lobe and peripheral left upper lobe with dominant new 6 mm peripheral left upper lobe nodule. While probably inflammatory related to progression of MAI given the below findings, non-contrast chest CT at 3-6 months is recommended in this high-risk patient with smoking related changes in the lungs. This recommendation follows the consensus statement: Guidelines for Management of Incidental Pulmonary Nodules Detected on CT Images: From the Fleischner Society 2017; Radiology 2017; 284:228-243. 2. 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 3. Moderate emphysema with mild diffuse bronchial wall thickening, suggesting COPD. 4. Small hiatal hernia. Fluid in the thoracic esophagus, suggesting esophageal dysmotility and/or gastroesophageal reflux. Nonspecific mild circumferential wall thickening in the lower thoracic esophagus, stable, most commonly due to reflux esophagitis, with Barrett's esophagus or neoplasm not excluded. 5. One vessel coronary atherosclerosis. Aortic Atherosclerosis (ICD10-I70.0) and Emphysema (ICD10-J43.9  CARDIAC DATABASE: EKG: 09/10/2022: Normal sinus rhythm, 81 bpm, RBBB, LAE, rare PVCs. March 11, 2023: Sinus rhythm, 70 bpm, right bundle branch block.  Echocardiogram: 08/06/2021: Normal LV systolic function with visual EF 60-65%. Left ventricle cavity is normal in size. Normal left ventricular wall thickness. Normal global wall motion. Normal diastolic filling pattern, normal LAP.  Trace tricuspid regurgitation. No evidence of pulmonary hypertension. Insignificant pericardial effusion. There is no hemodynamic significance. Compared to study 07/22/2016 LVEF remains stable, MR and TR have improved, otherwise no significant change.   Stress Testing:  Nuclear stress test  [08/06/2016]: 1. The resting electrocardiogram demonstrated normal sinus rhythm, RBBB, no resting arrhythmias and normal rest repolarization. The stress electrocardiogram was normal. Patient exercised on Bruce protocol for 7:00 minutes and achieved 7.05 METS. Stress test terminated due to dyspnea and 91% MPHR achieved (Target HR >85%). Hypertensive Bp response (200/98 mm Hg peak). 2. Myocardial perfusion imaging is normal. Overall left ventricular systolic function was normal without regional wall motion abnormalities. The left ventricular ejection fraction was 71%.  Heart Catheterization: None  3 day extended Holter monitor: March 2023 Dominant rhythm normal sinus., followed by sinus tachycardia (10%). Heart rate 55-133 bpm.  Avg HR  83 bpm. No atrial fibrillation, supraventricular tachycardia, ventricular tachycardia, high grade AV block, pauses (3 seconds or longer). Total ventricular ectopic burden 5% (predominantly isolated PVCs but also present in ventricular bigeminy/trigeminal pattern). Total supraventricular ectopic burden <1%. Patient triggered events: 0.    Cardiac monitor (Zio Patch): October 19, 2022 - October 27, 2022 Dominant rhythm sinus, followed by tachycardia (9% burden). Heart rate 60-162 bpm.  Avg HR 81 bpm. No atrial fibrillation, supraventricular tachycardia, ventricular tachycardia, high grade AV block, pauses (3 seconds or longer). Total ventricular ectopic burden 3.5% (as isolated beats). Total supraventricular ectopic burden <1%. Rare episodes of PSVT.  Fastest episode was 5 beats, 2 seconds, max heart rate 162 bpm (asymptomatic) on 10/26/2022 at 9:49 AM Patient triggered events: 0.  These did not correlate with any significant dysrhythmias.  LABORATORY DATA: External Labs: Collected: February 13, 2020 Creatinine 0.8 mg/dL. eGFR: 72 mL/min per 1.73 m Potassium 4.7  Hemoglobin 16 g/dL, hematocrit 96.0% Lipid profile: Total cholesterol 153, triglycerides 93, HDL 55, LDL 79, non HDL 98 Hemoglobin A1c: 5.8%  External labs: Collected 04/01/2021. Available in Care Everywhere. COMPLETE METABOLIC   2021-04-01    GLUCOSE  107   60 - 110  BUN 26   5 - 23  CREATININE 1.0   0.3 - 1.5  eGFR Non-African American 55.5   <  eGFR African American 67.1   <  SODIUM 142   135 - 148  POTASSIUM 4.8   3.5 - 5.3  CHLORIDE 106   80 - 111  CO2 24   15 - 35  CALCIUM 9.0   7.0 - 10.5  TOTAL PROTEIN 6.4   6.0 - 8.5  ALBUMIN 3.7   2.0 - 5.5  AST 16   7 - 45  ALT 25   5 - 40  ALK PHOS 97   37 - 137  TOTAL BILIRUBIN 0.5   0.0 - 1.5  CBC   2021-04-01    WBC 9.95   4.10 - 10.90  LYM 3.0   0.6 - 4.1  BASO% 0.8   0.0 - 2.0  EOS 0.4   0.0 - 0.4  BASO 0.1   0.0 - 0.2  EOS% 4.5   0.0 - 7.8  LYM% 29.7   10.0 - 58.5   RBC 5.2   4.2 - 6.3  HGB 15.1   12.0 - 18.0  HCT 46.3   37.0 - 51.0  MCV 89.5   80.0 - 97.0  MCH 29.1   26.0 - 32.0  MCHC 32.5   31.0 - 36.0  RDW 12.7   12.3 - 15.4  PLT 241   140 - 440  MPV 7.3   6.9 - 10.6  NEU 5.6   1.4 - 7.0  MONO% 8.5   4.6 - 12.4  MONO 0.8   0.1 - 0.9  NEU% 56.5   43.3 - 71.9  LIPID   2021-04-01    CHOLESTEROL 152   100 - 200  TRIGLYCERIDES 109   30 - 149  HDL 58   40 - 60  LDL 72   <  CHOL/HDL RATIO 2.6   <  LDL/HDL RATIO 1.2   1.7 - 2.5  NON-HDL 94.0   <  A1C   2021-04-01    A1C 6.2     External Labs: Collected: March 03, 2023 provided by PCP. Sodium 139, potassium 4.2, chloride 105, bicarb 24. Creatinine 0.8.,  BUN 20. AST 17, ALT 26, alkaline phosphatase 108. Hemoglobin 15.1, hematocrit 46.8% Total cholesterol 166, triglycerides 101, HDL 58, LDL 90, non-HDL 100 A1c 6.4.  IMPRESSION:    ICD-10-CM   1. PVC (premature ventricular contraction)  I49.3 EKG 12-Lead    2. Dyspnea on exertion  R06.09     3. Coronary atherosclerosis due to calcified coronary lesion of native artery  I25.10    I25.84     4. Pure hypercholesterolemia  E78.00     5. Essential hypertension  I10     6. Right bundle branch block  I45.10     7. Former smoker  Z87.891        RECOMMENDATIONS: [name] is a 67 y.o. female whose past medical history and cardiovascular risk factors include: tachycardia, hypertension, hyperlipidemia, coronary artery atherosclerosis due to calcified plaque, mitral regurgitation, postmenopausal female, advanced age.  PVC (premature ventricular contraction) PVC burden March 2023 5%. PVC burden February 2024 3.5% Continue verapamil at the current dose. Remains asymptomatic on the current dose of medical therapy. Monitor for now.  Dyspnea on exertion Chronic and stable. Has undergone appropriate cardiovascular workup as outlined above. May consider pulmonary evaluation given her CT findings back in  2019. If symptoms are more  noticeable or progressive could repeat her ischemic workup.  Coronary atherosclerosis due to calcified coronary lesion of native artery Currently on aspirin and statin therapy. Reviewed the results of the last echo and stress test.  Pure hypercholesterolemia Currently on rosuvastatin.   PCP recently increased her rosuvastatin from 10 mg p.o. daily to 20 mg p.o. daily, I agree based on recent lipid profile and her risk factors. She denies myalgia or other side effects. Currently managed by primary care provider.  Essential hypertension Blood pressures are within acceptable limits. Medications reconciled. No changes warranted at this time. Did confirm the patient that she is Avapro and not losartan.  FINAL MEDICATION LIST END OF ENCOUNTER: No orders of the defined types were placed in this encounter.   Medications Discontinued During This Encounter  Medication Reason   rosuvastatin (CRESTOR) 10 MG tablet    losartan (COZAAR) 50 MG tablet Patient Preference     Current Outpatient Medications:    albuterol (VENTOLIN HFA) 108 (90 Base) MCG/ACT inhaler, TAKE 2 PUFFS BY MOUTH EVERY 6 HOURS AS NEEDED FOR WHEEZE OR SHORTNESS OF BREATH, Disp: 8.5 each, Rfl: 11   aspirin EC 81 MG tablet, Take 81 mg by mouth daily. Swallow whole., Disp: , Rfl:    Budeson-Glycopyrrol-Formoterol (BREZTRI AEROSPHERE) 160-9-4.8 MCG/ACT AERO, Inhale 2 puffs into the lungs in the morning and at bedtime., Disp: 10.7 g, Rfl: 11   cetirizine (ZYRTEC) 10 MG chewable tablet, Chew 10 mg by mouth daily., Disp: , Rfl:    Cholecalciferol (VITAMIN D3) 1.25 MG (50000 UT) CAPS, Take by mouth., Disp: , Rfl:    esomeprazole (NEXIUM) 20 MG packet, Take 20 mg by mouth in the morning and at bedtime., Disp: , Rfl:    irbesartan (AVAPRO) 150 MG tablet, TAKE 1 TABLET BY MOUTH EVERY DAY, Disp: 90 tablet, Rfl: 3   Melatonin 5 MG CAPS, Take 5 mg by mouth at bedtime., Disp: , Rfl:    meloxicam (MOBIC) 15 MG tablet, TAKE 1 TABLET (15 MG  TOTAL) BY MOUTH DAILY., Disp: 30 tablet, Rfl: 3   metroNIDAZOLE (METROCREAM) 0.75 % cream, Apply 1 Application topically 2 (two) times daily., Disp: , Rfl:    rosuvastatin (CRESTOR) 20 MG tablet, Take 20 mg by mouth daily., Disp: , Rfl:    trospium (SANCTURA) 20 MG tablet, Take 20 mg by mouth at bedtime., Disp: , Rfl:    Verapamil HCl CR 300 MG CP24, TAKE 1 CAPSULE BY MOUTH EVERYDAY AT BEDTIME, Disp: 90 capsule, Rfl: 0   cyclobenzaprine (FLEXERIL) 10 MG tablet, Take 10 mg by mouth at bedtime. (Patient not taking: Reported on 03/11/2023), Disp: , Rfl:   Orders Placed This Encounter  Procedures   EKG 12-Lead   --Continue cardiac medications as reconciled in final medication list. --Return in about 1 year (around 03/10/2024) for Annual follow up visit PVCs. Or sooner if needed. --Continue follow-up with your primary care physician regarding the management of your other chronic comorbid conditions.  Patient's questions and concerns were addressed to her satisfaction. She voices understanding of the instructions provided during this encounter.   This note was created using a voice recognition software as a result there may be grammatical errors inadvertently enclosed that do not reflect the nature of this encounter. Every attempt is made to correct such errors.  Tessa Lerner, [place], [facility]  Pager:  [phone] Office: [phone]

## 2023-03-29 NOTE — Progress Notes (Unsigned)
GI Office Note    Referring Provider: Tisovec, Adelfa Koh, MD Primary Care Physician:  Gaspar Garbe, MD  Primary Gastroenterologist: Roetta Sessions, MD   Chief Complaint   No chief complaint on file.    History of Present Illness   [name] is a 67 y.o. female presenting today       Colonoscopy 01/2020: -one 8mm polyp at hepatic flexure -one 15mm polyp in descending colon -diverticulosis -tubular adenomas,  -next colonoscopy 3 years  Medications   Current Outpatient Medications  Medication Sig Dispense Refill   albuterol (VENTOLIN HFA) 108 (90 Base) MCG/ACT inhaler TAKE 2 PUFFS BY MOUTH EVERY 6 HOURS AS NEEDED FOR WHEEZE OR SHORTNESS OF BREATH 8.5 each 11   aspirin EC 81 MG tablet Take 81 mg by mouth daily. Swallow whole.     Budeson-Glycopyrrol-Formoterol (BREZTRI AEROSPHERE) 160-9-4.8 MCG/ACT AERO Inhale 2 puffs into the lungs in the morning and at bedtime. 10.7 g 11   cetirizine (ZYRTEC) 10 MG chewable tablet Chew 10 mg by mouth daily.     Cholecalciferol (VITAMIN D3) 1.25 MG (50000 UT) CAPS Take by mouth.     cyclobenzaprine (FLEXERIL) 10 MG tablet Take 10 mg by mouth at bedtime. (Patient not taking: Reported on 03/11/2023)     esomeprazole (NEXIUM) 20 MG packet Take 20 mg by mouth in the morning and at bedtime.     irbesartan (AVAPRO) 150 MG tablet TAKE 1 TABLET BY MOUTH EVERY DAY 90 tablet 3   Melatonin 5 MG CAPS Take 5 mg by mouth at bedtime.     meloxicam (MOBIC) 15 MG tablet TAKE 1 TABLET (15 MG TOTAL) BY MOUTH DAILY. 30 tablet 3   metroNIDAZOLE (METROCREAM) 0.75 % cream Apply 1 Application topically 2 (two) times daily.     rosuvastatin (CRESTOR) 20 MG tablet Take 20 mg by mouth daily.     trospium (SANCTURA) 20 MG tablet Take 20 mg by mouth at bedtime.     Verapamil HCl CR 300 MG CP24 TAKE 1 CAPSULE BY MOUTH EVERYDAY AT BEDTIME 90 capsule 0   No current facility-administered medications for this visit.    Allergies   Allergies as of  03/30/2023 - Review Complete 03/11/2023  Allergen Reaction Noted   Dexlansoprazole Diarrhea, Other (See Comments), and Nausea And Vomiting 04/13/2014   Oxycodone-acetaminophen Other (See Comments) 02/11/2015   Breztri aerosphere [budeson-glycopyrrol-formoterol]  07/04/2021   Percocet [oxycodone-acetaminophen]     Latex Rash 04/30/2010    Past Medical History   Past Medical History:  Diagnosis Date   Allergy    Coronary atherosclerosis due to calcified coronary lesion    GERD (gastroesophageal reflux disease)    Heartburn    High blood pressure    History of COVID-19    Overactive bladder     Past Surgical History   Past Surgical History:  Procedure Laterality Date   ABDOMINAL HYSTERECTOMY     CHOLECYSTECTOMY     COLONOSCOPY  2010   Dr. Franky Macho: four polyps in rectum and sigmoid colon (hyperplastic polyps)   COLONOSCOPY N/A 02/07/2020   Procedure: COLONOSCOPY;  Surgeon: Corbin Ade, MD;  [facility];  Service: Endoscopy;  Laterality: N/A;  7:30   ESOPHAGOGASTRODUODENOSCOPY N/A 06/08/2014   SLF: 1. No obvious source for dyspepsia identified. 2. Medium sized hiatal hernia 3. Mild non-erosive gastritis.   ESOPHAGOGASTRODUODENOSCOPY  2010   Dr. Franky Macho: hiatal hernia   EUS  05/21/10   outlaw:hyperechoic liver mild consistent  with steatosis otherwise normal US   GALLBLADDER SURGERY  2004   HEMOSTASIS CLIP PLACEMENT  02/07/2020   Procedure: HEMOSTASIS CLIP PLACEMENT;  Surgeon: Corbin Ade, MD;  [facility];  Service: Endoscopy;;   LAPAROSCOPIC NISSEN FUNDOPLICATION N/A 08/03/2014   Procedure: LAPAROSCOPIC NISSEN FUNDOPLICATION;  Surgeon: Dalia Heading, MD;  Location: [facility];  Service: General;  Laterality: N/A;   POLYPECTOMY  02/07/2020   Procedure: POLYPECTOMY;  Surgeon: Corbin Ade, MD;  [facility];  Service: Endoscopy;;   TONSILLECTOMY     VIDEO BRONCHOSCOPY Bilateral 01/23/2014   Procedure: VIDEO BRONCHOSCOPY WITHOUT  FLUORO;  Surgeon: Leslye Peer, MD;  Location: [facility];  Service: Cardiopulmonary;  Laterality: Bilateral;    Past Family History   Family History  Problem Relation Age of Onset   Heart failure Mother    Cancer Mother        lymphoma   Allergies Mother    Heart disease Father    Allergies Brother    Heart disease Maternal Grandfather    Heart disease Paternal Grandmother    Colon cancer Neg Hx     Past Social History   Social History   Socioeconomic History   Marital status: Married    Spouse name: Not on file   Number of children: 1   Years of education: Not on file   Highest education level: Not on file  Occupational History   Not on file  Tobacco Use   Smoking status: Former    Current packs/day: 0.00    Average packs/day: 1.5 packs/day for 30.0 years (45.0 ttl pk-yrs)    Types: Cigarettes    Start date: 05/24/1978    Quit date: 05/24/2008    Years since quitting: 14.8   Smokeless tobacco: Never   Tobacco comments:    quit x 6 years  Vaping Use   Vaping status: Never Used  Substance and Sexual Activity   Alcohol use: No   Drug use: No   Sexual activity: Not on file  Other Topics Concern   Not on file  Social History Narrative   Not on file   Social Determinants of Health   Financial Resource Strain: Not on file  Food Insecurity: Not on file  Transportation Needs: Not on file  Physical Activity: Not on file  Stress: Not on file  Social Connections: Not on file  Intimate Partner Violence: Not on file    Review of Systems   General: Negative for anorexia, weight loss, fever, chills, fatigue, weakness. Eyes: Negative for vision changes.  ENT: Negative for hoarseness, difficulty swallowing , nasal congestion. CV: Negative for chest pain, angina, palpitations, dyspnea on exertion, peripheral edema.  Respiratory: Negative for dyspnea at rest, dyspnea on exertion, cough, sputum, wheezing.  GI: See history of present illness. GU:  Negative for  dysuria, hematuria, urinary incontinence, urinary frequency, nocturnal urination.  MS: Negative for joint pain, low back pain.  Derm: Negative for rash or itching.  Neuro: Negative for weakness, abnormal sensation, seizure, frequent headaches, memory loss,  confusion.  Psych: Negative for anxiety, depression, suicidal ideation, hallucinations.  Endo: Negative for unusual weight change.  Heme: Negative for bruising or bleeding. Allergy: Negative for rash or hives.  Physical Exam   There were no vitals taken for this visit.   General: Well-nourished, well-developed in no acute distress.  Head: Normocephalic, atraumatic.   Eyes: Conjunctiva pink, no icterus. Mouth: Oropharyngeal mucosa moist and pink , no lesions erythema or  exudate. Neck: Supple without thyromegaly, masses, or lymphadenopathy.  Lungs: Clear to auscultation bilaterally.  Heart: Regular rate and rhythm, no murmurs rubs or gallops.  Abdomen: Bowel sounds are normal, nontender, nondistended, no hepatosplenomegaly or masses,  no abdominal bruits or hernia, no rebound or guarding.   Rectal: *** Extremities: No lower extremity edema. No clubbing or deformities.  Neuro: Alert and oriented x 4 , grossly normal neurologically.  Skin: Warm and dry, no rash or jaundice.   Psych: Alert and cooperative, normal mood and affect.  Labs   *** Imaging Studies   No results found.  Assessment       PLAN   ***   Leanna Battles. Melvyn Neth, MHS, PA-C [facility]

## 2023-03-30 ENCOUNTER — Other Ambulatory Visit: Payer: Self-pay | Admitting: Cardiology

## 2023-03-30 ENCOUNTER — Ambulatory Visit: Payer: Medicare Other | Admitting: Gastroenterology

## 2023-03-30 ENCOUNTER — Encounter: Payer: Self-pay | Admitting: Gastroenterology

## 2023-03-30 VITALS — BP 139/77 | HR 74 | Temp 98.0°F | Ht 64.5 in | Wt 180.8 lb

## 2023-03-30 DIAGNOSIS — Z86010 Personal history of colonic polyps: Secondary | ICD-10-CM | POA: Diagnosis not present

## 2023-03-30 DIAGNOSIS — I493 Ventricular premature depolarization: Secondary | ICD-10-CM

## 2023-03-30 MED ORDER — ESOMEPRAZOLE MAGNESIUM 20 MG PO CPDR: 20.0000 mg | DELAYED_RELEASE_CAPSULE | Freq: Every day | ORAL | Status: AC

## 2023-03-30 NOTE — Patient Instructions (Addendum)
Colonoscopy to be scheduled for September.  Hoping your dad has a successful surgery!

## 2023-04-06 ENCOUNTER — Other Ambulatory Visit: Payer: Self-pay | Admitting: *Deleted

## 2023-04-06 ENCOUNTER — Encounter: Payer: Self-pay | Admitting: *Deleted

## 2023-04-06 MED ORDER — CLENPIQ 10-3.5-12 MG-GM -GM/175ML PO SOLN: 1.0000 | ORAL | 0 refills | Status: DC

## 2023-04-23 ENCOUNTER — Other Ambulatory Visit: Payer: Self-pay | Admitting: Family Medicine

## 2023-04-23 DIAGNOSIS — H5712 Ocular pain, left eye: Secondary | ICD-10-CM

## 2023-04-24 ENCOUNTER — Ambulatory Visit
Admission: RE | Admit: 2023-04-24 | Discharge: 2023-04-24 | Disposition: A | Discharge status: Home / Self Care | Payer: Medicare Other | Source: Ambulatory Visit | Attending: Family Medicine | Admitting: Family Medicine

## 2023-04-24 DIAGNOSIS — H5712 Ocular pain, left eye: Secondary | ICD-10-CM

## 2023-05-04 ENCOUNTER — Other Ambulatory Visit: Payer: Self-pay | Admitting: *Deleted

## 2023-05-04 ENCOUNTER — Telehealth: Payer: Self-pay | Admitting: *Deleted

## 2023-05-04 MED ORDER — CLENPIQ 10-3.5-12 MG-GM -GM/175ML PO SOLN: 1.0000 | ORAL | 0 refills | Status: DC

## 2023-05-04 NOTE — Telephone Encounter (Signed)
Pt left vm stating that pharmacy had not received her prep.   Advised pt that clenpiq was sent in on 04/06/23 to [facility] on [address]. She says that she has talked to the pharmacy multiple times and they say they don't have it. Advised pt that I would resend the prep in and if the pharmacy has any issues to call us. Pt verbalized understanding.

## 2023-05-06 NOTE — Patient Instructions (Signed)
[name]  05/06/2023     @PREFPERIOPPHARMACY @   Your procedure is scheduled on 05-12-23.  Report to [facility] at 0745 A.M.  Call this number if you have problems the morning of surgery:  [phone]  If you experience any cold or flu symptoms such as cough, fever, chills, shortness of breath, etc. between now and your scheduled surgery, please notify us at the above number.   Remember:  Do not eat or drink after midnight.      Take these medicines the morning of surgery with A SIP OF WATER nexium.  Use albuterol inhaler & breztri inhaler prior to coming to hospital & bring albuterol with you.family    Do not wear jewelry, make-up or nail polish, including gel polish,  artificial nails, or any other type of covering on natural nails (fingers and  toes).  Do not wear lotions, powders, or perfumes, or deodorant.  Do not shave 48 hours prior to surgery.  Men may shave face and neck.  Do not bring valuables to the hospital.  [facility] is not responsible for any belongings or valuables.  Contacts, dentures or bridgework may not be worn into surgery.  Leave your suitcase in the car.  After surgery it may be brought to your room.  For patients admitted to the hospital, discharge time will be determined by your treatment team.  Patients discharged the day of surgery will not be allowed to drive home.   Name and phone number of your driver:   family Special instructions:  FOLLOW PREP & DIET INSTRUCTIONS PROVIDED TO YOU BY THE DOCTOR'S OFFICE  Please read over the following fact sheets that you were given. Anesthesia Post-op Instructions      PATIENT INSTRUCTIONS POST-ANESTHESIA  IMMEDIATELY FOLLOWING SURGERY:  Do not drive or operate machinery for the first twenty four hours after surgery.  Do not make any important decisions for twenty four hours after surgery or while taking narcotic pain medications or sedatives.  If you develop intractable nausea and vomiting or a  severe headache please notify your doctor immediately.  FOLLOW-UP:  Please make an appointment with your surgeon as instructed. You do not need to follow up with anesthesia unless specifically instructed to do so.  WOUND CARE INSTRUCTIONS (if applicable):  Keep a dry clean dressing on the anesthesia/puncture wound site if there is drainage.  Once the wound has quit draining you may leave it open to air.  Generally you should leave the bandage intact for twenty four hours unless there is drainage.  If the epidural site drains for more than 36-48 hours please call the anesthesia department.  QUESTIONS?:  Please feel free to call your physician or the hospital operator if you have any questions, and they will be happy to assist you.      Colonoscopy, Adult A colonoscopy is a procedure to look at the entire large intestine. This procedure is done using a long, thin, flexible tube that has a camera on the end. You may have a colonoscopy: As a part of normal colorectal screening. If you have certain symptoms, such as: A low number of red blood cells in your blood (anemia). Diarrhea that does not go away. Pain in your abdomen. Blood in your stool. A colonoscopy can help screen for and diagnose medical problems, including: An abnormal growth of cells or tissue (tumor). Abnormal growths within the lining of your intestine (polyps). Inflammation. Areas of bleeding. Tell your health care provider about: Any  allergies you have. All medicines you are taking, including vitamins, herbs, eye drops, creams, and over-the-counter medicines. Any problems you or family members have had with anesthetic medicines. Any bleeding problems you have. Any surgeries you have had. Any medical conditions you have. Any problems you have had with having bowel movements. Whether you are pregnant or may be pregnant. What are the risks? Generally, this is a safe procedure. However, problems may occur,  including: Bleeding. Damage to your intestine. Allergic reactions to medicines given during the procedure. Infection. This is rare. What happens before the procedure? Eating and drinking restrictions Follow instructions from your health care provider about eating or drinking restrictions, which may include: A few days before the procedure: Follow a low-fiber diet. Avoid nuts, seeds, dried fruit, raw fruits, and vegetables. 1-3 days before the procedure: Eat only gelatin dessert or ice pops. Drink only clear liquids, such as water, clear juice, clear broth or bouillon, black coffee or tea, or clear soft drinks or sports drinks. Avoid liquids that contain red or purple dye. The day of the procedure: Do not eat solid foods. You may continue to drink clear liquids until up to 2 hours before the procedure. Do not eat or drink anything starting 2 hours before the procedure, or within the time period that your health care provider recommends. Bowel prep If you were prescribed a bowel prep to take by mouth (orally) to clean out your colon: Take it as told by your health care provider. Starting the day before your procedure, you will need to drink a large amount of liquid medicine. The liquid will cause you to have many bowel movements of loose stool until your stool becomes almost clear or light green. If your skin or the opening between the buttocks (anus) gets irritated from diarrhea, you may relieve the irritation using: Wipes with medicine in them, such as adult wet wipes with aloe and vitamin E. A product to soothe skin, such as petroleum jelly. If you vomit while drinking the bowel prep: Take a break for up to 60 minutes. Begin the bowel prep again. Call your health care provider if you keep vomiting or you cannot take the bowel prep without vomiting. To clean out your colon, you may also be given: Laxative medicines. These help you have a bowel movement. Instructions for enema use. An  enema is liquid medicine injected into your rectum. Medicines Ask your health care provider about: Changing or stopping your regular medicines or supplements. This is especially important if you are taking iron supplements, diabetes medicines, or blood thinners. Taking medicines such as aspirin and ibuprofen. These medicines can thin your blood. Do not take these medicines unless your health care provider tells you to take them. Taking over-the-counter medicines, vitamins, herbs, and supplements. General instructions Ask your health care provider what steps will be taken to help prevent infection. These may include washing skin with a germ-killing soap. If you will be going home right after the procedure, plan to have a responsible adult: Take you home from the hospital or clinic. You will not be allowed to drive. Care for you for the time you are told. What happens during the procedure?  An IV will be inserted into one of your veins. You will be given a medicine to make you fall asleep (general anesthetic). You will lie on your side with your knees bent. A lubricant will be put on the tube. Then the tube will be: Inserted into your anus. Gently eased through  all parts of your large intestine. Air will be sent into your colon to keep it open. This may cause some pressure or cramping. Images will be taken with the camera and will appear on a screen. A small tissue sample may be removed to be looked at under a microscope (biopsy). The tissue may be sent to a lab for testing if any signs of problems are found. If small polyps are found, they may be removed and checked for cancer cells. When the procedure is finished, the tube will be removed. The procedure may vary among health care providers and hospitals. What happens after the procedure? Your blood pressure, heart rate, breathing rate, and blood oxygen level will be monitored until you leave the hospital or clinic. You may have a small  amount of blood in your stool. You may pass gas and have mild cramping or bloating in your abdomen. This is caused by the air that was used to open your colon during the exam. If you were given a sedative during the procedure, it can affect you for several hours. Do not drive or operate machinery until your health care provider says that it is safe. It is up to you to get the results of your procedure. Ask your health care provider, or the department that is doing the procedure, when your results will be ready. Summary A colonoscopy is a procedure to look at the entire large intestine. Follow instructions from your health care provider about eating and drinking before the procedure. If you were prescribed an oral bowel prep to clean out your colon, take it as told by your health care provider. During the colonoscopy, a flexible tube with a camera on its end is inserted into the anus and then passed into all parts of the large intestine. This information is not intended to replace advice given to you by your health care provider. Make sure you discuss any questions you have with your health care provider. Document Revised: 09/22/2022 Document Reviewed: 04/02/2021 Elsevier Patient Education  2024 [employer] Colonoscopy, Adult, Care After The following information offers guidance on how to care for yourself after your procedure. Your health care provider may also give you more specific instructions. If you have problems or questions, contact your health care provider. What can I expect after the procedure? After the procedure, it is common to have: A small amount of blood in your stool for 24 hours after the procedure. Some gas. Mild cramping or bloating of your abdomen. Follow these instructions at home: Eating and drinking  Drink enough fluid to keep your urine pale yellow. Follow instructions from your health care provider about eating or drinking restrictions. Resume your normal diet as  told by your health care provider. Avoid heavy or fried foods that are hard to digest. Activity Rest as told by your health care provider. Avoid sitting for a long time without moving. Get up to take short walks every 1-2 hours. This is important to improve blood flow and breathing. Ask for help if you feel weak or unsteady. Return to your normal activities as told by your health care provider. Ask your health care provider what activities are safe for you. Managing cramping and bloating  Try walking around when you have cramps or feel bloated. If directed, apply heat to your abdomen as told by your health care provider. Use the heat source that your health care provider recommends, such as a moist heat pack or a heating pad. Place a towel  between your skin and the heat source. Leave the heat on for 20-30 minutes. Remove the heat if your skin turns bright red. This is especially important if you are unable to feel pain, heat, or cold. You have a greater risk of getting burned. General instructions If you were given a sedative during the procedure, it can affect you for several hours. Do not drive or operate machinery until your health care provider says that it is safe. For the first 24 hours after the procedure: Do not sign important documents. Do not drink alcohol. Do your regular daily activities at a slower pace than normal. Eat soft foods that are easy to digest. Take over-the-counter and prescription medicines only as told by your health care provider. Keep all follow-up visits. This is important. Contact a health care provider if: You have blood in your stool 2-3 days after the procedure. Get help right away if: You have more than a small spotting of blood in your stool. You have large blood clots in your stool. You have swelling of your abdomen. You have nausea or vomiting. You have a fever. You have increasing pain in your abdomen that is not relieved with medicine. These  symptoms may be an emergency. Get help right away. Call 911. Do not wait to see if the symptoms will go away. Do not drive yourself to the hospital. Summary After the procedure, it is common to have a small amount of blood in your stool. You may also have mild cramping and bloating of your abdomen. If you were given a sedative during the procedure, it can affect you for several hours. Do not drive or operate machinery until your health care provider says that it is safe. Get help right away if you have a lot of blood in your stool, nausea or vomiting, a fever, or increased pain in your abdomen. This information is not intended to replace advice given to you by your health care provider. Make sure you discuss any questions you have with your health care provider. Document Revised: 09/22/2022 Document Reviewed: 04/02/2021 Elsevier Patient Education  2024 [employer].

## 2023-05-07 ENCOUNTER — Encounter (HOSPITAL_COMMUNITY): Payer: Self-pay

## 2023-05-07 ENCOUNTER — Encounter (HOSPITAL_COMMUNITY)
Admission: RE | Admit: 2023-05-07 | Discharge: 2023-05-07 | Disposition: A | Discharge status: Home / Self Care | Payer: Medicare Other | Source: Ambulatory Visit | Attending: Internal Medicine | Admitting: Internal Medicine

## 2023-05-07 ENCOUNTER — Other Ambulatory Visit: Payer: Self-pay

## 2023-05-07 HISTORY — DX: Dyspnea, unspecified: R06.00

## 2023-05-07 HISTORY — DX: Nausea with vomiting, unspecified: R11.2

## 2023-05-07 HISTORY — DX: Other specified postprocedural states: Z98.890

## 2023-05-07 HISTORY — DX: Unspecified asthma, uncomplicated: J45.909

## 2023-05-12 ENCOUNTER — Encounter (HOSPITAL_COMMUNITY): Payer: Self-pay | Admitting: Internal Medicine

## 2023-05-12 ENCOUNTER — Ambulatory Visit (HOSPITAL_COMMUNITY): Payer: Medicare Other | Admitting: Anesthesiology

## 2023-05-12 ENCOUNTER — Encounter (HOSPITAL_COMMUNITY)
Admission: RE | Disposition: A | Discharge status: Home / Self Care | Payer: Self-pay | Source: Home / Self Care | Attending: Internal Medicine

## 2023-05-12 ENCOUNTER — Ambulatory Visit (HOSPITAL_COMMUNITY)
Admission: RE | Admit: 2023-05-12 | Discharge: 2023-05-12 | Disposition: A | Discharge status: Home / Self Care | Payer: Medicare Other | Attending: Internal Medicine | Admitting: Internal Medicine

## 2023-05-12 DIAGNOSIS — I10 Essential (primary) hypertension: Secondary | ICD-10-CM | POA: Diagnosis not present

## 2023-05-12 DIAGNOSIS — Z86010 Personal history of colonic polyps: Secondary | ICD-10-CM | POA: Diagnosis not present

## 2023-05-12 DIAGNOSIS — R0602 Shortness of breath: Secondary | ICD-10-CM | POA: Diagnosis not present

## 2023-05-12 DIAGNOSIS — K5730 Diverticulosis of large intestine without perforation or abscess without bleeding: Secondary | ICD-10-CM

## 2023-05-12 DIAGNOSIS — Z1211 Encounter for screening for malignant neoplasm of colon: Secondary | ICD-10-CM | POA: Diagnosis not present

## 2023-05-12 DIAGNOSIS — K219 Gastro-esophageal reflux disease without esophagitis: Secondary | ICD-10-CM | POA: Diagnosis not present

## 2023-05-12 DIAGNOSIS — Z87891 Personal history of nicotine dependence: Secondary | ICD-10-CM | POA: Diagnosis not present

## 2023-05-12 DIAGNOSIS — I2510 Atherosclerotic heart disease of native coronary artery without angina pectoris: Secondary | ICD-10-CM | POA: Insufficient documentation

## 2023-05-12 DIAGNOSIS — J45909 Unspecified asthma, uncomplicated: Secondary | ICD-10-CM | POA: Diagnosis not present

## 2023-05-12 DIAGNOSIS — K635 Polyp of colon: Secondary | ICD-10-CM

## 2023-05-12 DIAGNOSIS — D123 Benign neoplasm of transverse colon: Secondary | ICD-10-CM | POA: Insufficient documentation

## 2023-05-12 HISTORY — PX: COLONOSCOPY WITH PROPOFOL: SHX5780

## 2023-05-12 HISTORY — PX: POLYPECTOMY: SHX149

## 2023-05-12 SURGERY — COLONOSCOPY WITH PROPOFOL
Anesthesia: General

## 2023-05-12 MED ORDER — PROPOFOL 500 MG/50ML IV EMUL
INTRAVENOUS | Status: DC | PRN
  Administered 2023-05-12: 150 ug/kg/min via INTRAVENOUS

## 2023-05-12 MED ORDER — PROPOFOL 10 MG/ML IV BOLUS
INTRAVENOUS | Status: DC | PRN
  Administered 2023-05-12 (×2): 50 mg via INTRAVENOUS

## 2023-05-12 MED ORDER — LACTATED RINGERS IV SOLN: INTRAVENOUS | Status: DC

## 2023-05-12 NOTE — Anesthesia Preprocedure Evaluation (Signed)
Anesthesia Evaluation  Patient identified by MRN, date of birth, ID band Patient awake    Reviewed: Allergy & Precautions, H&P , NPO status , Patient's Chart, lab work & pertinent test results, reviewed documented beta blocker date and time   History of Anesthesia Complications (+) PONV and history of anesthetic complications  Airway Mallampati: II  TM Distance: >3 FB Neck ROM: full    Dental no notable dental hx.    Pulmonary neg pulmonary ROS, shortness of breath, asthma , former smoker   Pulmonary exam normal breath sounds clear to auscultation       Cardiovascular Exercise Tolerance: Good hypertension, + CAD  negative cardio ROS  Rhythm:regular Rate:Normal     Neuro/Psych negative neurological ROS  negative psych ROS   GI/Hepatic negative GI ROS, Neg liver ROS,GERD  ,,  Endo/Other  negative endocrine ROSdiabetes    Renal/GU negative Renal ROS  negative genitourinary   Musculoskeletal   Abdominal   Peds  Hematology negative hematology ROS (+)   Anesthesia Other Findings   Reproductive/Obstetrics negative OB ROS                             Anesthesia Physical Anesthesia Plan  ASA: 2  Anesthesia Plan: General   Post-op Pain Management:    Induction:   PONV Risk Score and Plan: Propofol infusion  Airway Management Planned:   Additional Equipment:   Intra-op Plan:   Post-operative Plan:   Informed Consent: I have reviewed the patients History and Physical, chart, labs and discussed the procedure including the risks, benefits and alternatives for the proposed anesthesia with the patient or authorized representative who has indicated his/her understanding and acceptance.     Dental Advisory Given  Plan Discussed with: CRNA  Anesthesia Plan Comments:        Anesthesia Quick Evaluation

## 2023-05-12 NOTE — Op Note (Signed)
[facility] Patient Name: [name] Procedure Date: 05/12/2023 10:59 AM MRN: [id number] Date of Birth: 05-04-1956 Attending MD: Gennette Pac , MD, [id number] CSN: [id number] Age: 67 Admit Type: Outpatient Procedure:                Colonoscopy Indications:              High risk colon cancer surveillance: Personal                            history of colonic polyps Providers:                Gennette Pac, MD, Crystal Page, Zena Amos Referring MD:              Medicines:                Propofol per Anesthesia Complications:            No immediate complications. Estimated Blood Loss:     Estimated blood loss was minimal. Procedure:                Pre-Anesthesia Assessment:                           - Prior to the procedure, a History and Physical                            was performed, and patient medications and                            allergies were reviewed. The patient's tolerance of                            previous anesthesia was also reviewed. The risks                            and benefits of the procedure and the sedation                            options and risks were discussed with the patient.                            All questions were answered, and informed consent                            was obtained. Prior Anticoagulants: The patient has                            taken no anticoagulant or antiplatelet agents. ASA                            Grade Assessment: III - A patient with severe  systemic disease. After reviewing the risks and                            benefits, the patient was deemed in satisfactory                            condition to undergo the procedure.                           After obtaining informed consent, the colonoscope                            was passed under direct vision. Throughout the                            procedure, the patient's  blood pressure, pulse, and                            oxygen saturations were monitored continuously. The                            [id number]) scope was introduced through the                            anus and advanced to the the cecum, identified by                            appendiceal orifice and ileocecal valve. The                            colonoscopy was performed without difficulty. The                            patient tolerated the procedure well. The quality                            of the bowel preparation was adequate. The entire                            colon was well visualized. The patient tolerated                            the procedure well. Scope In: 11:25:21 AM Scope Out: 11:39:13 AM Scope Withdrawal Time: 0 hours 8 minutes 18 seconds  Total Procedure Duration: 0 hours 13 minutes 52 seconds  Findings:      Scattered small-mouthed diverticula were found in the sigmoid colon and       descending colon.      A 3 mm polyp was found in the hepatic flexure. The polyp was sessile.       The polyp was removed with a cold snare. Resection and retrieval were       complete. Estimated blood loss was minimal.      The exam was otherwise without abnormality on direct and retroflexion       views. Impression:               -  Diverticulosis in the sigmoid colon and in the                            descending colon.                           - One 3 mm polyp at the hepatic flexure, removed                            with a cold snare. Resected and retrieved.                           - The examination was otherwise normal on direct                            and retroflexion views. Moderate Sedation:      Moderate (conscious) sedation was personally administered by an       anesthesia professional. The following parameters were monitored: oxygen       saturation, heart rate, blood pressure, respiratory rate, EKG, adequacy       of pulmonary ventilation, and  response to care. Recommendation:           - Patient has a contact number available for                            emergencies. The signs and symptoms of potential                            delayed complications were discussed with the                            patient. Return to normal activities tomorrow.                            Written discharge instructions were provided to the                            patient.                           - Resume previous diet.                           - Continue present medications.                           - Repeat colonoscopy date to be determined after                            pending pathology results are reviewed for                            surveillance.                           - Return to GI office (date not yet determined).  Procedure Code(s):        --- Professional ---                           [id number], Colonoscopy, flexible; with removal of                            tumor(s), polyp(s), or other lesion(s) by snare                            technique Diagnosis Code(s):        --- Professional ---                           Z86.010, Personal history of colonic polyps                           D12.3, Benign neoplasm of transverse colon (hepatic                            flexure or splenic flexure)                           K57.30, Diverticulosis of large intestine without                            perforation or abscess without bleeding CPT copyright 2022 American [employer] Association. All rights reserved. The codes documented in this report are preliminary and upon coder review may  be revised to meet current compliance requirements. [name]. [name], MD Gennette Pac, MD 05/12/2023 11:47:11 AM This report has been signed electronically. Number of Addenda: 0

## 2023-05-12 NOTE — H&P (Signed)
@LOGO @   Primary Care Physician:  Tisovec, Adelfa Koh, MD Primary Gastroenterologist:  DrRourk   Pre-Procedure History & Physical: HPI:  [name] is a 67 y.o. female here for  surveillance colonoscopy.  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 Franky Macho: four polyps in rectum and sigmoid colon (hyperplastic polyps)   COLONOSCOPY N/A 02/07/2020   Procedure: COLONOSCOPY;  Surgeon: Corbin Ade, MD;  Location: [facility];  Service: Endoscopy;  Laterality: N/A;  7:30   ESOPHAGOGASTRODUODENOSCOPY N/A 06/08/2014   SLF: 1. No obvious source for dyspepsia identified. 2. Medium sized hiatal hernia 3. Mild non-erosive gastritis.   ESOPHAGOGASTRODUODENOSCOPY  2010   Dr. Franky Macho: hiatal hernia   EUS  05/21/10   outlaw:hyperechoic liver mild consistent with steatosis otherwise normal US   GALLBLADDER SURGERY  2004   HEMOSTASIS CLIP PLACEMENT  02/07/2020   Procedure: HEMOSTASIS CLIP PLACEMENT;  Surgeon: Corbin Ade, MD;  [facility]: [facility];  Service: Endoscopy;;   LAPAROSCOPIC NISSEN FUNDOPLICATION N/A 08/03/2014   Procedure: LAPAROSCOPIC NISSEN FUNDOPLICATION;  Surgeon: Dalia Heading, MD;  Location: [facility];  Service: General;  Laterality: N/A;   POLYPECTOMY  02/07/2020   Procedure: POLYPECTOMY;  Surgeon: Corbin Ade, MD;  [facility];  Service: Endoscopy;;   TONSILLECTOMY     VIDEO BRONCHOSCOPY Bilateral 01/23/2014   Procedure: VIDEO BRONCHOSCOPY  WITHOUT FLUORO;  Surgeon: Leslye Peer, MD;  Location: [facility];  Service: Cardiopulmonary;  Laterality: Bilateral;    Prior to Admission medications   Medication Sig Start Date End Date Taking? Authorizing Provider  albuterol (VENTOLIN HFA) 108 (90 Base) MCG/ACT inhaler TAKE 2 PUFFS BY MOUTH EVERY 6 HOURS AS NEEDED FOR WHEEZE OR SHORTNESS OF BREATH 08/29/21  Yes Leslye Peer, MD  aspirin EC 81 MG tablet Take 81 mg by mouth daily. Swallow whole.   Yes [provider]  Budeson-Glycopyrrol-Formoterol (BREZTRI AEROSPHERE) 160-9-4.8 MCG/ACT AERO Inhale 2 puffs into the lungs in the morning and at bedtime. 11/18/21  Yes Leslye Peer, MD  cetirizine (ZYRTEC) 10 MG chewable tablet Chew 10 mg by mouth daily.   Yes [provider]  docusate sodium (COLACE) 100 MG capsule Take 100 mg by mouth every other day.   Yes [provider]  esomeprazole (NEXIUM) 20 MG capsule Take 1 capsule (20 mg total) by mouth daily before breakfast. 03/30/23  Yes Tiffany Kocher, PA-C  irbesartan (AVAPRO) 150 MG tablet TAKE 1 TABLET BY MOUTH EVERY DAY 09/22/22  Yes Tolia, Sunit, DO  meloxicam (MOBIC) 15 MG tablet TAKE 1 TABLET (15 MG TOTAL) BY MOUTH DAILY. 10/16/22  Yes Hyatt, Max T, DPM  rosuvastatin (CRESTOR) 20 MG tablet Take 20 mg by mouth daily.   Yes [provider]  Verapamil HCl CR 300 MG CP24 TAKE 1 CAPSULE BY MOUTH EVERYDAY AT BEDTIME 03/31/23  Yes Tolia, Sunit, DO  Biotin 5000 MCG CAPS Take 5,000 mcg by mouth daily.    [provider]  Cholecalciferol (VITAMIN D3) 1.25 MG (50000 UT) CAPS Take by mouth.    [provider]  Melatonin 5 MG CAPS Take 5 mg by mouth at bedtime.    [provider]  metroNIDAZOLE (METROCREAM) 0.75 % cream Apply 1 Application topically 2 (two) times daily. 01/29/23   [provider]  Sod Picosulfate-Mag Ox-Cit Acd (CLENPIQ) 10-3.5-12 MG-GM -GM/175ML SOLN Take 1 kit by mouth as directed. 05/04/23   Charmel Pronovost, [name], MD   trospium (SANCTURA) 20 MG tablet Take 20 mg by mouth at bedtime.    [provider]    Allergies as of 04/06/2023 - Review Complete 03/30/2023  Allergen Reaction Noted   Dexlansoprazole Diarrhea, Other (See Comments), and Nausea And Vomiting 04/13/2014   Oxycodone-acetaminophen Other (See Comments) 02/11/2015   Breztri aerosphere [budeson-glycopyrrol-formoterol]  07/04/2021   Percocet [oxycodone-acetaminophen]     Latex Rash 04/30/2010    Family History  Problem Relation Age of Onset   Heart failure Mother    Cancer Mother        lymphoma   Allergies Mother    Heart disease Father    Allergies Brother    Heart disease Maternal Grandfather    Heart disease Paternal Grandmother    Colon cancer Neg Hx     Social History   Socioeconomic History   Marital status: Married    Spouse name: Not on file   Number of children: 1   Years of education: Not on file   Highest education level: Not on file  Occupational History   Not on file  Tobacco Use   Smoking status: Former    Current packs/day: 0.00    Average packs/day: 1.5 packs/day for 30.0 years (45.0 ttl pk-yrs)    Types: Cigarettes    Start date: 05/24/1978    Quit date: 05/24/2008    Years since quitting: 14.9   Smokeless tobacco: Never   Tobacco comments:    quit x 6 years  Vaping Use   Vaping status: Never Used  Substance and Sexual Activity   Alcohol use: No   Drug use: No   Sexual activity: Not on file  Other Topics Concern   Not on file  Social History Narrative   Not on file   Social Determinants of Health   Financial Resource Strain: Not on file  Food Insecurity: Not on file  Transportation Needs: Not on file  Physical Activity: Not on file  Stress: Not on file  Social Connections: Not on file  Intimate Partner Violence: Not on file    Review of Systems: See HPI, otherwise negative ROS  Physical Exam: BP (!) 105/90   Pulse 85   Temp 97.7 F (36.5 C) (Oral)   Resp 19   Ht 5' 4.5"  (1.638 m)   Wt 79.4 kg   SpO2 95%   BMI 29.57 kg/m  General:   Alert,  Well-developed, well-nourished, pleasant and cooperative in NAD Neck:  Supple; no masses or thyromegaly. No significant cervical adenopathy. Lungs:  Clear throughout to auscultation.   No wheezes, crackles, or rhonchi. No acute distress. Heart:  Regular rate and rhythm; no murmurs, clicks, rubs,  or gallops. Abdomen: Non-distended, normal bowel sounds.  Soft and nontender without appreciable mass or hepatosplenomegaly.   Impression/Plan:   67 year old lady with advanced adenomas removed 2021; here for surveillance examination.  The risks, benefits, limitations, alternatives and imponderables have been  reviewed with the patient. Questions have been answered. All parties are agreeable.     Notice: This dictation was prepared with Dragon dictation along with smaller phrase technology. Any transcriptional errors that result from this process are unintentional and may not be corrected upon review.

## 2023-05-12 NOTE — Discharge Instructions (Addendum)
  Colonoscopy Discharge Instructions  Read the instructions outlined below and refer to this sheet in the next few weeks. These discharge instructions provide you with general information on caring for yourself after you leave the hospital. Your doctor may also give you specific instructions. While your treatment has been planned according to the most current medical practices available, unavoidable complications occasionally occur. If you have any problems or questions after discharge, call Dr. Jena Gauss at [phone]. ACTIVITY You may resume your regular activity, but move at a slower pace for the next 24 hours.  Take frequent rest periods for the next 24 hours.  Walking will help get rid of the air and reduce the bloated feeling in your belly (abdomen).  No driving for 24 hours (because of the medicine (anesthesia) used during the test).   Do not sign any important legal documents or operate any machinery for 24 hours (because of the anesthesia used during the test).  NUTRITION Drink plenty of fluids.  You may resume your normal diet as instructed by your doctor.  Begin with a light meal and progress to your normal diet. Heavy or fried foods are harder to digest and may make you feel sick to your stomach (nauseated).  Avoid alcoholic beverages for 24 hours or as instructed.  MEDICATIONS You may resume your normal medications unless your doctor tells you otherwise.  WHAT YOU CAN EXPECT TODAY Some feelings of bloating in the abdomen.  Passage of more gas than usual.  Spotting of blood in your stool or on the toilet paper.  IF YOU HAD POLYPS REMOVED DURING THE COLONOSCOPY: No aspirin products for 7 days or as instructed.  No alcohol for 7 days or as instructed.  Eat a soft diet for the next 24 hours.  FINDING OUT THE RESULTS OF YOUR TEST Not all test results are available during your visit. If your test results are not back during the visit, make an appointment with your caregiver to find out the  results. Do not assume everything is normal if you have not heard from your caregiver or the medical facility. It is important for you to follow up on all of your test results.  SEEK IMMEDIATE MEDICAL ATTENTION IF: You have more than a spotting of blood in your stool.  Your belly is swollen (abdominal distention).  You are nauseated or vomiting.  You have a temperature over 101.  You have abdominal pain or discomfort that is severe or gets worse throughout the day.      1 small polyp found and removed  Diverticulosis and colon polyp information provided   further recommendations to follow pending review of pathology report   at patient request, I called [name]  at [phone] -  reviewed findings and recommendations

## 2023-05-12 NOTE — Transfer of Care (Signed)
Immediate Anesthesia Transfer of Care Note  Patient: [name]  Procedure(s) Performed: COLONOSCOPY WITH PROPOFOL POLYPECTOMY INTESTINAL  Patient Location: Short Stay  Anesthesia Type:General  Level of Consciousness: awake, alert , oriented, and patient cooperative  Airway & Oxygen Therapy: Patient Spontanous Breathing  Post-op Assessment: Report given to RN, Post -op Vital signs reviewed and stable, and Patient moving all extremities X 4  Post vital signs: Reviewed and stable  Last Vitals:  Vitals Value Taken Time  BP 95/42 05/12/23 1143  Temp    Pulse 70 05/12/23 1144  Resp 15 05/12/23 1144  SpO2 95 % 05/12/23 1144  Vitals shown include unfiled device data.  Last Pain:  Vitals:   05/12/23 1120  TempSrc:   PainSc: 3          Complications: No notable events documented.

## 2023-05-14 LAB — SURGICAL PATHOLOGY

## 2023-05-15 ENCOUNTER — Encounter: Payer: Self-pay | Admitting: Internal Medicine

## 2023-05-20 ENCOUNTER — Encounter (HOSPITAL_COMMUNITY): Payer: Self-pay | Admitting: Internal Medicine

## 2023-05-20 NOTE — Anesthesia Postprocedure Evaluation (Signed)
Anesthesia Post Note  Patient: [name]  Procedure(s) Performed: COLONOSCOPY WITH PROPOFOL POLYPECTOMY INTESTINAL  Patient location during evaluation: Phase II Anesthesia Type: General Level of consciousness: awake Pain management: pain level controlled Vital Signs Assessment: post-procedure vital signs reviewed and stable Respiratory status: spontaneous breathing and respiratory function stable Cardiovascular status: blood pressure returned to baseline and stable Postop Assessment: no headache and no apparent nausea or vomiting Anesthetic complications: no Comments: Late entry   No notable events documented.   Last Vitals:  Vitals:   05/12/23 0915 05/12/23 1143  BP: (!) 105/90 (!) 95/42  Pulse:  70  Resp:  18  Temp:  36.5 C  SpO2:  97%    Last Pain:  Vitals:   05/13/23 1046  TempSrc:   PainSc: 0-No pain                 Windell Norfolk

## 2023-05-23 ENCOUNTER — Other Ambulatory Visit: Payer: Self-pay | Admitting: Cardiology

## 2023-05-23 DIAGNOSIS — I493 Ventricular premature depolarization: Secondary | ICD-10-CM

## 2023-09-25 ENCOUNTER — Other Ambulatory Visit: Payer: Self-pay | Admitting: Cardiology

## 2023-09-25 DIAGNOSIS — I10 Essential (primary) hypertension: Secondary | ICD-10-CM

## 2023-10-15 DIAGNOSIS — M25571 Pain in right ankle and joints of right foot: Secondary | ICD-10-CM | POA: Insufficient documentation

## 2023-10-28 ENCOUNTER — Encounter: Payer: Self-pay | Admitting: Emergency Medicine

## 2023-12-22 ENCOUNTER — Other Ambulatory Visit (HOSPITAL_COMMUNITY): Payer: Self-pay | Admitting: Internal Medicine

## 2023-12-22 DIAGNOSIS — Z1231 Encounter for screening mammogram for malignant neoplasm of breast: Secondary | ICD-10-CM

## 2024-01-03 ENCOUNTER — Ambulatory Visit (HOSPITAL_COMMUNITY)

## 2024-01-05 ENCOUNTER — Ambulatory Visit (HOSPITAL_COMMUNITY)
Admission: RE | Admit: 2024-01-05 | Discharge: 2024-01-05 | Disposition: A | Discharge status: Home / Self Care | Source: Ambulatory Visit | Attending: Internal Medicine | Admitting: Internal Medicine

## 2024-01-05 DIAGNOSIS — Z1231 Encounter for screening mammogram for malignant neoplasm of breast: Secondary | ICD-10-CM | POA: Diagnosis present

## 2024-02-02 ENCOUNTER — Encounter: Payer: Self-pay | Admitting: Emergency Medicine

## 2024-02-02 ENCOUNTER — Ambulatory Visit: Admitting: Emergency Medicine

## 2024-02-02 VITALS — BP 134/76 | HR 75 | Temp 98.0°F | Ht 64.5 in | Wt 177.6 lb

## 2024-02-02 DIAGNOSIS — J479 Bronchiectasis, uncomplicated: Secondary | ICD-10-CM

## 2024-02-02 DIAGNOSIS — K219 Gastro-esophageal reflux disease without esophagitis: Secondary | ICD-10-CM | POA: Diagnosis not present

## 2024-02-02 DIAGNOSIS — R058 Other specified cough: Secondary | ICD-10-CM

## 2024-02-02 DIAGNOSIS — J4520 Mild intermittent asthma, uncomplicated: Secondary | ICD-10-CM | POA: Diagnosis not present

## 2024-02-02 NOTE — Assessment & Plan Note (Signed)
 Documented on her CT chest.  No cough, no symptoms currently.  Autoimmune panel negative, cultures negative.  We talked about timing of a repeat CT scan of the chest.  For now we have decided to defer since she is doing well clinically.  We can revisit a repeat CT when we see each other next year.  Her last scan was in 2022.

## 2024-02-02 NOTE — Assessment & Plan Note (Signed)
 Continue Nexium  twice a day.  Good control.

## 2024-02-02 NOTE — Assessment & Plan Note (Signed)
 Overall doing well.  She had COVID-19 in the last month, was treated with Paxlovid, did not require prednisone  or antibiotics.  She very rarely uses albuterol .  We had her on Breztri  in the past.  She is done trials of this for stretches, has not benefited.  She is off of it right now and I do not think we need to restart maintenance therapy.  Agree with stopping Breztri  at this time Keep your albuterol  available to use 2 puffs when needed for shortness of breath, chest tightness, wheezing.  You could also consider using it either before or during exercise.  We will refill this for you today. Keep your flu shot and your COVID-19 vaccine up-to-date We will hold off on repeating a CT scan of the chest for now.  We will determine the timing of a repeat scan depending on how your symptoms are doing.  We can discuss this at your next visit. Continue your Nexium  twice a day. Follow Dr. Baldwin Levee in 1 year, sooner if you have any problems.

## 2024-02-02 NOTE — Assessment & Plan Note (Signed)
 Under good control at this time.  Treating her GERD has been very beneficial here.

## 2024-02-02 NOTE — Patient Instructions (Addendum)
 Agree with stopping Breztri  at this time Keep your albuterol  available to use 2 puffs when needed for shortness of breath, chest tightness, wheezing.  You could also consider using it either before or during exercise.  We will refill this for you today. Keep your flu shot and your COVID-19 vaccine up-to-date We will hold off on repeating a CT scan of the chest for now.  We will determine the timing of a repeat scan depending on how your symptoms are doing.  We can discuss this at your next visit. Continue your Nexium  twice a day. Follow Dr. Baldwin Levee in 1 year, sooner if you have any problems.

## 2024-02-02 NOTE — Progress Notes (Signed)
 Subjective:   Patient ID: [name], female    DOB: 01-Sep-1955    MRN: [id number]  Brief patient profile:   ROV 10/28/21 --68 year old woman whom I have followed for bronchiectasis and intermittent asthma.  She also has a hiatal hernia with GERD.  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 I tried starting her on Breztri  in November 2022, had to stopped due to headaches > turned out to be developing shingles. She has exertional SOB, she takes a walk every day. Hears some wheeze. Has some SOB and hears some wheeze when working in her flower beds She is on Nexium  twice daily. She has very little cough.   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  She has associated intermittent asthma.  Also with GERD with a hiatal hernia that impacts her asthma activity.  I last saw her March 2023.  I had her on Breztri  in the past, Nexium  twice daily for her GERD. Today she reports that overall she has been doing fairly well. Her breathing has been overall stable. Rarely needs albuterol . She has taken breztri  for a few stretches, but hasn't felt any benefit so she is now off of it. Remains on the nexium  bid. She walks 3x a week. Minimal cough.    Past Medical History:  Diagnosis Date   Allergy    Asthma    Coronary atherosclerosis due to calcified coronary lesion    Dyspnea    GERD (gastroesophageal reflux disease)    Heartburn    High blood pressure    History of COVID-19    Overactive bladder    PONV (postoperative nausea and vomiting)    PVC (premature ventricular contraction)     Objective:   Physical Exam Wt Readings from Last 3 Encounters:  02/02/24 177 lb 9.6 oz (80.6 kg)  05/12/23 175 lb (79.4 kg)  03/30/23 180 lb 12.8 oz (82 kg)   Vitals:   02/02/24 0943  BP: 134/76  Pulse:  75  Temp: 98 F (36.7 C)  TempSrc: Temporal  SpO2: 97%  Weight: 177 lb 9.6 oz (80.6 kg)  Height: 5' 4.5 (1.638 m)     Gen: Pleasant, well-nourished, in no distress,  normal affect  ENT: No lesions,  mouth clear,  oropharynx clear, no postnasal drip  Neck: No JVD, no stridor  Lungs: No use of accessory muscles, clear without rales or rhonchi  Cardiovascular: RRR, heart sounds normal, no murmur or gallops, no peripheral edema  Musculoskeletal: No deformities, no cyanosis or clubbing  Neuro: alert, non focal  Skin: Warm, no lesions or rashes  Exposures > has lived in [place] and [place], she has owned dogs, cats, never birds, no mold exposures Has worked in a Publishing rights manager, worked at [employer] with some fumes, now has an Paramedic job. No history of auto-immune disease. Distant relative had SLE.  She was dx with PNA and bronchitis frequently as a youngster.       Assessment & Plan:   Mild intermittent asthma Overall doing well.  She had COVID-19 in the last month, was treated with Paxlovid, did not require prednisone  or antibiotics.  She very rarely uses albuterol .  We had her on Breztri  in the past.  She is done trials of this for stretches, has not benefited.  She is off of it right now and I do not think  we need to restart maintenance therapy.  Agree with stopping Breztri  at this time Keep your albuterol  available to use 2 puffs when needed for shortness of breath, chest tightness, wheezing.  You could also consider using it either before or during exercise.  We will refill this for you today. Keep your flu shot and your COVID-19 vaccine up-to-date We will hold off on repeating a CT scan of the chest for now.  We will determine the timing of a repeat scan depending on how your symptoms are doing.  We can discuss this at your next visit. Continue your Nexium  twice a day. Follow Dr. Baldwin Levee in 1 year, sooner if you have any problems.  Upper airway cough syndrome Under good control  at this time.  Treating her GERD has been very beneficial here.  Acid reflux disease Continue Nexium  twice a day.  Good control.  Bronchiectasis without complication (HCC) Documented on her CT chest.  No cough, no symptoms currently.  Autoimmune panel negative, cultures negative.  We talked about timing of a repeat CT scan of the chest.  For now we have decided to defer since she is doing well clinically.  We can revisit a repeat CT when we see each other next year.  Her last scan was in 2022.     Racheal Buddle, MD, PhD 02/02/2024, 10:02 AM [facility] [phone] or if no answer [phone]

## 2024-03-09 ENCOUNTER — Encounter: Payer: Self-pay | Admitting: Cardiology

## 2024-03-09 ENCOUNTER — Ambulatory Visit: Attending: Cardiology | Admitting: Cardiology

## 2024-03-09 ENCOUNTER — Ambulatory Visit

## 2024-03-09 VITALS — BP 134/68 | HR 70 | Resp 16 | Ht 64.0 in | Wt 179.0 lb

## 2024-03-09 DIAGNOSIS — E7800 Pure hypercholesterolemia, unspecified: Secondary | ICD-10-CM

## 2024-03-09 DIAGNOSIS — I10 Essential (primary) hypertension: Secondary | ICD-10-CM

## 2024-03-09 DIAGNOSIS — I2510 Atherosclerotic heart disease of native coronary artery without angina pectoris: Secondary | ICD-10-CM

## 2024-03-09 DIAGNOSIS — I4510 Unspecified right bundle-branch block: Secondary | ICD-10-CM

## 2024-03-09 DIAGNOSIS — Z789 Other specified health status: Secondary | ICD-10-CM

## 2024-03-09 DIAGNOSIS — I493 Ventricular premature depolarization: Secondary | ICD-10-CM

## 2024-03-09 DIAGNOSIS — I2584 Coronary atherosclerosis due to calcified coronary lesion: Secondary | ICD-10-CM

## 2024-03-09 DIAGNOSIS — Z87891 Personal history of nicotine dependence: Secondary | ICD-10-CM

## 2024-03-09 MED ORDER — EZETIMIBE 10 MG PO TABS: 10.0000 mg | ORAL_TABLET | Freq: Every day | ORAL | 3 refills | Status: AC

## 2024-03-09 NOTE — Patient Instructions (Addendum)
 Medication Instructions:  Your physician has recommended you make the following change in your medication:  1-START Zetia  10 mg by mouth daily.  *If you need a refill on your cardiac medications before your next appointment, please call your pharmacy*  Lab Work: Your physician recommends that you return for lab work in: 6 weeks for fasting lipid panel, CMET, and Total CK If you have labs (blood work) drawn today and your tests are completely normal, you will receive your results only by: MyChart Message (if you have MyChart) OR A paper copy in the mail If you have any lab test that is abnormal or we need to change your treatment, we will call you to review the results.  Testing/Procedures: Your physician has recommended that you wear an 3 day monitor in 1 year. Monitors are medical devices that record the heart's electrical activity. Doctors most often us  these monitors to diagnose arrhythmias. Arrhythmias are problems with the speed or rhythm of the heartbeat. The monitor is a small, portable device. You can wear one while you do your normal daily activities. This is usually used to diagnose what is causing palpitations/syncope (passing out).  Follow-Up: At [facility], you and your health needs are our priority.  As part of our continuing mission to provide you with exceptional heart care, our providers are all part of one team.  This team includes your primary Cardiologist (physician) and Advanced Practice Providers or APPs (Physician Assistants and Nurse Practitioners) who all work together to provide you with the care you need, when you need it.  Your next appointment:   1 year(s)  Provider:   Madonna Large, DO    We recommend signing up for the patient portal called MyChart.  Sign up information is provided on this After Visit Summary.  MyChart is used to connect with patients for Virtual Visits (Telemedicine).  Patients are able to view lab/test results, encounter notes,  upcoming appointments, etc.  Non-urgent messages can be sent to your provider as well.   To learn more about what you can do with MyChart, go to [email or web address].   Other Instructions ZIO XT- Long Term Monitor Instructions  Your physician has requested you wear a ZIO patch monitor for 14 days.  This is a single patch monitor. [employer] supplies one patch monitor per enrollment. Additional stickers are not available. Please do not apply patch if you will be having a Nuclear Stress Test,  Echocardiogram, Cardiac CT, MRI, or Chest Xray during the period you would be wearing the  monitor. The patch cannot be worn during these tests. You cannot remove and re-apply the  ZIO XT patch monitor.  Your ZIO patch monitor will be mailed 3 day USPS to your address on file. It may take 3-5 days  to receive your monitor after you have been enrolled.  Once you have received your monitor, please review the enclosed instructions. Your monitor  has already been registered assigning a specific monitor serial # to you.  Billing and Patient Assistance Program Information  We have supplied [employer] with any of your insurance information on file for billing purposes. [employer] offers a sliding scale Patient Assistance Program for patients that do not have  insurance, or whose insurance does not completely cover the cost of the ZIO monitor.  You must apply for the Patient Assistance Program to qualify for this discounted rate.  To apply, please call [employer] at [phone], select option 4, select option 2, ask to apply for  Patient Assistance  Program. Meredeth will ask your household income, and how many people  are in your household. They will quote your out-of-pocket cost based on that information.  [employer] will also be able to set up a 24-month, interest-free payment plan if needed.  Applying the monitor   Shave hair from upper left chest.  Hold abrader disc by orange tab. Rub abrader in 40 strokes over  the upper left chest as  indicated in your monitor instructions.  Clean area with 4 enclosed alcohol  pads. Let dry.  Apply patch as indicated in monitor instructions. Patch will be placed under collarbone on left  side of chest with arrow pointing upward.  Rub patch adhesive wings for 2 minutes. Remove white label marked 1. Remove the white  label marked 2. Rub patch adhesive wings for 2 additional minutes.  While looking in a mirror, press and release button in center of patch. A small green light will  flash 3-4 times. This will be your only indicator that the monitor has been turned on.  Do not shower for the first 24 hours. You may shower after the first 24 hours.  Press the button if you feel a symptom. You will hear a small click. Record Date, Time and  Symptom in the Patient Logbook.  When you are ready to remove the patch, follow instructions on the last 2 pages of Patient  Logbook. Stick patch monitor onto the last page of Patient Logbook.  Place Patient Logbook in the blue and white box. Use locking tab on box and tape box closed  securely. The blue and white box has prepaid postage on it. Please place it in the mailbox as  soon as possible. Your physician should have your test results approximately 7 days after the  monitor has been mailed back to [facility].  Call [facility] Customer Care at [phone] if you have questions regarding  your ZIO XT patch monitor. Call them immediately if you see an orange light blinking on your  monitor.  If your monitor falls off in less than 4 days, contact our Monitor department at [phone].  If your monitor becomes loose or falls off after 4 days call [employer] at [phone] for  suggestions on securing your monitor

## 2024-03-09 NOTE — Progress Notes (Unsigned)
 Enrolled for [employer] to mail a ZIO XT long term holter monitor to the patients address on file.

## 2024-03-09 NOTE — Progress Notes (Signed)
 Cardiology Office Note:  .   Date:  03/09/2024  ID:  [name], DOB [birth year], MRN [id number] PCP:  Vernadine Charlie ORN, MD  Former Cardiology Providers: Emmalene Lawrence, APRN, FNP-C  [facility] Providers Cardiologist:  Madonna Large, DO , [facility] (established care August 30, 2020) Electrophysiologist:  None  Click to update primary MD,subspecialty MD or APP then REFRESH:1}    Chief Complaint  Patient presents with   premature ventricular contraction   Follow-up    History of Present Illness: .   [name] is a 68 y.o. Caucasian female whose past medical history and cardiovascular risk factors includes: History of tachycardia, Hx of COVID x2, hypertension, hyperlipidemia, coronary artery atherosclerosis due to calcified plaque, aortic atherosclerosis, premature ventricular contractions, mitral regurgitation, postmenopausal female, advanced age.  Patient was referred to the practice for management of tachycardia/PVCs as well as coronary artery calcification. She was noted to have a PVC burden of approximately 5% in the past which were multifocal PVCs and she was symptomatic.  She was started on calcium  channel blockers and the dose was uptitrated until she became asymptomatic.  Follow-up cardiac monitor noted improvement in overall PVC burden.  She was last seen in the office in July 2024 and presents today for 1 year follow-up visit.  Since last office visit patient denies any anginal chest pain or heart failure symptoms.  No hospitalizations or urgent care visits for cardiovascular reasons.  She has been compliant with her medical therapy.  Stable weight. Physical endurance remains stable (walks 40-7minutes 3-4x / week or gardening) .    Review of Systems: .   Review of Systems  Cardiovascular:  Negative for chest pain, claudication, irregular heartbeat, leg swelling, near-syncope, orthopnea, palpitations, paroxysmal nocturnal dyspnea and syncope.  Respiratory:  Negative  for shortness of breath.   Hematologic/Lymphatic: Negative for bleeding problem.    Studies Reviewed:   EKG: EKG Interpretation Date/Time:  Thursday March 09 2024 08:09:57 EDT Ventricular Rate:  71 PR Interval:  170 QRS Duration:  126 QT Interval:  396 QTC Calculation: 430 R Axis:   11  Text Interpretation: Normal sinus rhythm Right bundle branch block Possible Inferior infarct (cited on or before 05-Oct-2007) When compared with ECG of 03-Aug-2014 07:15, Questionable change in initial forces of Inferior leads Confirmed by Large Madonna [phone]) on 03/09/2024 8:14:04 AM  Echocardiogram: 08/06/2021: Normal LV systolic function with visual EF 60-65%. Left ventricle cavity is normal in size. Normal left ventricular wall thickness. Normal global wall motion. Normal diastolic filling pattern, normal LAP.  Trace tricuspid regurgitation. No evidence of pulmonary hypertension. Insignificant pericardial effusion. There is no hemodynamic significance. Compared to study 07/22/2016 LVEF remains stable, MR and TR have improved, otherwise no significant change.    Stress Testing:  Nuclear stress test  [08/06/2016]: Myocardial perfusion imaging is normal.  See report for additional details   Cardiac monitor: 3 day extended Holter monitor: March 2023 Dominant rhythm normal sinus., followed by sinus tachycardia (10%). Heart rate 55-133 bpm.  Avg HR 83 bpm. No atrial fibrillation, supraventricular tachycardia, ventricular tachycardia, high grade AV block, pauses (3 seconds or longer). Total ventricular ectopic burden 5% (predominantly isolated PVCs but also present in ventricular bigeminy/trigeminal pattern). Total supraventricular ectopic burden <1%. Patient triggered events: 0.     Cardiac monitor (Zio Patch): October 19, 2022 - October 27, 2022 Dominant rhythm sinus, followed by tachycardia (9% burden). Heart rate 60-162 bpm.  Avg HR 81 bpm. No atrial fibrillation, supraventricular tachycardia,  ventricular tachycardia,  high grade AV block, pauses (3 seconds or longer). Total ventricular ectopic burden 3.5% (as isolated beats). Total supraventricular ectopic burden <1%. Rare episodes of PSVT.  Fastest episode was 5 beats, 2 seconds, max heart rate 162 bpm (asymptomatic) on 10/26/2022 at 9:49 AM Patient triggered events: 0.  These did not correlate with any significant dysrhythmias.  RADIOLOGY: NA  Risk Assessment/Calculations:   NA   Labs:    External Labs: Collected: March 02, 2021 for [facility] database. Total cholesterol 166, HDL 56, LDL 90, triglycerides 101. A1c 6.4.   Physical Exam:    Today's Vitals   03/09/24 0756  BP: 134/68  Pulse: 70  Resp: 16  SpO2: 91%  Weight: 179 lb (81.2 kg)  Height: 5' 4 (1.626 m)   Body mass index is 30.73 kg/m. Wt Readings from Last 3 Encounters:  03/09/24 179 lb (81.2 kg)  02/02/24 177 lb 9.6 oz (80.6 kg)  05/12/23 175 lb (79.4 kg)    Physical Exam  Constitutional: No distress.  Age appropriate, hemodynamically stable.   Neck: No JVD present.  Cardiovascular: Normal rate, regular rhythm, S1 normal, S2 normal, intact distal pulses and normal pulses. Exam reveals no gallop, no S3 and no S4.  No murmur heard. Pulmonary/Chest: Effort normal and breath sounds normal. No stridor. She has no wheezes. She has no rales.  Abdominal: Soft. Bowel sounds are normal. She exhibits no distension. There is no abdominal tenderness.  Musculoskeletal:        General: No edema.     Cervical back: Neck supple.  Neurological: She is alert and oriented to person, place, and time. She has intact cranial nerves (2-12).  Skin: Skin is warm and moist.     Impression & Recommendation(s):  Impression:   ICD-10-CM   1. PVC (premature ventricular contraction)  I49.3 EKG 12-Lead    LONG TERM MONITOR (3-14 DAYS)    Comprehensive metabolic panel with GFR    CK Total (and CKMB)    Comprehensive metabolic panel with GFR    2. Coronary atherosclerosis  due to calcified coronary lesion of native artery  I25.10 LONG TERM MONITOR (3-14 DAYS)   I25.84 Comprehensive metabolic panel with GFR    CK Total (and CKMB)    Comprehensive metabolic panel with GFR    3. Pure hypercholesterolemia  E78.00 Lipid panel    Comprehensive metabolic panel with GFR    CK Total (and CKMB)    Comprehensive metabolic panel with GFR    Lipid panel    4. Statin intolerance  Z78.9 Lipid panel    Comprehensive metabolic panel with GFR    CK Total (and CKMB)    Comprehensive metabolic panel with GFR    Lipid panel    5. Essential hypertension  I10 Lipid panel    Comprehensive metabolic panel with GFR    CK Total (and CKMB)    Comprehensive metabolic panel with GFR    Lipid panel    6. Right bundle branch block  I45.10 LONG TERM MONITOR (3-14 DAYS)    Comprehensive metabolic panel with GFR    CK Total (and CKMB)    Comprehensive metabolic panel with GFR    7. Former smoker  Z87.891 Lipid panel    Comprehensive metabolic panel with GFR    CK Total (and CKMB)    Comprehensive metabolic panel with GFR    Lipid panel       Recommendation(s):  PVC (premature ventricular contraction) PVC burden March 2023 5%. PVC burden  February 2024 3.5% Currently on verapamil  300 mg p.o. every afternoon. Repeat 3-day monitor in May 2026 prior to the next office visit to reevaluate PVC burden. If patient continues to have elevated PVC burden we will repeat an echocardiogram to reevaluate LVEF otherwise we will continue current medical management  Coronary atherosclerosis due to calcified coronary lesion of native artery Currently on aspirin Prior ischemic workup reviewed. No current indication for repeating noninvasive testing as she is asymptomatic, patient agreeable with the plan of care  Pure hypercholesterolemia Statin intolerance Was on Crestor  20 mg p.o. nightly, but discontinued due to myalgias. We discussed considering restarting Crestor  10 mg p.o. daily or  lower intensity statin therapy or nonstatin options Patient prefers to consider nonstatin options. She recently had fasting lipids with PCP-records are not available in Care Everywhere normal released by PCP.  Patient follows up with them later this week Will start Zetia  10 mg p.o. daily. Fasting lipids, CMP, total CK in 6 weeks  Essential hypertension Office blood pressures are well-controlled. Continue IBU Sartain 150 mg p.o. daily   Orders Placed:  Orders Placed This Encounter  Procedures   Lipid panel    Standing Status:   Future    Number of Occurrences:   1    Expected Date:   03/09/2024    Expiration Date:   03/09/2025    Has the patient fasted?:   Yes   Comprehensive metabolic panel with GFR    Standing Status:   Future    Number of Occurrences:   1    Expected Date:   03/09/2024    Expiration Date:   03/09/2025    Has the patient fasted?:   Yes   CK Total (and CKMB)   LONG TERM MONITOR (3-14 DAYS)    Standing Status:   Future    Number of Occurrences:   1    Expected Date:   12/22/2024    Expiration Date:   03/09/2025    Where should this test be performed?:   [facility]    Does the patient have an implanted cardiac device?:   No    Prescribed days of wear:   3    Type of enrollment:   Home Enrollment    Vendor::   [employer]    Reason for Exam:   Palpitations R00.2   EKG 12-Lead     Final Medication List:    Meds ordered this encounter  Medications   ezetimibe  (ZETIA ) 10 MG tablet    Sig: Take 1 tablet (10 mg total) by mouth daily.    Dispense:  90 tablet    Refill:  3    There are no discontinued medications.   Current Outpatient Medications:    albuterol  (VENTOLIN  HFA) 108 (90 Base) MCG/ACT inhaler, TAKE 2 PUFFS BY MOUTH EVERY 6 HOURS AS NEEDED FOR WHEEZE OR SHORTNESS OF BREATH, Disp: 8.5 each, Rfl: 11   aspirin EC 81 MG tablet, Take 81 mg by mouth daily. Swallow whole., Disp: , Rfl:    Biotin 5000 MCG CAPS, Take 5,000 mcg by mouth daily., Disp: , Rfl:     Budeson-Glycopyrrol-Formoterol (BREZTRI  AEROSPHERE) 160-9-4.8 MCG/ACT AERO, Inhale 2 puffs into the lungs in the morning and at bedtime., Disp: 10.7 g, Rfl: 11   cetirizine (ZYRTEC) 10 MG chewable tablet, Chew 10 mg by mouth daily., Disp: , Rfl:    Cholecalciferol (VITAMIN D3) 1.25 MG (50000 UT) CAPS, Take by mouth., Disp: , Rfl:    cyclobenzaprine  (FLEXERIL ) 10 MG  tablet, Take 10 mg by mouth at bedtime., Disp: , Rfl:    docusate sodium (COLACE) 100 MG capsule, Take 100 mg by mouth every other day., Disp: , Rfl:    esomeprazole  (NEXIUM ) 20 MG capsule, Take 1 capsule (20 mg total) by mouth daily before breakfast., Disp: , Rfl:    ezetimibe  (ZETIA ) 10 MG tablet, Take 1 tablet (10 mg total) by mouth daily., Disp: 90 tablet, Rfl: 3   irbesartan  (AVAPRO ) 150 MG tablet, TAKE 1 TABLET BY MOUTH EVERY DAY, Disp: 90 tablet, Rfl: 1   Melatonin 5 MG CAPS, Take 5 mg by mouth at bedtime., Disp: , Rfl:    meloxicam  (MOBIC ) 15 MG tablet, TAKE 1 TABLET (15 MG TOTAL) BY MOUTH DAILY., Disp: 30 tablet, Rfl: 3   metroNIDAZOLE (METROCREAM) 0.75 % cream, Apply 1 Application topically 2 (two) times daily., Disp: , Rfl:    trospium (SANCTURA) 20 MG tablet, Take 20 mg by mouth at bedtime., Disp: , Rfl:    Verapamil  HCl CR 300 MG CP24, TAKE 1 CAPSULE BY MOUTH EVERYDAY AT BEDTIME, Disp: 90 capsule, Rfl: 2  Consent:   NA  Disposition:   1 year follow-up sooner if needed  Her questions and concerns were addressed to her satisfaction. She voices understanding of the recommendations provided during this encounter.    Signed, Madonna Michele HAS, [facility]  A Division of [facility] [address] [place], [place] [address]  [place], [place] [address] 03/09/2024 9:01 AM

## 2024-03-10 ENCOUNTER — Ambulatory Visit: Payer: Self-pay | Admitting: Cardiology

## 2024-03-11 ENCOUNTER — Other Ambulatory Visit: Payer: Self-pay | Admitting: Cardiology

## 2024-03-11 DIAGNOSIS — I493 Ventricular premature depolarization: Secondary | ICD-10-CM

## 2024-03-14 ENCOUNTER — Encounter (HOSPITAL_COMMUNITY): Payer: Self-pay | Admitting: *Deleted

## 2024-03-14 ENCOUNTER — Emergency Department (HOSPITAL_COMMUNITY)
Admission: EM | Admit: 2024-03-14 | Discharge: 2024-03-14 | Disposition: A | Discharge status: Home / Self Care | Source: Ambulatory Visit | Attending: Emergency Medicine | Admitting: Emergency Medicine

## 2024-03-14 ENCOUNTER — Other Ambulatory Visit: Payer: Self-pay

## 2024-03-14 ENCOUNTER — Emergency Department (HOSPITAL_COMMUNITY)

## 2024-03-14 ENCOUNTER — Encounter: Payer: Self-pay | Admitting: Cardiology

## 2024-03-14 DIAGNOSIS — Z79899 Other long term (current) drug therapy: Secondary | ICD-10-CM | POA: Diagnosis not present

## 2024-03-14 DIAGNOSIS — J45909 Unspecified asthma, uncomplicated: Secondary | ICD-10-CM | POA: Insufficient documentation

## 2024-03-14 DIAGNOSIS — Z91040 Latex allergy status: Secondary | ICD-10-CM | POA: Insufficient documentation

## 2024-03-14 DIAGNOSIS — I2510 Atherosclerotic heart disease of native coronary artery without angina pectoris: Secondary | ICD-10-CM | POA: Diagnosis not present

## 2024-03-14 DIAGNOSIS — R531 Weakness: Secondary | ICD-10-CM | POA: Diagnosis present

## 2024-03-14 DIAGNOSIS — Z7951 Long term (current) use of inhaled steroids: Secondary | ICD-10-CM | POA: Insufficient documentation

## 2024-03-14 DIAGNOSIS — I10 Essential (primary) hypertension: Secondary | ICD-10-CM | POA: Insufficient documentation

## 2024-03-14 DIAGNOSIS — Z7982 Long term (current) use of aspirin: Secondary | ICD-10-CM | POA: Diagnosis not present

## 2024-03-14 LAB — TROPONIN I (HIGH SENSITIVITY): Troponin I (High Sensitivity): 5 ng/L (ref <18)

## 2024-03-14 LAB — COMPREHENSIVE METABOLIC PANEL WITH GFR
ALT: 22 U/L (ref 0–44)
AST: 16 U/L (ref 15–41)
Albumin: 3.8 g/dL (ref 3.5–5.0)
Alkaline Phosphatase: 109 U/L (ref 38–126)
Anion gap: 11 (ref 5–15)
BUN: 20 mg/dL (ref 8–23)
CO2: 25 mmol/L (ref 22–32)
Calcium: 9.4 mg/dL (ref 8.9–10.3)
Chloride: 104 mmol/L (ref 98–111)
Creatinine, Ser: 0.84 mg/dL (ref 0.44–1.00)
GFR, Estimated: >60 mL/min (ref >60)
Glucose, Bld: 102 mg/dL — ABNORMAL HIGH (ref 70–99)
Potassium: 4.2 mmol/L (ref 3.5–5.1)
Sodium: 140 mmol/L (ref 135–145)
Total Bilirubin: 0.5 mg/dL (ref 0.0–1.2)
Total Protein: 7 g/dL (ref 6.5–8.1)

## 2024-03-14 LAB — CBC
HCT: 47 % — ABNORMAL HIGH (ref 36.0–46.0)
Hemoglobin: 15.4 g/dL — ABNORMAL HIGH (ref 12.0–15.0)
MCH: 29.6 pg (ref 26.0–34.0)
MCHC: 32.8 g/dL (ref 30.0–36.0)
MCV: 90.4 fL (ref 80.0–100.0)
Platelets: 273 K/uL (ref 150–400)
RBC: 5.2 MIL/uL — ABNORMAL HIGH (ref 3.87–5.11)
RDW: 13.3 % (ref 11.5–15.5)
WBC: 9.2 K/uL (ref 4.0–10.5)
nRBC: 0 % (ref 0.0–0.2)

## 2024-03-14 NOTE — ED Triage Notes (Signed)
 Pt had weakness in both arms with lightheadedness, dizziness, SOB and nausea that occurred Thursday morning til Saturday.  Pt denies symptoms returning.  Pt was told by her PCP to contact her cardiologist and cardiologist told pt to go to ER right away for stroke work up.  Pt denies any symptoms.  Denies pain.

## 2024-03-14 NOTE — Telephone Encounter (Signed)
 Gave pt Dr. Tyree recommendation. She is in agreement and states she lives in [place]. , so will likely go to [facility] ER.

## 2024-03-14 NOTE — Telephone Encounter (Signed)
 Patient is being seen for PVCs and CAC.  Her symptoms are quite vague and long standing - given focal symptoms have her be seen in ER for evaluation would have to rule out stroke or sequale.   Dr. Lakela Kuba

## 2024-03-14 NOTE — Telephone Encounter (Signed)
 Called and spoke to pt regarding her symptoms. She has had episodes similar to this one several weeks ago but they only lasted several minutes and then would resolve. This episode occurred after seeing Dr. Michele on 03/09/24 for annual f/u visit and the symptoms lasted thru Saturday, 03/11/24. While pushing the grocery cart her arms became very heavy, almost painful, and also felt lightheaded, slightly SOB, slightly nauseated and weak. She finished her grocery shopping and went home.   Advised pt to reach out to us  if she has not heard back on 03/15/24 by mid afternoon. ED precautions reviewed with patient who verbalized understanding.

## 2024-03-14 NOTE — ED Provider Notes (Signed)
 [place] EMERGENCY DEPARTMENT AT [facility] Provider Note   CSN: [id number] Arrival date & time: 03/14/24  1829     Patient presents with: Weakness   [name] is a 68 y.o. female.    Weakness Patient sent in by her cardiologist to rule out stroke.  Has had episodes of feeling bad.  States she will feel lightheaded and feel as if she would pass out.  Around 2 to 3 weeks ago had 2 separate episodes that happened while she was walking.  Felt generally weak.  Mild shortness of breath.  No fevers.  Had been feeling back to normal after.  However this weekend was at [facility] and felt as if her both arms are very weak and somewhat achy.  No headache.  No confusion.  No chest pain.  States she was not aware of her heartbeat however.  Saw PCP who told her to follow-up with her cardiologist.  Discussed with cardiologist and I reviewed the notes and he sent to the ER for stroke rule out.    Past Medical History:  Diagnosis Date   Allergy    Asthma    Coronary atherosclerosis due to calcified coronary lesion    Dyspnea    GERD (gastroesophageal reflux disease)    Heartburn    High blood pressure    History of COVID-19    Overactive bladder    PONV (postoperative nausea and vomiting)    PVC (premature ventricular contraction)     Prior to Admission medications   Medication Sig Start Date End Date Taking? Authorizing Provider  albuterol  (VENTOLIN  HFA) 108 (90 Base) MCG/ACT inhaler TAKE 2 PUFFS BY MOUTH EVERY 6 HOURS AS NEEDED FOR WHEEZE OR SHORTNESS OF BREATH 08/29/21   Shelah Lamar RAMAN, MD  aspirin EC 81 MG tablet Take 81 mg by mouth daily. Swallow whole.    [provider]  Biotin 5000 MCG CAPS Take 5,000 mcg by mouth daily.    [provider]  Budeson-Glycopyrrol-Formoterol (BREZTRI  AEROSPHERE) 160-9-4.8 MCG/ACT AERO Inhale 2 puffs into the lungs in the morning and at bedtime. 11/18/21   Byrum, Robert S, MD  cetirizine (ZYRTEC) 10 MG chewable tablet Chew  10 mg by mouth daily.    [provider]  Cholecalciferol (VITAMIN D3) 1.25 MG (50000 UT) CAPS Take by mouth.    [provider]  cyclobenzaprine  (FLEXERIL ) 10 MG tablet Take 10 mg by mouth at bedtime.    [provider]  docusate sodium (COLACE) 100 MG capsule Take 100 mg by mouth every other day.    [provider]  esomeprazole  (NEXIUM ) 20 MG capsule Take 1 capsule (20 mg total) by mouth daily before breakfast. 03/30/23   Ezzard Sonny RAMAN, PA-C  ezetimibe  (ZETIA ) 10 MG tablet Take 1 tablet (10 mg total) by mouth daily. 03/09/24 06/07/24  Tolia, Sunit, DO  irbesartan  (AVAPRO ) 150 MG tablet TAKE 1 TABLET BY MOUTH EVERY DAY 09/28/23   Tolia, Sunit, DO  Melatonin 5 MG CAPS Take 5 mg by mouth at bedtime.    [provider]  meloxicam  (MOBIC ) 15 MG tablet TAKE 1 TABLET (15 MG TOTAL) BY MOUTH DAILY. 10/16/22   Hyatt, Max T, DPM  metroNIDAZOLE (METROCREAM) 0.75 % cream Apply 1 Application topically 2 (two) times daily. 01/29/23   [provider]  Trospium Chloride 60 MG CP24 Take 1 capsule by mouth daily.    [provider]  Verapamil  HCl CR 300 MG CP24 TAKE 1 CAPSULE BY MOUTH  EVERYDAY AT BEDTIME 03/13/24   Tolia, Sunit, DO    Allergies: Latex, Breztri  aerosphere [budeson-glycopyrrol-formoterol], Dexlansoprazole, Hydrocodone -acetaminophen , and Oxycodone-acetaminophen     Review of Systems  Neurological:  Positive for weakness.    Updated Vital Signs BP (!) 163/78 (BP Location: Right Arm)   Pulse 90   Temp (!) 97.3 F (36.3 C)   Resp 18   Ht 5' 4 (1.626 m)   Wt 79.4 kg   SpO2 93%   BMI 30.04 kg/m   Physical Exam Vitals and nursing note reviewed.  HENT:     Head: Atraumatic.  Cardiovascular:     Rate and Rhythm: Regular rhythm.  Musculoskeletal:     Cervical back: Neck supple.  Neurological:     Mental Status: She is alert and oriented to person, place, and time. Mental status is at baseline.     Comments: Awake and appropriate.   Good grip strength bilaterally.  Face symmetric.     (all labs ordered are listed, but only abnormal results are displayed) Labs Reviewed  COMPREHENSIVE METABOLIC PANEL WITH GFR - Abnormal; Notable for the following components:      Result Value   Glucose, Bld 102 (*)    All other components within normal limits  CBC - Abnormal; Notable for the following components:   RBC 5.20 (*)    Hemoglobin 15.4 (*)    HCT 47.0 (*)    All other components within normal limits  TROPONIN I (HIGH SENSITIVITY)    EKG: None  Radiology: CT Head Wo Contrast Result Date: 03/14/2024 EXAM: CT HEAD WITHOUT CONTRAST 03/14/2024 07:18:10 PM TECHNIQUE: CT of the head was performed without the administration of intravenous contrast. Automated exposure control, iterative reconstruction, and/or weight based adjustment of the mA/kV was utilized to reduce the radiation dose to as low as reasonably achievable. COMPARISON: 07/05/2021 CLINICAL HISTORY: Neuro deficit, acute, stroke suspected. Pt had weakness in both arms with lightheadedness, dizziness, SOB and nausea that occurred Thursday morning til Saturday. Pt denies symptoms returning. Pt was told by her PCP to contact her cardiologist and cardiologist told pt to go to ER right away for stroke work up. Pt denies any symptoms. Denies pain. FINDINGS: BRAIN AND VENTRICLES: No acute hemorrhage. Gray-white differentiation is preserved. No hydrocephalus. No extra-axial collection. No mass effect or midline shift. Mild subcortical and periventricular small vessel ischemic changes. ORBITS: No acute abnormality. SINUSES: No acute abnormality. SOFT TISSUES AND SKULL: No acute soft tissue abnormality. No skull fracture. IMPRESSION: 1. No acute intracranial abnormality. 2. Mild small vessel ischemic changes. Electronically signed by: Pinkie Pebbles MD 03/14/2024 07:23 PM EDT RP Workstation: [id number]     Procedures   Medications Ordered in the ED - No data to display                                   Medical Decision Making Amount and/or Complexity of Data Reviewed Labs: ordered. Radiology: ordered.   Patient sent in for stroke rule out.  Did have some generalized weakness but not lateralizing.  Will get head CT.  Will also get basic blood work to look for causes such as arrhythmia and MI.  Reviewed cardiology note.   Workup reassuring.  Blood work reassuring.  Negative troponin.  Head CT reassuring.  No focal findings.  Can follow-up with PCP.  Will discharge home.    Final diagnoses:  Weakness    ED Discharge Orders  None          Patsey Lot, MD 03/14/24 1958

## 2024-03-14 NOTE — Discharge Instructions (Signed)
 Your workup was reassuring.  Follow-up with your doctor as needed.

## 2024-03-16 ENCOUNTER — Ambulatory Visit: Admitting: Podiatry

## 2024-03-16 DIAGNOSIS — L600 Ingrowing nail: Secondary | ICD-10-CM | POA: Diagnosis not present

## 2024-03-16 DIAGNOSIS — M791 Myalgia, unspecified site: Secondary | ICD-10-CM | POA: Insufficient documentation

## 2024-03-16 DIAGNOSIS — E1142 Type 2 diabetes mellitus with diabetic polyneuropathy: Secondary | ICD-10-CM | POA: Insufficient documentation

## 2024-03-16 DIAGNOSIS — M7752 Other enthesopathy of left foot: Secondary | ICD-10-CM

## 2024-03-16 DIAGNOSIS — H539 Unspecified visual disturbance: Secondary | ICD-10-CM | POA: Insufficient documentation

## 2024-03-16 DIAGNOSIS — M5090 Cervical disc disorder, unspecified, unspecified cervical region: Secondary | ICD-10-CM | POA: Insufficient documentation

## 2024-03-16 DIAGNOSIS — M19072 Primary osteoarthritis, left ankle and foot: Secondary | ICD-10-CM

## 2024-03-16 DIAGNOSIS — Z6830 Body mass index (BMI) 30.0-30.9, adult: Secondary | ICD-10-CM | POA: Insufficient documentation

## 2024-03-16 DIAGNOSIS — M7751 Other enthesopathy of right foot: Secondary | ICD-10-CM

## 2024-03-16 DIAGNOSIS — B0229 Other postherpetic nervous system involvement: Secondary | ICD-10-CM | POA: Insufficient documentation

## 2024-03-16 DIAGNOSIS — H5712 Ocular pain, left eye: Secondary | ICD-10-CM | POA: Insufficient documentation

## 2024-03-16 DIAGNOSIS — M19071 Primary osteoarthritis, right ankle and foot: Secondary | ICD-10-CM

## 2024-03-16 DIAGNOSIS — R519 Headache, unspecified: Secondary | ICD-10-CM | POA: Insufficient documentation

## 2024-03-16 DIAGNOSIS — I8393 Asymptomatic varicose veins of bilateral lower extremities: Secondary | ICD-10-CM | POA: Insufficient documentation

## 2024-03-16 DIAGNOSIS — M79672 Pain in left foot: Secondary | ICD-10-CM | POA: Insufficient documentation

## 2024-03-17 NOTE — Telephone Encounter (Signed)
 [name],  Please call the patient and address her concerns.  It was our office's mistake to send her a monitor before its due.   Benz Vandenberghe Parnell, DO, FACC

## 2024-03-17 NOTE — Telephone Encounter (Signed)
 Have her come in for re-evaluation w/ APP.   Cathy Sime [place], DO, FACC

## 2024-03-19 NOTE — Progress Notes (Signed)
 She presents today chief complaint of plantar bilateral pain tingling and numbness times the past several months seems to be worse at night affecting 2nd and 3rd toes bilateral she also feels that she has an ingrown toenail toenail on the second toe on that left foot.  Objective: Vital signs are stable alert oriented x 3.  Pulses are palpable.  She has tenderness on end range of motion of the second metatarsal phalangeal joints bilaterally.  Slight ingrown toenail to the tibial border of the second toe right foot.  Assessment: Capsulitis second metatarsal phalangeal joint.  Possible neuropathy second metatarsal phalangeal joint area.  Possible ingrown toenail second digit right foot.  Plan: Discussed etiology pathology and surgical therapies injected Kenalog  and local anesthetic to the second metatarsal phalangeal joint area right and left foot.

## 2024-03-21 ENCOUNTER — Ambulatory Visit: Attending: Cardiology

## 2024-03-21 ENCOUNTER — Other Ambulatory Visit: Payer: Self-pay | Admitting: *Deleted

## 2024-03-21 DIAGNOSIS — I493 Ventricular premature depolarization: Secondary | ICD-10-CM

## 2024-03-21 DIAGNOSIS — I4510 Unspecified right bundle-branch block: Secondary | ICD-10-CM

## 2024-03-21 NOTE — Progress Notes (Unsigned)
 Enrolled for [employer] to mail a ZIO XT long term holter monitor to the patients address on file.   Requested monitor to be mailed 12/22/2024.

## 2024-03-22 DIAGNOSIS — M19072 Primary osteoarthritis, left ankle and foot: Secondary | ICD-10-CM | POA: Diagnosis not present

## 2024-03-22 DIAGNOSIS — M19071 Primary osteoarthritis, right ankle and foot: Secondary | ICD-10-CM | POA: Diagnosis not present

## 2024-03-22 MED ORDER — TRIAMCINOLONE ACETONIDE 40 MG/ML IJ SUSP
40.0000 mg | Freq: Once | INTRAMUSCULAR | Status: AC
  Administered 2024-03-22: 40 mg

## 2024-03-22 NOTE — Addendum Note (Signed)
 Addended by: ELAYNE ROSINA BRAVO on: 03/22/2024 04:05 PM   Modules accepted: Orders

## 2024-04-05 ENCOUNTER — Other Ambulatory Visit: Payer: Self-pay | Admitting: Cardiology

## 2024-04-05 DIAGNOSIS — I10 Essential (primary) hypertension: Secondary | ICD-10-CM

## 2024-04-06 NOTE — Progress Notes (Signed)
 Cardiology Office Note   Date:  04/07/2024  ID:  [name], DOB 08/14/1956, MRN [id number] PCP: Vernadine Charlie ORN, MD  [facility] Providers Cardiologist:  Madonna Large, DO    History of Present Illness [name] is a 68 y.o. female with a past medical history of tachycardia, COVID x 2, hypertension, hyperlipidemia, CAD, aortic atherosclerosis, premature ventricular contractions, mitral regurgitation, postmenopausal female here for follow-up appointment.  Her past medical history includes being referred to the practice for management of tachycardia/PVCs as well as CAD.  Noted to have PVC burden of approximately 5% in the past which were multifocal PVCs and she was symptomatic.  Started on calcium  channel blocker and the dose was uptitrated until she became asymptomatic.  Follow-up cardiac monitor noted improvement in overall PVC burden.  She was seen in the office July 2024 and presented for a 1 year follow-up at that time.  She denied any anginal chest pain or heart failure symptoms.  No hospitalizations or urgent care visits for cardiovascular reasons.  Compliant with medication therapy.  Stable weight.  She walks for 40 to 45 minutes 3 to 4 days a week and also enjoys gardening.  Today, she presents with coronary artery disease who presents with episodes of weakness and dizziness.  She experiences episodes of weakness and dizziness, initially feeling weak and nauseous after an annual checkup and breakfast. This was followed by discomfort and extreme weakness in her arms while shopping, accompanied by dizziness and nausea. These symptoms persisted throughout the day and into the next day, limiting her activities to resting on the couch.  Her husband reminded her of two prior episodes where she felt like she was going to pass out while in stores, which resolved within a few minutes. These episodes occurred before her recent annual checkup. Following these incidents, she went  to the emergency room, where a head CT showed no acute abnormalities, and an EKG revealed a right bundle branch block, consistent with previous findings from the prior year.  She has a family history of heart disease, including congestive heart failure in her father and heart attacks in her grandparents. She has experienced side effects from cholesterol medications, including severe pain in her feet from statins and abdominal pain from a new medication, which resolved upon discontinuation. She is currently taking Zetia  for cholesterol management.  No palpitations or swelling in her feet or ankles, except for swelling during long car rides.  Reports no chest pain, pressure, or tightness. No orthopnea, PND. Reports no palpitations.   Discussed the use of AI scribe software for clinical note transcription with the patient, who gave verbal consent to proceed.  ROS: Pertinent ROS in HPI  Studies Reviewed      Echocardiogram: 08/06/2021: Normal LV systolic function with visual EF 60-65%. Left ventricle cavity is normal in size. Normal left ventricular wall thickness. Normal global wall motion. Normal diastolic filling pattern, normal LAP.  Trace tricuspid regurgitation. No evidence of pulmonary hypertension. Insignificant pericardial effusion. There is no hemodynamic significance. Compared to study 07/22/2016 LVEF remains stable, MR and TR have improved, otherwise no significant change.    Stress Testing:  Nuclear stress test  [08/06/2016]: Myocardial perfusion imaging is normal.  See report for additional details    Cardiac monitor: 3 day extended Holter monitor: March 2023 Dominant rhythm normal sinus., followed by sinus tachycardia (10%). Heart rate 55-133 bpm.  Avg HR 83 bpm. No atrial fibrillation, supraventricular tachycardia, ventricular tachycardia, high grade AV block, pauses (  3 seconds or longer). Total ventricular ectopic burden 5% (predominantly isolated PVCs but also present in  ventricular bigeminy/trigeminal pattern). Total supraventricular ectopic burden <1%. Patient triggered events: 0.     Cardiac monitor (Zio Patch): October 19, 2022 - October 27, 2022 Dominant rhythm sinus, followed by tachycardia (9% burden). Heart rate 60-162 bpm.  Avg HR 81 bpm. No atrial fibrillation, supraventricular tachycardia, ventricular tachycardia, high grade AV block, pauses (3 seconds or longer). Total ventricular ectopic burden 3.5% (as isolated beats). Total supraventricular ectopic burden <1%. Rare episodes of PSVT.  Fastest episode was 5 beats, 2 seconds, max heart rate 162 bpm (asymptomatic) on 10/26/2022 at 9:49 AM Patient triggered events: 0.  These did not correlate with any significant dysrhythmias.   Physical Exam VS:  BP 127/78   Pulse 72   Ht 5' 4.5 (1.638 m)   Wt 176 lb (79.8 kg)   SpO2 93%   BMI 29.74 kg/m        Wt Readings from Last 3 Encounters:  04/07/24 176 lb (79.8 kg)  03/14/24 175 lb (79.4 kg)  03/09/24 179 lb (81.2 kg)    GEN: Well nourished, well developed in no acute distress NECK: No JVD; No carotid bruits CARDIAC: RRR, no murmurs, rubs, gallops RESPIRATORY:  Clear to auscultation without rales, wheezing or rhonchi  ABDOMEN: Soft, non-tender, non-distended EXTREMITIES:  No edema; No deformity   ASSESSMENT AND PLAN  Episodes of arm weakness, dizziness, and nausea Episodes occurred post-checkup, lasting three days. Head CT negative for acute abnormalities, but small vessel ischemic changes noted. Right bundle branch block on EKG unchanged. Differential includes stroke or mini-stroke. Echocardiogram from 2022 showed mild mitral valve regurgitation, possibly contributing to symptoms. - Order echocardiogram to assess heart valves and pump function. - Evaluate echocardiogram results for changes in heart function or valve status.  Mild mitral valve regurgitation Mild mitral valve regurgitation noted on 2022 echocardiogram, potential contributor  to symptoms. - Order echocardiogram to assess mitral valve regurgitation.  Right bundle branch block Right bundle branch block consistent with previous EKG. No new ischemic changes. No immediate intervention required.  Hyperlipidemia with statin intolerance LDL 161. Statin intolerance with severe extremity pain and abdominal pain from current medication. Discussed alternatives including Nexelzet and injectables. She prefers oral medication. - Initiate prior authorization for Nexelzet. - Continue Zetia  until Nexlizet approved. - Monitor for Nexelzet side effects once started.  Family history of coronary artery disease Strong family history increases risk for cardiovascular events, influencing aggressive hyperlipidemia management.  PVCs -low burden on past monitors -plan for zio May of 2026 per Dr. Michele     Dispo: 3 months with Dr. Michele or me  Signed, Orren LOISE Fabry, PA-C

## 2024-04-07 ENCOUNTER — Ambulatory Visit: Attending: Physician Assistant | Admitting: Physician Assistant

## 2024-04-07 ENCOUNTER — Telehealth: Payer: Self-pay | Admitting: Pharmacy Technician

## 2024-04-07 ENCOUNTER — Other Ambulatory Visit (HOSPITAL_COMMUNITY): Payer: Self-pay

## 2024-04-07 VITALS — BP 127/78 | HR 72 | Ht 64.5 in | Wt 176.0 lb

## 2024-04-07 DIAGNOSIS — Z789 Other specified health status: Secondary | ICD-10-CM

## 2024-04-07 DIAGNOSIS — E7800 Pure hypercholesterolemia, unspecified: Secondary | ICD-10-CM

## 2024-04-07 DIAGNOSIS — R0609 Other forms of dyspnea: Secondary | ICD-10-CM

## 2024-04-07 DIAGNOSIS — I10 Essential (primary) hypertension: Secondary | ICD-10-CM

## 2024-04-07 DIAGNOSIS — I493 Ventricular premature depolarization: Secondary | ICD-10-CM | POA: Diagnosis not present

## 2024-04-07 DIAGNOSIS — R0602 Shortness of breath: Secondary | ICD-10-CM

## 2024-04-07 DIAGNOSIS — I2510 Atherosclerotic heart disease of native coronary artery without angina pectoris: Secondary | ICD-10-CM

## 2024-04-07 DIAGNOSIS — I2584 Coronary atherosclerosis due to calcified coronary lesion: Secondary | ICD-10-CM

## 2024-04-07 NOTE — Patient Instructions (Signed)
 Medication Instructions:  Your physician recommends that you continue on your current medications as directed. Please refer to the Current Medication list given to you today.  *If you need a refill on your cardiac medications before your next appointment, please call your pharmacy*  Lab Work: NONE ordered at this time of appointment   Testing/Procedures: Your physician has requested that you have an echocardiogram. Echocardiography is a painless test that uses sound waves to create images of your heart. It provides your doctor with information about the size and shape of your heart and how well your heart's chambers and valves are working. This procedure takes approximately one hour. There are no restrictions for this procedure. Please do NOT wear cologne, perfume, aftershave, or lotions (deodorant is allowed). Please arrive 15 minutes prior to your appointment time.  Please note: We ask at that you not bring children with you during ultrasound (echo/ vascular) testing. Due to room size and safety concerns, children are not allowed in the ultrasound rooms during exams. Our front office staff cannot provide observation of children in our lobby area while testing is being conducted. An adult accompanying a patient to their appointment will only be allowed in the ultrasound room at the discretion of the ultrasound technician under special circumstances. We apologize for any inconvenience.   Follow-Up: At [facility], you and your health needs are our priority.  As part of our continuing mission to provide you with exceptional heart care, our providers are all part of one team.  This team includes your primary Cardiologist (physician) and Advanced Practice Providers or APPs (Physician Assistants and Nurse Practitioners) who all work together to provide you with the care you need, when you need it.  Your next appointment:   3 month(s)  Provider:   Madonna Large, DO or Orren Fabry, PA-C           We recommend signing up for the patient portal called MyChart.  Sign up information is provided on this After Visit Summary.  MyChart is used to connect with patients for Virtual Visits (Telemedicine).  Patients are able to view lab/test results, encounter notes, upcoming appointments, etc.  Non-urgent messages can be sent to your provider as well.   To learn more about what you can do with MyChart, go to [email or web address].

## 2024-04-07 NOTE — Telephone Encounter (Signed)
 Pharmacy Patient Advocate Encounter   Received notification from Physician's Office that prior authorization for Nexlizet is required/requested.   Insurance verification completed.   The patient is insured through [facility] .   Per test claim: PA required; PA submitted to above mentioned insurance via Latent Key/confirmation #/[id number] Status is pending

## 2024-04-07 NOTE — Telephone Encounter (Signed)
 Pharmacy Patient Advocate Encounter  Received notification from [facility] that Prior Authorization for Nexlizet has been APPROVED from 04/07/24 to 04/07/25. Ran test claim, Copay is $122.25- one month. This test claim was processed through [facility]- copay amounts may vary at other pharmacies due to pharmacy/plan contracts, or as the patient moves through the different stages of their insurance plan.   PA #/Case ID/Reference #: [id number]

## 2024-04-18 ENCOUNTER — Encounter: Payer: Self-pay | Admitting: Podiatry

## 2024-04-18 ENCOUNTER — Ambulatory Visit: Admitting: Podiatry

## 2024-04-18 DIAGNOSIS — L600 Ingrowing nail: Secondary | ICD-10-CM | POA: Diagnosis not present

## 2024-04-18 MED ORDER — NEOMYCIN-POLYMYXIN-HC 1 % OT SOLN: OTIC | 1 refills | Status: AC

## 2024-04-18 NOTE — Progress Notes (Signed)
 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  States those shots really helped last time and I really pretty much have no pain there at all.  The majority of the pain is in this ingrown toenail area as she points to the medial border of the second toe right foot.  Objective: Vital signs are stable alert oriented x 3 there is no erythema edema salines drainage or odor sharply abraded nail margin along the tibial border second digit right foot.  No reproducible pain on palpation or range of motion of the metatarsal phalangeal joints or the tarsometatarsal joints bilateral foot.  Assessment: Ingrown toenail tibial border second digit right foot.  Resolving osteoarthritis capsulitis neuritis and arthritis symptoms.  Plan: Discussed etiology pathology and surgical therapies at this point performed a chemical matricectomy to the second digit of the right foot after local anesthetic was administered.  The nail was prepped and draped.  The nail was split along the medial border avulsing the border exposing the root.  The root was then destroyed utilizing phenol it was then flushed with alcohol  dressed a compressive dressing was applied given both oral written home-going stretching care and soaking of the tendon as well as a prescription for Cortisporin Otic to be applied twice daily after soaking.  Remember to ask how her brother and father in [place]  are.  Her father is in hospice care and her brother has aggressive lymphoma.

## 2024-04-18 NOTE — Patient Instructions (Signed)

## 2024-04-22 LAB — COMPREHENSIVE METABOLIC PANEL WITH GFR
ALT: 20 IU/L (ref 0–32)
AST: 14 IU/L (ref 0–40)
Albumin: 4.1 g/dL (ref 3.9–4.9)
Alkaline Phosphatase: 137 IU/L — ABNORMAL HIGH (ref 44–121)
BUN/Creatinine Ratio: 25 (ref 12–28)
BUN: 22 mg/dL (ref 8–27)
Bilirubin Total: 0.4 mg/dL (ref 0.0–1.2)
CO2: 23 mmol/L (ref 20–29)
Calcium: 9.7 mg/dL (ref 8.7–10.3)
Chloride: 101 mmol/L (ref 96–106)
Creatinine, Ser: 0.87 mg/dL (ref 0.57–1.00)
Globulin, Total: 2.4 g/dL (ref 1.5–4.5)
Glucose: 107 mg/dL — ABNORMAL HIGH (ref 70–99)
Potassium: 4.6 mmol/L (ref 3.5–5.2)
Sodium: 140 mmol/L (ref 134–144)
Total Protein: 6.5 g/dL (ref 6.0–8.5)
eGFR: 72 mL/min/1.73 (ref >59)

## 2024-04-22 LAB — LIPID PANEL
Chol/HDL Ratio: 3.2 ratio (ref 0.0–4.4)
Cholesterol, Total: 224 mg/dL — ABNORMAL HIGH (ref 100–199)
HDL: 69 mg/dL (ref >39)
LDL Chol Calc (NIH): 136 mg/dL — ABNORMAL HIGH (ref 0–99)
Triglycerides: 107 mg/dL (ref 0–149)
VLDL Cholesterol Cal: 19 mg/dL (ref 5–40)

## 2024-04-22 LAB — CK TOTAL AND CKMB (NOT AT ARMC)
CK-MB Index: 2.5 ng/mL (ref 0.0–5.3)
Total CK: 75 U/L (ref 32–182)

## 2024-04-26 ENCOUNTER — Ambulatory Visit: Payer: Self-pay | Admitting: Cardiology

## 2024-05-15 LAB — LAB REPORT - SCANNED: EGFR: 71.1

## 2024-05-16 ENCOUNTER — Ambulatory Visit (HOSPITAL_COMMUNITY)
Admission: RE | Admit: 2024-05-16 | Discharge: 2024-05-16 | Disposition: A | Discharge status: Home / Self Care | Source: Ambulatory Visit | Attending: Internal Medicine | Admitting: Internal Medicine

## 2024-05-16 ENCOUNTER — Ambulatory Visit: Payer: Self-pay | Admitting: Cardiology

## 2024-05-16 ENCOUNTER — Ambulatory Visit: Admitting: Podiatry

## 2024-05-16 DIAGNOSIS — R0609 Other forms of dyspnea: Secondary | ICD-10-CM | POA: Diagnosis present

## 2024-05-16 DIAGNOSIS — R0602 Shortness of breath: Secondary | ICD-10-CM | POA: Insufficient documentation

## 2024-05-16 LAB — ECHOCARDIOGRAM COMPLETE
Area-P 1/2: 2.97 cm2
S' Lateral: 2.1 cm

## 2024-05-17 ENCOUNTER — Encounter: Payer: Self-pay | Admitting: Cardiology

## 2024-05-17 NOTE — Progress Notes (Signed)
 MyChart message containing providers result note and interpretation read by patient on: Last read by [name] Red at 7:29PM on 05/16/2024.

## 2024-05-18 ENCOUNTER — Ambulatory Visit: Admitting: Podiatry

## 2024-05-18 DIAGNOSIS — L600 Ingrowing nail: Secondary | ICD-10-CM

## 2024-05-18 NOTE — Progress Notes (Signed)
 She presents today for follow-up of her matricectomy tibial border second digit right foot.  She denies fever chills nausea vomiting muscle aches pains.  States that she is no longer soaking because it has scabbed over.  She is no longer wearing a Band-Aid.  Objective: Vital signs are stable she is alert oriented x 3 there is no erythema edema cellulitis drainage or odor.  Appears to be healing very nicely.  Assessment: Well-healing matrixectomy second digit right foot noncomplicated.  Plan: Follow-up with me on an as-needed basis.  Remember to ask how her brother and father in [place]  are doing.  Her father is in hospice and her brother has aggressive lymphoma

## 2024-05-19 ENCOUNTER — Ambulatory Visit: Payer: Self-pay | Admitting: Physician Assistant

## 2024-05-19 NOTE — Progress Notes (Signed)
 Patient viewed in [place] of her echo   Last read by Barnie LITTIE Red at 1:04PM on 05/19/2024.

## 2024-05-24 NOTE — Telephone Encounter (Signed)
 Diastolic dysfunction will be discussed at his next office visit.  But in summary, the heart takes longer time to relax after every heartbeat which may lead to congestion and patient usually experience shortness of breath, swelling.  The causes very but can include but not limited to older age, hypertension, diabetes, etc.  Tessa - FYI. Has an appoint with you coming up.   Dr. Pattiann Solanki

## 2024-06-29 ENCOUNTER — Encounter: Payer: Self-pay | Admitting: Cardiology

## 2024-06-29 ENCOUNTER — Ambulatory Visit: Attending: Cardiology | Admitting: Cardiology

## 2024-06-29 VITALS — BP 146/84 | HR 86 | Resp 16 | Ht 64.0 in | Wt 180.6 lb

## 2024-06-29 DIAGNOSIS — Z79899 Other long term (current) drug therapy: Secondary | ICD-10-CM

## 2024-06-29 DIAGNOSIS — I493 Ventricular premature depolarization: Secondary | ICD-10-CM

## 2024-06-29 DIAGNOSIS — E7800 Pure hypercholesterolemia, unspecified: Secondary | ICD-10-CM

## 2024-06-29 DIAGNOSIS — I10 Essential (primary) hypertension: Secondary | ICD-10-CM | POA: Diagnosis not present

## 2024-06-29 DIAGNOSIS — I5189 Other ill-defined heart diseases: Secondary | ICD-10-CM

## 2024-06-29 DIAGNOSIS — R0609 Other forms of dyspnea: Secondary | ICD-10-CM | POA: Diagnosis not present

## 2024-06-29 DIAGNOSIS — I2584 Coronary atherosclerosis due to calcified coronary lesion: Secondary | ICD-10-CM

## 2024-06-29 DIAGNOSIS — I2510 Atherosclerotic heart disease of native coronary artery without angina pectoris: Secondary | ICD-10-CM

## 2024-06-29 DIAGNOSIS — R0602 Shortness of breath: Secondary | ICD-10-CM

## 2024-06-29 DIAGNOSIS — Z789 Other specified health status: Secondary | ICD-10-CM

## 2024-06-29 MED ORDER — SPIRONOLACTONE 25 MG PO TABS: 25.0000 mg | ORAL_TABLET | Freq: Every morning | ORAL | 1 refills | Status: AC

## 2024-06-29 MED ORDER — METOPROLOL TARTRATE 25 MG PO TABS: ORAL_TABLET | ORAL | 3 refills | Status: AC

## 2024-06-29 NOTE — Patient Instructions (Signed)
 Medication Instructions:  Your physician has recommended you make the following change in your medication:   ** Begin Spironolactone 25mg  - 1 tablet by mouth daily each morning.  ** Take your Avapro  at night  *If you need a refill on your cardiac medications before your next appointment, please call your pharmacy*  Lab Work: BMET in one week If you have labs (blood work) drawn today and your tests are completely normal, you will receive your results only by: MyChart Message (if you have MyChart) OR A paper copy in the mail If you have any lab test that is abnormal or we need to change your treatment, we will call you to review the results.  Testing/Procedures: Your physician has requested that you have cardiac CT. Cardiac computed tomography (CT) is a painless test that uses an x-ray machine to take clear, detailed pictures of your heart. For further information please visit [email or web address] Please follow instruction sheet as given.    Follow-Up: At [facility], you and your health needs are our priority.  As part of our continuing mission to provide you with exceptional heart care, our providers are all part of one team.  This team includes your primary Cardiologist (physician) and Advanced Practice Providers or APPs (Physician Assistants and Nurse Practitioners) who all work together to provide you with the care you need, when you need it.  Your next appointment:   3 months with Dr Michele  Other Instructions   Your cardiac CT will be scheduled at one of the below locations:    [facility] [address]  [place], [place] [address]   If scheduled at [facility], please arrive at the [facility] and Children's Entrance (Entrance C2) of [facility] 30 minutes prior to test start time. You can use the FREE valet parking offered at entrance C (encouraged to control the heart rate for the test)  Proceed to the [facility]  Radiology Department (first floor) to check-in and test prep.  All radiology patients and guests should use entrance C2 at [facility], accessed from [facility], even though the hospital's physical address listed is [address].  If scheduled at the Heart and Vascular Tower at [address], please enter the parking lot using the [address] entrance and use the FREE valet service at the patient drop-off area. Enter the building and check-in with registration on the main floor.  If scheduled at [facility], please arrive to the Heart and Vascular Center 15 mins early for check-in and test prep.  There is spacious parking and easy access to the radiology department from the [facility] entrance. Please enter here and check-in with the desk attendant.   If scheduled at [facility], please arrive 30 minutes early for check-in and test prep.  Please follow these instructions carefully (unless otherwise directed):  An IV will be required for this test and Nitroglycerin will be given.  Hold all erectile dysfunction medications at least 3 days (72 hrs) prior to test. (Ie viagra, cialis, sildenafil, tadalafil, etc)   On the Night Before the Test: Be sure to Drink plenty of water . Do not consume any caffeinated/decaffeinated beverages or chocolate 12 hours prior to your test. Do not take any antihistamines 12 hours prior to your test.   On the Day of the Test: Drink plenty of water  until 1 hour prior to the test. Do not eat any food 1  hour prior to test. You may take your regular medications prior to the test.  Take metoprolol (Lopressor) as recommended by Dr Michele If you take Furosemide/Hydrochlorothiazide/Spironolactone/Chlorthalidone, please HOLD on the morning of the test. Patients who wear a continuous glucose monitor MUST remove the device prior to scanning. FEMALES- please wear underwire-free bra if available,  avoid dresses & tight clothing       After the Test: Drink plenty of water . After receiving IV contrast, you may experience a mild flushed feeling. This is normal. On occasion, you may experience a mild rash up to 24 hours after the test. This is not dangerous. If this occurs, you can take Benadryl  25 mg, Zyrtec, Claritin , or Allegra and increase your fluid intake. (Patients taking Tikosyn should avoid Benadryl , and may take Zyrtec, Claritin , or Allegra) If you experience trouble breathing, this can be serious. If it is severe call 911 IMMEDIATELY. If it is mild, please call our office.  We will call to schedule your test 2-4 weeks out understanding that some insurance companies will need an authorization prior to the service being performed.   For more information and frequently asked questions, please visit our website : [email or web address]  For non-scheduling related questions, please contact the cardiac imaging nurse navigator should you have any questions/concerns: Cardiac Imaging Nurse Navigators Direct Office Dial: [phone]   For scheduling needs, including cancellations and rescheduling, please call Brittany, [phone].

## 2024-06-29 NOTE — Progress Notes (Signed)
 Cardiology Office Note:  .   Date:  06/29/2024  ID:  Barnie LITTIE Red, DOB 1956-01-16, MRN [id number] PCP:  Vernadine Charlie ORN, MD  Former Cardiology Providers: Emmalene Lawrence, APRN, FNP-C  [facility] Providers Cardiologist:  Madonna Large, DO , [facility] (established care August 30, 2020) Electrophysiologist:  None  Click to update primary MD,subspecialty MD or APP then REFRESH:1}    Chief Complaint  Patient presents with   Follow-up   Shortness of Breath         History of Present Illness: .   [name] is a 68 y.o. Caucasian female whose past medical history and cardiovascular risk factors includes: History of tachycardia, Hx of COVID x2, hypertension, hyperlipidemia, coronary artery atherosclerosis due to calcified plaque, aortic atherosclerosis, premature ventricular contractions, mitral regurgitation, postmenopausal female, advanced age.  Patient was referred to the practice for management of tachycardia/PVCs as well as coronary artery calcification.  She was noted to have a PVC burden of approximately 5% in the past which were multifocal PVCs and she was symptomatic.  She was started on calcium  channel blockers and the dose was uptitrated until she became asymptomatic.  Follow-up cardiac monitor noted improvement in overall PVC burden.  Since last office visit she saw Orren Fabry in August 2025 for episodes of weakness, dizziness, and nausea.  Echocardiogram was recommended which noted preserved LVEF of 65 to 70%, no regional wall motion abnormalities, grade 2 diastolic dysfunction with elevated left atrial pressures, average GLS -21%, and no significant valvular heart disease.  With regards to her lipid management she remained on Zetia  and was recommended to transition to Nexlizet which was cost prohibitive.  Patient in the past has voiced concerns with statin therapy and did not want to try lower intensity statin therapy or injectable medications.  She experiences  persistent shortness of breath, particularly with exertion, which has been ongoing for years without explanation from previous evaluations. Recently, she had difficulty breathing during physical tasks while cleaning out her father's property.  An echocardiogram was performed as part of her cardiac evaluation. Her current medications include irbesartan , taken in the morning, and verapamil , taken at night.  She has no leg swelling, orthopnea, or paroxysmal nocturnal dyspnea. Her family history is significant for heart-related conditions, including her father who recently passed away from congestive heart failure, and both paternal grandparents who died of heart attacks. Her mother also had heart failure at the time of her death.  Endurance remains stable (walks 40-43minutes 3-4x / week or gardening) .    Review of Systems: .   Review of Systems  Cardiovascular:  Positive for dyspnea on exertion. Negative for chest pain, claudication, irregular heartbeat, leg swelling, near-syncope, orthopnea, palpitations, paroxysmal nocturnal dyspnea and syncope.  Hematologic/Lymphatic: Negative for bleeding problem.    Studies Reviewed:    Echocardiogram: 08/06/2021: Normal LV systolic function with visual EF 60-65%. Left ventricle cavity is normal in size. Normal left ventricular wall thickness. Normal global wall motion. Normal diastolic filling pattern, normal LAP.  Trace tricuspid regurgitation. No evidence of pulmonary hypertension. Insignificant pericardial effusion. There is no hemodynamic significance. Compared to study 07/22/2016 LVEF remains stable, MR and TR have improved, otherwise no significant change.    Stress Testing:  Nuclear stress test  [08/06/2016]: Myocardial perfusion imaging is normal.  See report for additional details   Cardiac monitor: 3 day extended Holter monitor: March 2023 Dominant rhythm normal sinus., followed by sinus tachycardia (10%). Heart rate 55-133 bpm.  Avg HR  83  bpm. No atrial fibrillation, supraventricular tachycardia, ventricular tachycardia, high grade AV block, pauses (3 seconds or longer). Total ventricular ectopic burden 5% (predominantly isolated PVCs but also present in ventricular bigeminy/trigeminal pattern). Total supraventricular ectopic burden <1%. Patient triggered events: 0.     Cardiac monitor (Zio Patch): October 19, 2022 - October 27, 2022 Dominant rhythm sinus, followed by tachycardia (9% burden). Heart rate 60-162 bpm.  Avg HR 81 bpm. No atrial fibrillation, supraventricular tachycardia, ventricular tachycardia, high grade AV block, pauses (3 seconds or longer). Total ventricular ectopic burden 3.5% (as isolated beats). Total supraventricular ectopic burden <1%. Rare episodes of PSVT.  Fastest episode was 5 beats, 2 seconds, max heart rate 162 bpm (asymptomatic) on 10/26/2022 at 9:49 AM Patient triggered events: 0.  These did not correlate with any significant dysrhythmias.  RADIOLOGY: NA  Risk Assessment/Calculations:   NA   Labs:    External Labs: Collected: March 02, 2021 for [facility] database. Total cholesterol 166, HDL 56, LDL 90, triglycerides 101. A1c 6.4.  External Labs: Collected: March 07, 2024 provided by PCP. Total cholesterol 241, triglycerides 117, HDL 57, LDL 161, non-HDL 184. A1c 6.3  External Labs: Collected: May 15, 2024 provided by PCP. Sodium 142, potassium 4.4, chloride 106, bicarb 29 AST, ALT, alkaline phosphatase within normal limits. eGFR 71. BUN 28, creatinine 0.8 Total cholesterol 197, triglycerides 159, HDL 62, LDL 103, non-HDL 135  Physical Exam:    Today's Vitals   06/29/24 0950  BP: (!) 146/84  Pulse: 86  Resp: 16  SpO2: 96%  Weight: 180 lb 9.6 oz (81.9 kg)  Height: 5' 4 (1.626 m)   Body mass index is 31 kg/m. Wt Readings from Last 3 Encounters:  06/29/24 180 lb 9.6 oz (81.9 kg)  04/07/24 176 lb (79.8 kg)  03/14/24 175 lb (79.4 kg)    Physical Exam   Constitutional: No distress.  Age appropriate, hemodynamically stable.   Neck: No JVD present.  Cardiovascular: Normal rate, regular rhythm, S1 normal, S2 normal, intact distal pulses and normal pulses. Exam reveals no gallop, no S3 and no S4.  No murmur heard. Pulmonary/Chest: Effort normal and breath sounds normal. No stridor. She has no wheezes. She has no rales.  Abdominal: Soft. Bowel sounds are normal. She exhibits no distension. There is no abdominal tenderness.  Musculoskeletal:        General: No edema.     Cervical back: Neck supple.  Neurological: She is alert and oriented to person, place, and time. She has intact cranial nerves (2-12).  Skin: Skin is warm and moist.     Impression & Recommendation(s):  Impression:   ICD-10-CM   1. Diastolic dysfunction without heart failure  I51.89 spironolactone (ALDACTONE) 25 MG tablet    Basic metabolic panel with GFR    metoprolol tartrate (LOPRESSOR) 25 MG tablet    Basic metabolic panel with GFR    2. Dyspnea on exertion  R06.09 Basic metabolic panel with GFR    CT CORONARY MORPH W/CTA COR W/SCORE W/CA W/CM &/OR WO/CM    3. Coronary atherosclerosis due to calcified coronary lesion of native artery  I25.10 Basic metabolic panel with GFR   I25.84 CT CORONARY MORPH W/CTA COR W/SCORE W/CA W/CM &/OR WO/CM    4. Essential hypertension  I10     5. PVC's (premature ventricular contractions)  I49.3     6. Pure hypercholesterolemia  E78.00     7. Statin intolerance  Z78.9      Recommendation(s):  Diastolic dysfunction without  heart failure with Dyspnea on exertion at risk of Heart failure Echocardiogram shows grade 2 diastolic dysfunction, increased heart failure risk. Exertional dyspnea persists, possibly cardiac or pulmonary. Spironolactone initiated based on risk factors and symptoms. Discussed spironolactone side effects; benefits outweigh risks. - Started spironolactone 25 mg in the morning. - Continue irbesartan  150 mg at  night. - Ordered BMP in one week to monitor potassium levels. - Ordered coronary CTA to evaluate for cardiac causes of dyspnea. - Prescribed metoprolol 25 mg BID starting two days before coronary CTA.  Coronary atherosclerosis due to calcified coronary lesion of native artery Currently on aspirin 81 mg p.o. daily. Due to continued dyspnea and strong family history of heart disease patient needs concern for possible underlying substrate. Shared decision was to proceed forward with coronary CTA as discussed above  Essential hypertension Blood pressure acceptable but not at goal. Medication changes as discussed above  PVC's (premature ventricular contractions) PVC burden March 2023 5%. PVC burden February 2024 3.5% Repeat 3-day monitor in May 2026  to reevaluate PVC burden. Currently on verapamil  300 mg p.o. every afternoon  Pure hypercholesterolemia Statin intolerance Was on Crestor  20 mg p.o. nightly, but discontinued due to myalgias. Intolerant to statin therapy in the past. Patient does not want to try low intensity statin therapy or injectables. Currently on Zetia  10 mg p.o. daily. Nexlizet cost prohibitive After last visit September 2025 independently reviewed, LDL has improved from 161 mg/dL to 896 mg/dL  Orders Placed:  Orders Placed This Encounter  Procedures   CT CORONARY MORPH W/CTA COR W/SCORE W/CA W/CM &/OR WO/CM    Standing Status:   Future    Expiration Date:   06/29/2025    If indicated for the ordered procedure, I authorize the administration of contrast media per Radiology protocol:   Yes    [facility]   Basic metabolic panel with GFR    Standing Status:   Future    Number of Occurrences:   1    Expected Date:   07/06/2024    Expiration Date:   06/29/2025     Final Medication List:    Meds ordered this encounter  Medications   spironolactone (ALDACTONE) 25 MG tablet    Sig: Take 1 tablet (25 mg total) by  mouth in the morning.    Dispense:  90 tablet    Refill:  1   metoprolol tartrate (LOPRESSOR) 25 MG tablet    Sig: Take Metoprolol 25mg  one tablet by mouth twice daily prior to cardiac CT beginning the day before testing.    Dispense:  4 tablet    Refill:  3    There are no discontinued medications.   Current Outpatient Medications:    metoprolol tartrate (LOPRESSOR) 25 MG tablet, Take Metoprolol 25mg  one tablet by mouth twice daily prior to cardiac CT beginning the day before testing., Disp: 4 tablet, Rfl: 3   spironolactone (ALDACTONE) 25 MG tablet, Take 1 tablet (25 mg total) by mouth in the morning., Disp: 90 tablet, Rfl: 1   albuterol  (VENTOLIN  HFA) 108 (90 Base) MCG/ACT inhaler, TAKE 2 PUFFS BY MOUTH EVERY 6 HOURS AS NEEDED FOR WHEEZE OR SHORTNESS OF BREATH, Disp: 8.5 each, Rfl: 11   aspirin EC 81 MG tablet, Take 81 mg by mouth daily. Swallow whole., Disp: , Rfl:    Biotin 5000 MCG CAPS, Take 5,000 mcg by mouth daily., Disp: , Rfl:    cetirizine (ZYRTEC) 10 MG chewable tablet,  Chew 10 mg by mouth daily. (Patient taking differently: Chew 10 mg by mouth as needed for allergies.), Disp: , Rfl:    Cholecalciferol (VITAMIN D3) 1.25 MG (50000 UT) CAPS, Take by mouth., Disp: , Rfl:    cyclobenzaprine  (FLEXERIL ) 10 MG tablet, Take 10 mg by mouth at bedtime., Disp: , Rfl:    docusate sodium (COLACE) 100 MG capsule, Take 100 mg by mouth every other day., Disp: , Rfl:    esomeprazole  (NEXIUM ) 20 MG capsule, Take 1 capsule (20 mg total) by mouth daily before breakfast. (Patient taking differently: Take 20 mg by mouth 2 (two) times daily before a meal.), Disp: , Rfl:    ezetimibe  (ZETIA ) 10 MG tablet, Take 1 tablet (10 mg total) by mouth daily., Disp: 90 tablet, Rfl: 3   irbesartan  (AVAPRO ) 150 MG tablet, TAKE 1 TABLET BY MOUTH EVERY DAY, Disp: 90 tablet, Rfl: 3   Melatonin 5 MG CAPS, Take 5 mg by mouth at bedtime., Disp: , Rfl:    meloxicam  (MOBIC ) 15 MG tablet, TAKE 1 TABLET (15 MG TOTAL) BY MOUTH  DAILY., Disp: 30 tablet, Rfl: 3   metroNIDAZOLE (METROCREAM) 0.75 % cream, Apply 1 Application topically 2 (two) times daily., Disp: , Rfl:    NEOMYCIN -POLYMYXIN-HYDROCORTISONE (CORTISPORIN) 1 % SOLN OTIC solution, Apply 1-2 drops to toe BID after soaking, Disp: 10 mL, Rfl: 1   Trospium Chloride 60 MG CP24, Take 1 capsule by mouth daily., Disp: , Rfl:    Verapamil  HCl CR 300 MG CP24, TAKE 1 CAPSULE BY MOUTH EVERYDAY AT BEDTIME, Disp: 90 capsule, Rfl: 3  Consent:   NA  Disposition:   50-month follow-up sooner if needed.  Her questions and concerns were addressed to her satisfaction. She voices understanding of the recommendations provided during this encounter.    Signed, Madonna Michele HAS, [facility]  A Division of [facility] [address], [place], [place] [address]

## 2024-07-02 ENCOUNTER — Encounter: Payer: Self-pay | Admitting: Cardiology

## 2024-07-07 LAB — BASIC METABOLIC PANEL WITH GFR
BUN/Creatinine Ratio: 24 (ref 12–28)
BUN: 23 mg/dL (ref 8–27)
CO2: 24 mmol/L (ref 20–29)
Calcium: 9.6 mg/dL (ref 8.7–10.3)
Chloride: 102 mmol/L (ref 96–106)
Creatinine, Ser: 0.95 mg/dL (ref 0.57–1.00)
Glucose: 103 mg/dL — ABNORMAL HIGH (ref 70–99)
Potassium: 4.9 mmol/L (ref 3.5–5.2)
Sodium: 142 mmol/L (ref 134–144)
eGFR: 65 mL/min/1.73 (ref >59)

## 2024-07-13 ENCOUNTER — Ambulatory Visit: Payer: Self-pay | Admitting: Cardiology

## 2024-07-15 ENCOUNTER — Encounter: Payer: Self-pay | Admitting: Cardiology

## 2024-07-17 ENCOUNTER — Encounter (HOSPITAL_COMMUNITY): Payer: Self-pay

## 2024-07-17 NOTE — Telephone Encounter (Signed)
 Patient is scheduled for July 19, 2024 at 11 AM. Have her start taking metoprolol  on the 25th 1 tablet in the morning 1 tablet at night. On the 26th morning  take two tablets in the morning approximately an hour before the scan.   Dr. Djon Tith

## 2024-07-18 ENCOUNTER — Encounter: Payer: Self-pay | Admitting: Cardiology

## 2024-07-18 NOTE — Telephone Encounter (Signed)
 Follow up labs after spironolactone  noted stable renal function and electrolytes. However, if she is experiencing leg cramps and thirsty and feels is likely secondary to spironolactone  she can hold it for now.  Please have her follow-up with PCP for further evaluation of leg cramps and feeling dehydrated.  Dr. Isla Sabree

## 2024-07-19 ENCOUNTER — Ambulatory Visit (HOSPITAL_COMMUNITY)
Admission: RE | Admit: 2024-07-19 | Discharge: 2024-07-19 | Disposition: A | Discharge status: Home / Self Care | Source: Ambulatory Visit | Attending: Cardiology | Admitting: Cardiology

## 2024-07-19 DIAGNOSIS — R0609 Other forms of dyspnea: Secondary | ICD-10-CM | POA: Insufficient documentation

## 2024-07-19 DIAGNOSIS — I700 Atherosclerosis of aorta: Secondary | ICD-10-CM

## 2024-07-19 DIAGNOSIS — I2584 Coronary atherosclerosis due to calcified coronary lesion: Secondary | ICD-10-CM | POA: Diagnosis not present

## 2024-07-19 DIAGNOSIS — I2510 Atherosclerotic heart disease of native coronary artery without angina pectoris: Secondary | ICD-10-CM | POA: Insufficient documentation

## 2024-07-19 MED ORDER — NITROGLYCERIN 0.4 MG SL SUBL
0.8000 mg | SUBLINGUAL_TABLET | Freq: Once | SUBLINGUAL | Status: AC
  Administered 2024-07-19: 0.8 mg via SUBLINGUAL

## 2024-07-19 MED ORDER — IOHEXOL 350 MG/ML SOLN
100.0000 mL | Freq: Once | INTRAVENOUS | Status: AC | PRN
  Administered 2024-07-19: 100 mL via INTRAVENOUS

## 2024-10-11 ENCOUNTER — Ambulatory Visit: Admitting: Cardiology
